# Patient Record
Sex: Female | Born: 1935 | Race: White | Hispanic: No | Marital: Married | Smoking: Never smoker
Health system: Southern US, Community
[De-identification: ages and names within clinical notes are randomized; demographics above are authoritative.]

## PROBLEM LIST (undated history)

## (undated) DIAGNOSIS — R112 Nausea with vomiting, unspecified: Secondary | ICD-10-CM

## (undated) DIAGNOSIS — C50919 Malignant neoplasm of unspecified site of unspecified female breast: Secondary | ICD-10-CM

## (undated) DIAGNOSIS — D510 Vitamin B12 deficiency anemia due to intrinsic factor deficiency: Secondary | ICD-10-CM

## (undated) DIAGNOSIS — E119 Type 2 diabetes mellitus without complications: Secondary | ICD-10-CM

## (undated) DIAGNOSIS — N183 Chronic kidney disease, stage 3 unspecified: Secondary | ICD-10-CM

## (undated) DIAGNOSIS — E7800 Pure hypercholesterolemia, unspecified: Secondary | ICD-10-CM

## (undated) DIAGNOSIS — G2581 Restless legs syndrome: Secondary | ICD-10-CM

## (undated) DIAGNOSIS — I6529 Occlusion and stenosis of unspecified carotid artery: Secondary | ICD-10-CM

## (undated) DIAGNOSIS — I10 Essential (primary) hypertension: Secondary | ICD-10-CM

## (undated) DIAGNOSIS — J45909 Unspecified asthma, uncomplicated: Secondary | ICD-10-CM

## (undated) DIAGNOSIS — G479 Sleep disorder, unspecified: Secondary | ICD-10-CM

## (undated) DIAGNOSIS — G629 Polyneuropathy, unspecified: Secondary | ICD-10-CM

## (undated) DIAGNOSIS — Z9889 Other specified postprocedural states: Secondary | ICD-10-CM

## (undated) DIAGNOSIS — M1990 Unspecified osteoarthritis, unspecified site: Secondary | ICD-10-CM

## (undated) DIAGNOSIS — I340 Nonrheumatic mitral (valve) insufficiency: Secondary | ICD-10-CM

## (undated) DIAGNOSIS — T8859XA Other complications of anesthesia, initial encounter: Secondary | ICD-10-CM

## (undated) DIAGNOSIS — T4145XA Adverse effect of unspecified anesthetic, initial encounter: Secondary | ICD-10-CM

## (undated) HISTORY — DX: Essential (primary) hypertension: I10

## (undated) HISTORY — DX: Unspecified osteoarthritis, unspecified site: M19.90

## (undated) HISTORY — PX: MASTECTOMY: SHX3

## (undated) HISTORY — DX: Chronic kidney disease, stage 3 unspecified: N18.30

## (undated) HISTORY — DX: Type 2 diabetes mellitus without complications: E11.9

## (undated) HISTORY — DX: Nonrheumatic mitral (valve) insufficiency: I34.0

## (undated) HISTORY — DX: Pure hypercholesterolemia, unspecified: E78.00

## (undated) HISTORY — PX: OTHER SURGICAL HISTORY: SHX169

## (undated) HISTORY — DX: Vitamin B12 deficiency anemia due to intrinsic factor deficiency: D51.0

## (undated) HISTORY — DX: Malignant neoplasm of unspecified site of unspecified female breast: C50.919

## (undated) HISTORY — DX: Restless legs syndrome: G25.81

## (undated) HISTORY — PX: TONSILLECTOMY: SUR1361

## (undated) HISTORY — DX: Occlusion and stenosis of unspecified carotid artery: I65.29

## (undated) HISTORY — DX: Chronic kidney disease, stage 3 (moderate): N18.3

---

## 1989-03-29 DIAGNOSIS — C50919 Malignant neoplasm of unspecified site of unspecified female breast: Secondary | ICD-10-CM

## 1989-03-29 HISTORY — DX: Malignant neoplasm of unspecified site of unspecified female breast: C50.919

## 1998-05-05 ENCOUNTER — Other Ambulatory Visit: Admission: RE | Admit: 1998-05-05 | Discharge: 1998-05-05 | Payer: Self-pay | Admitting: Family Medicine

## 1998-11-14 ENCOUNTER — Encounter: Admission: RE | Admit: 1998-11-14 | Discharge: 1999-02-12 | Payer: Self-pay | Admitting: Family Medicine

## 1999-08-18 ENCOUNTER — Encounter: Admission: RE | Admit: 1999-08-18 | Discharge: 1999-08-18 | Payer: Self-pay | Admitting: Family Medicine

## 1999-08-18 ENCOUNTER — Encounter: Payer: Self-pay | Admitting: Family Medicine

## 1999-10-08 ENCOUNTER — Encounter: Admission: RE | Admit: 1999-10-08 | Discharge: 1999-10-08 | Payer: Self-pay | Admitting: Family Medicine

## 1999-10-08 ENCOUNTER — Encounter: Payer: Self-pay | Admitting: Family Medicine

## 2000-07-13 ENCOUNTER — Encounter: Payer: Self-pay | Admitting: Family Medicine

## 2000-07-13 ENCOUNTER — Encounter: Admission: RE | Admit: 2000-07-13 | Discharge: 2000-07-13 | Payer: Self-pay | Admitting: Family Medicine

## 2000-10-18 ENCOUNTER — Encounter: Payer: Self-pay | Admitting: Family Medicine

## 2000-10-18 ENCOUNTER — Encounter: Admission: RE | Admit: 2000-10-18 | Discharge: 2000-10-18 | Payer: Self-pay | Admitting: Family Medicine

## 2001-04-07 ENCOUNTER — Encounter: Payer: Self-pay | Admitting: Family Medicine

## 2001-04-07 ENCOUNTER — Encounter: Admission: RE | Admit: 2001-04-07 | Discharge: 2001-04-07 | Payer: Self-pay | Admitting: Family Medicine

## 2001-10-30 ENCOUNTER — Encounter: Admission: RE | Admit: 2001-10-30 | Discharge: 2001-10-30 | Payer: Self-pay | Admitting: Family Medicine

## 2001-10-30 ENCOUNTER — Encounter: Payer: Self-pay | Admitting: Family Medicine

## 2001-11-29 ENCOUNTER — Other Ambulatory Visit: Admission: RE | Admit: 2001-11-29 | Discharge: 2001-11-29 | Payer: Self-pay | Admitting: Family Medicine

## 2002-07-31 ENCOUNTER — Encounter: Payer: Self-pay | Admitting: Orthopedic Surgery

## 2002-08-06 ENCOUNTER — Inpatient Hospital Stay (HOSPITAL_COMMUNITY): Admission: RE | Admit: 2002-08-06 | Discharge: 2002-08-10 | Payer: Self-pay | Admitting: Orthopedic Surgery

## 2003-02-11 ENCOUNTER — Ambulatory Visit (HOSPITAL_COMMUNITY): Admission: RE | Admit: 2003-02-11 | Discharge: 2003-02-11 | Payer: Self-pay | Admitting: Ophthalmology

## 2003-05-15 ENCOUNTER — Encounter: Admission: RE | Admit: 2003-05-15 | Discharge: 2003-05-15 | Payer: Self-pay | Admitting: Cardiology

## 2004-03-04 ENCOUNTER — Encounter: Admission: RE | Admit: 2004-03-04 | Discharge: 2004-03-04 | Payer: Self-pay | Admitting: Family Medicine

## 2004-07-17 ENCOUNTER — Encounter: Admission: RE | Admit: 2004-07-17 | Discharge: 2004-07-17 | Payer: Self-pay | Admitting: Family Medicine

## 2005-03-30 ENCOUNTER — Other Ambulatory Visit: Admission: RE | Admit: 2005-03-30 | Discharge: 2005-03-30 | Payer: Self-pay | Admitting: Family Medicine

## 2005-06-08 ENCOUNTER — Encounter: Admission: RE | Admit: 2005-06-08 | Discharge: 2005-06-08 | Payer: Self-pay | Admitting: Family Medicine

## 2005-06-23 ENCOUNTER — Ambulatory Visit: Payer: Self-pay | Admitting: Internal Medicine

## 2005-07-30 ENCOUNTER — Ambulatory Visit: Payer: Self-pay | Admitting: Internal Medicine

## 2006-05-26 ENCOUNTER — Encounter: Admission: RE | Admit: 2006-05-26 | Discharge: 2006-05-26 | Payer: Self-pay | Admitting: Family Medicine

## 2006-11-09 ENCOUNTER — Encounter: Admission: RE | Admit: 2006-11-09 | Discharge: 2006-11-09 | Payer: Self-pay | Admitting: Cardiology

## 2007-05-08 ENCOUNTER — Encounter: Admission: RE | Admit: 2007-05-08 | Discharge: 2007-05-08 | Payer: Self-pay | Admitting: Family Medicine

## 2007-05-10 ENCOUNTER — Encounter: Admission: RE | Admit: 2007-05-10 | Discharge: 2007-05-10 | Payer: Self-pay | Admitting: Cardiology

## 2007-08-14 ENCOUNTER — Other Ambulatory Visit: Admission: RE | Admit: 2007-08-14 | Discharge: 2007-08-14 | Payer: Self-pay | Admitting: Family Medicine

## 2007-08-14 ENCOUNTER — Encounter: Admission: RE | Admit: 2007-08-14 | Discharge: 2007-08-14 | Payer: Self-pay | Admitting: Family Medicine

## 2007-10-11 ENCOUNTER — Encounter: Admission: RE | Admit: 2007-10-11 | Discharge: 2007-10-11 | Payer: Self-pay | Admitting: Specialist

## 2008-12-08 ENCOUNTER — Encounter: Admission: RE | Admit: 2008-12-08 | Discharge: 2008-12-08 | Payer: Self-pay | Admitting: Specialist

## 2009-02-04 ENCOUNTER — Encounter (HOSPITAL_COMMUNITY): Admission: RE | Admit: 2009-02-04 | Discharge: 2009-03-28 | Payer: Self-pay | Admitting: Family Medicine

## 2009-02-27 ENCOUNTER — Ambulatory Visit: Payer: Self-pay | Admitting: Vascular Surgery

## 2009-05-15 ENCOUNTER — Ambulatory Visit (HOSPITAL_COMMUNITY): Admission: RE | Admit: 2009-05-15 | Discharge: 2009-05-15 | Payer: Self-pay | Admitting: Nephrology

## 2009-05-28 ENCOUNTER — Encounter: Admission: RE | Admit: 2009-05-28 | Discharge: 2009-05-28 | Payer: Self-pay | Admitting: Family Medicine

## 2009-09-02 ENCOUNTER — Ambulatory Visit: Payer: Self-pay | Admitting: Vascular Surgery

## 2010-04-20 ENCOUNTER — Encounter
Admission: RE | Admit: 2010-04-20 | Discharge: 2010-04-20 | Payer: Self-pay | Source: Home / Self Care | Attending: Family Medicine | Admitting: Family Medicine

## 2010-04-22 ENCOUNTER — Inpatient Hospital Stay (HOSPITAL_COMMUNITY)
Admission: EM | Admit: 2010-04-22 | Discharge: 2010-04-25 | Payer: Self-pay | Source: Home / Self Care | Attending: Internal Medicine | Admitting: Internal Medicine

## 2010-04-22 LAB — BASIC METABOLIC PANEL
CO2: 22 mEq/L (ref 19–32)
Calcium: 9.4 mg/dL (ref 8.4–10.5)
Creatinine, Ser: 1.31 mg/dL — ABNORMAL HIGH (ref 0.4–1.2)
GFR calc Af Amer: 47 mL/min — ABNORMAL LOW (ref >60)
GFR calc non Af Amer: 39 mL/min — ABNORMAL LOW (ref >60)
Sodium: 136 mEq/L (ref 135–145)

## 2010-04-22 LAB — HEPATIC FUNCTION PANEL
AST: 19 U/L (ref 0–37)
Albumin: 2.6 g/dL — ABNORMAL LOW (ref 3.5–5.2)
Bilirubin, Direct: 0.2 mg/dL (ref 0.0–0.3)
Total Bilirubin: 0.6 mg/dL (ref 0.3–1.2)

## 2010-04-22 LAB — CBC
Hemoglobin: 11.4 g/dL — ABNORMAL LOW (ref 12.0–15.0)
MCH: 27 pg (ref 26.0–34.0)
Platelets: 393 10*3/uL (ref 150–400)
RBC: 4.23 MIL/uL (ref 3.87–5.11)
WBC: 17.6 10*3/uL — ABNORMAL HIGH (ref 4.0–10.5)

## 2010-04-22 LAB — GLUCOSE, CAPILLARY
Glucose-Capillary: 328 mg/dL — ABNORMAL HIGH (ref 70–99)
Glucose-Capillary: 199 mg/dL — ABNORMAL HIGH (ref 70–99)

## 2010-04-22 LAB — DIFFERENTIAL
Basophils Absolute: 0 10*3/uL (ref 0.0–0.1)
Eosinophils Relative: 4 % (ref 0–5)
Lymphocytes Relative: 11 % — ABNORMAL LOW (ref 12–46)
Monocytes Relative: 7 % (ref 3–12)
Neutrophils Relative %: 78 % — ABNORMAL HIGH (ref 43–77)
Smear Review: ADEQUATE

## 2010-04-23 LAB — GLUCOSE, CAPILLARY: Glucose-Capillary: 154 mg/dL — ABNORMAL HIGH (ref 70–99)

## 2010-04-23 LAB — CBC
HCT: 29.5 % — ABNORMAL LOW (ref 36.0–46.0)
MCHC: 32.5 g/dL (ref 30.0–36.0)
MCV: 80.8 fL (ref 78.0–100.0)
Platelets: 401 10*3/uL — ABNORMAL HIGH (ref 150–400)
RDW: 14 % (ref 11.5–15.5)

## 2010-04-23 LAB — HEMOGLOBIN A1C
Hgb A1c MFr Bld: 7.2 % — ABNORMAL HIGH (ref <5.7)
Mean Plasma Glucose: 160 mg/dL — ABNORMAL HIGH (ref <117)

## 2010-04-23 LAB — BASIC METABOLIC PANEL
BUN: 17 mg/dL (ref 6–23)
Calcium: 8.9 mg/dL (ref 8.4–10.5)
Creatinine, Ser: 1.24 mg/dL — ABNORMAL HIGH (ref 0.4–1.2)
GFR calc non Af Amer: 42 mL/min — ABNORMAL LOW (ref >60)
Glucose, Bld: 153 mg/dL — ABNORMAL HIGH (ref 70–99)

## 2010-04-23 LAB — MAGNESIUM: Magnesium: 1.9 mg/dL (ref 1.5–2.5)

## 2010-04-24 LAB — BASIC METABOLIC PANEL
Calcium: 8.6 mg/dL (ref 8.4–10.5)
GFR calc Af Amer: 51 mL/min — ABNORMAL LOW (ref >60)
GFR calc non Af Amer: 42 mL/min — ABNORMAL LOW (ref >60)
Potassium: 4.5 mEq/L (ref 3.5–5.1)
Sodium: 139 mEq/L (ref 135–145)

## 2010-04-24 LAB — GLUCOSE, CAPILLARY
Glucose-Capillary: 141 mg/dL — ABNORMAL HIGH (ref 70–99)
Glucose-Capillary: 140 mg/dL — ABNORMAL HIGH (ref 70–99)
Glucose-Capillary: 281 mg/dL — ABNORMAL HIGH (ref 70–99)

## 2010-04-24 LAB — IRON AND TIBC
Iron: 21 ug/dL — ABNORMAL LOW (ref 42–135)
Saturation Ratios: 11 % — ABNORMAL LOW (ref 20–55)
TIBC: 186 ug/dL — ABNORMAL LOW (ref 250–470)

## 2010-04-24 LAB — CBC
Hemoglobin: 9.6 g/dL — ABNORMAL LOW (ref 12.0–15.0)
RBC: 3.61 MIL/uL — ABNORMAL LOW (ref 3.87–5.11)

## 2010-04-24 NOTE — H&P (Addendum)
[name], [name] NO.:  [id number]  MEDICAL RECORD NO.:  [id number]          PATIENT TYPE:  EMS  LOCATION:  MAJO                         FACILITY:  [facility]  PHYSICIAN:  Vania Rea, M.D. DATE OF BIRTH:  1926/09/25  DATE OF ADMISSION:  04/22/2010 DATE OF DISCHARGE:                             HISTORY & PHYSICAL   PRIMARY CARE PHYSICIAN:  Dr. Lupita Raider.  CHIEF COMPLAINT:  Persistent cough.  HISTORY OF PRESENT ILLNESS:  This is an 77 year old Caucasian lady with history of diabetes and hypertension, who has been having cough and shortness of breath for the past 4 to 5 days.  She visited her primary care physician 3 days ago, was started on Zithromax, but has been getting progressively worse and presented to the Emergency Room today for reevaluation.  The patient had a chest x-ray done 2 days ago, which showed patchy air space disease bilaterally is suggestive of pneumonia. Today, the patient had a repeat chest x-ray, which shows worsening of pneumonia like consolidation.  The patient's shortness of breath and cough is accompanied by a left lower chest pleuritic pain and she has difficulty expectorating anything.  She has been also getting progressively weak, notes that her blood sugars have been going out of control.  There is no current family illness or history of sickness.  A few years ago, she did have an episode of pneumonia, which took a long time to clear.  PAST MEDICAL HISTORY:  Diabetes, hypertension, diabetic neuropathy, diabetic nephropathy, hyperlipidemia, subcritical carotid artery stenosis.  MEDICATIONS: 1. Simvastatin 20 mg daily. 2. Lopressor 25 mg twice daily. 3. Diovan 160 mg twice daily. 4. Lotrel 10/40 one tablet twice daily. 5. Clonidine 0.3 mg 1/2 tablet twice daily. 6. Lasix 80 mg each morning. 7. Potassium 20 mEq daily. 8. Metformin 1000 mg twice daily. 9. Novolin N 30 units each morning. 10.Aspirin 81 mg  daily. 11.Vitamin D 2000 units daily. 12.Citracal maximum daily. 13.Hydroxyzine 25 mg at bedtime as necessary. 14.Neurontin 100 mg 3 times daily. 15.Vitamin B12 injections 1000 mcg monthly.  ALLERGIES:  SHELLFISH.  SOCIAL HISTORY:  No history of tobacco, alcohol, or illicit drug use. 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  FAMILY HISTORY:  Significant for mother who had diabetes.  Her father who had lymphoma.  Brother who had coronary artery disease, but now deceased from trauma.  A sister who had a brain tumor and cardiac arrhythmia with pacemaker.  Another sister with Parkinson disease.  REVIEW OF SYSTEMS:  Other than noted above significant only for chronic essential tremor involving her face and sometimes her hands, which gets worse when she is sick.  Other than this a complete review of systems is unremarkable.  PHYSICAL EXAMINATION:  GENERAL:  A very pleasant elderly Caucasian lady, sitting up in the stretcher, looking somewhat younger than her stated age, struggling to breathe somewhat. VITAL SIGNS:  Her temperature is 98.3, pulse 95, respiration 20, blood pressure 123/81.  She is saturating at 97% on room air. HEENT:  Her pupils are round  and equal.  Mucous membranes pink. Anicteric. NECK:  No cervical lymphadenopathy or thyromegaly.  No carotid bruit. No jugular venous distention. CHEST:  She has coarse crackles at both bases bilaterally. CARDIOVASCULAR:  Regular rhythm.  No murmur. ABDOMEN:  Obese, soft and nontender.  No masses. EXTREMITIES: She has arthritic deformities of the hands and knees. Dorsalis pedis pulses are 2+ bilaterally. SKIN:  Warm and dry. CENTRAL NERVOUS SYSTEM: Cranial nerves II through XII are grossly intact.  She has no focal lateralizing signs.  LABORATORY DATA:  Her white count is elevated 17.6, hemoglobin is low at 11.4, platelets 393.  Absolute  neutrophil count is 13.8.  Toxic granulations are noted on the smear.  Lactic acid is normal at 1.6.  The sodium is 136, potassium 4.6, chloride 102, CO2 of 22.  Her glucose is elevated 344, BUN 18, creatinine is 1.3.  Her calcium is 9.4.  Liver functions show an albumin of 2.6, but is otherwise unremarkable.  Her total protein is 8.1, which is somewhat elevated considering her albumin.  Chest x-ray done today compared to the chest x-ray done 2 days ago shows a persistent right upper and left lobe airspace disease, worrisome for pneumonia.  No interval change.  ASSESSMENT: 1. Community-acquired bilobar pneumonia, failed outpatient     therapy. 2. Pleurisy. 3. Hypertension. 4. Diabetes type 2, uncontrolled. 5. Diabetic nephropathy. 6. Diabetic neuropathy. 7. History of carotid artery stenosis. 8. Hyperlipidemia. 9. Anemia with elevated globulin-albumin ratio.  PLAN:  We will admit this lady for inpatient management of her pneumonia.  She is already received vancomycin and Zosyn, but we will continue treatment with Avelox and the mucolytics.  We will attempt to get a sputum culture. We will continue her outpatient antidiabetic treatment and we will add a sensitive sliding scale.  Because of her anemia and elevated BUN creatinine ratio, we will go ahead and get a serum protein electrophoresis since the dehydration with a ratio should have been maintained.  Other plans as per orders.     Vania Rea, M.D.     LC/MEDQ  D:  04/22/2010  T:  04/22/2010  Job:  [id number]  cc:   Lupita Raider, M.D.  Electronically Signed by Vania Rea M.D. on 04/24/2010 03:18:32 AM

## 2010-04-25 LAB — BASIC METABOLIC PANEL
Chloride: 107 mEq/L (ref 96–112)
Creatinine, Ser: 1.27 mg/dL — ABNORMAL HIGH (ref 0.4–1.2)
GFR calc Af Amer: 49 mL/min — ABNORMAL LOW (ref >60)
Potassium: 5.2 mEq/L — ABNORMAL HIGH (ref 3.5–5.1)

## 2010-04-25 LAB — GLUCOSE, CAPILLARY: Glucose-Capillary: 133 mg/dL — ABNORMAL HIGH (ref 70–99)

## 2010-04-25 LAB — CBC
MCH: 26.3 pg (ref 26.0–34.0)
Platelets: 419 10*3/uL — ABNORMAL HIGH (ref 150–400)
RBC: 3.5 MIL/uL — ABNORMAL LOW (ref 3.87–5.11)
WBC: 12.2 10*3/uL — ABNORMAL HIGH (ref 4.0–10.5)

## 2010-04-27 LAB — PROTEIN ELECTROPH W RFLX QUANT IMMUNOGLOBULINS
Alpha-2-Globulin: 22.2 % — ABNORMAL HIGH (ref 7.1–11.8)
Beta Globulin: 6.9 % (ref 4.7–7.2)
Gamma Globulin: 16.4 % (ref 11.1–18.8)
M-Spike, %: NOT DETECTED g/dL

## 2010-04-27 LAB — IGG, IGA, IGM: IgG (Immunoglobin G), Serum: 1170 mg/dL (ref 694–1618)

## 2010-04-28 LAB — CULTURE, BLOOD (ROUTINE X 2)
Culture  Setup Time: 201201252009
Culture  Setup Time: 201201252009
Culture: NO GROWTH

## 2010-05-10 NOTE — Discharge Summary (Signed)
[name], [name] NO.:  [id number]  MEDICAL RECORD NO.:  [id number]          PATIENT TYPE:  INP  LOCATION:  5126                         FACILITY:  [facility]  PHYSICIAN:  Brendia Sacks, MD    DATE OF BIRTH:  26-Oct-1926  DATE OF ADMISSION:  04/22/2010 DATE OF DISCHARGE:  04/25/2010                              DISCHARGE SUMMARY   PRIMARY CARE PHYSICIAN:  Lupita Raider, M.D.  CONDITION ON DISCHARGE:  Improved.  DISCHARGE DIAGNOSES: 1. Community-acquired pneumonia. 2. Chronic kidney disease, stable. 3. Diabetes mellitus type 2, uncontrolled by hemoglobin A1c; stable as     an inpatient. 4. Anemia suggestive of chronic disease with possibility of     concomitant iron deficiency, stable. 5. Hypertension, stable. 6. Mild hyperkalemia.  HISTORY OF PRESENT ILLNESS:  This is an 77 year old woman who presented to the emergency room for reevaluation of shortness of breath.  She had been recently seen by her primary care physician and started on antibiotics, but had progressively worsened.  HOSPITAL COURSE: 1. Community-acquired pneumonia.  The patient was admitted to medical     floor, started on IV Avelox.  She has had rapid improvement in her     symptoms.  Has been ambulating without difficulty and has no     hypoxia.  She is now stable for discharge. 2. Chronic kidney disease.  Last comparison laboratory studies are     from 2004.  She is followed by Dr. Darrick Penna and can continue to     follow up with him in the outpatient setting.  She had been on both     an ACE inhibitor and an ARB as well as Lasix for some time. 3. Mild hyperkalemia.  I have asked her to hold her potassium and ARB.     She can resume her ARB in the next few days, and it was suggested     to repeat basic metabolic panel prior to restarting her potassium. 4. Diabetes mellitus type 2, uncontrolled by hemoglobin A1c.  This has     remained stable during this hospitalization from a  capillary blood     sugar standpoint.  She will continue on NPH.  I have discontinued     her metformin as in patients over 80 have increased risk of lactic     acidosis. 5. Anemia.  Laboratory studies mixed, suggesting chronic disease and     potentially iron deficiency.  Place her on empiric iron and have     her follow up in the outpatient setting. 6. Hypertension has remained stable.  PROCEDURES:  None.  IMAGING:  Chest x-ray, January 25:  Persistent right upper and right lower lobe airspace disease, worrisome for pneumonia.  No interval change.  MICROBIOLOGY:  Blood cultures x2, January 25:  No growth to date.  PERTINENT LABORATORY STUDIES: 1. Hemoglobin A1c 7.2. 2. CBC notable for white blood cell count 17.6 on admission, on     discharge 12.2 and trending down.  Hemoglobin stable at 9.2.     Review of her record does demonstrate a history of anemia dating     back to  at least 2004. 3. Capillary blood sugars under fair control. 4. Basic metabolic panel notable for potassium of 5.2 on discharge,     creatinine is stable at 1.27, and BUN is within normal limits.  OBJECTIVE:  GENERAL:  On exam at discharge, patient is feeling well.  No complaints.  She would like to go home. VITAL SIGNS:  Afebrile, temperature is 97.9, pulse 64, respirations 24, blood pressure 144/53, and satting 91% on room air.  CARDIOVASCULAR: Regular rate and rhythm.  No murmur, rub, or gallop. RESPIRATORY:  Clear to auscultation bilaterally.  No wheezes, rales, or rhonchi.  Normal respiratory effort.  No lower extremity edema.  DISCHARGE INSTRUCTIONS:  The patient will be discharged home today.  She has been evaluated by Physical Therapy, and it has been recommended she have outpatient physical therapy and a rolling walker for discharge.  DIET:  Heart-healthy diabetic diet.  FOLLOWUP:  With primary care physician, Dr. Cam Hai in about 7 or 10 days.  DISCHARGE MEDICATIONS: 1. Avelox 400 mg,  to complete a 10-day course. 2. Iron 325 mg p.o. daily. 3. Aspirin 81 mg p.o. daily. 4. Clonidine 0.3 mg one-half tablet p.o. daily. 5. Hydrochlorothiazide 25 mg p.o. daily. 6. Lasix 80 mg p.o. daily. 7. Lopressor 25 mg p.o. b.i.d. 8. Lotrel 10/40 p.o. b.i.d. 9. Neurontin 100 mg p.o. t.i.d. 10.Novolin N 30 units subcu daily. 11.Potassium chloride, on hold until followup with primary care     physician. 12.Simvastatin 20 mg p.o. daily. 13.Vitamin B12 injections monthly. 14.Vitamin D 2000 units daily.  DISCONTINUED MEDICATIONS: 1. Metformin, see rationale above. 2. Potassium chloride, see rationale above. 3. Valsartan, until followup with primary care physician.  THINGS TO FOLLOW UP IN THE OUTPATIENT SETTING: 1. Suggest repeat basic metabolic panel and to continue following her     potassium and kidney functions as     indicated. 2. Continue diabetic care.  Time coordinating discharge is 30 minutes.     Brendia Sacks, MD     DG/MEDQ  D:  04/25/2010  T:  04/26/2010  Job:  [id number]  cc:   Lupita Raider, M.D.  Electronically Signed by Brendia Sacks  on 05/10/2010 03:57:39 PM

## 2010-05-19 ENCOUNTER — Other Ambulatory Visit: Payer: Self-pay | Admitting: Family Medicine

## 2010-05-19 ENCOUNTER — Ambulatory Visit
Admission: RE | Admit: 2010-05-19 | Discharge: 2010-05-19 | Disposition: A | Discharge status: Home / Self Care | Payer: Medicare Other | Source: Ambulatory Visit | Attending: Family Medicine | Admitting: Family Medicine

## 2010-05-19 DIAGNOSIS — R52 Pain, unspecified: Secondary | ICD-10-CM

## 2010-06-05 ENCOUNTER — Other Ambulatory Visit: Payer: Self-pay | Admitting: Family Medicine

## 2010-06-05 ENCOUNTER — Ambulatory Visit
Admission: RE | Admit: 2010-06-05 | Discharge: 2010-06-05 | Disposition: A | Discharge status: Home / Self Care | Payer: Medicare Other | Source: Ambulatory Visit | Attending: Family Medicine | Admitting: Family Medicine

## 2010-06-05 DIAGNOSIS — J189 Pneumonia, unspecified organism: Secondary | ICD-10-CM

## 2010-08-11 NOTE — Assessment & Plan Note (Signed)
OFFICE VISIT   [name], [name]  DOB:  1926-05-07                                       09/02/2009  JXBJY#:[id number]   CHIEF COMPLAINT:  Follow-up mild to moderate carotid stenosis.   Patient is an 77 year old female with known moderate bilateral carotid  stenosis which has been asymptomatic.  She has not had any surgical  procedures of her carotids.  She has a history of diabetes and high  blood pressure.   All medications were reviewed from the list that she brought with her  today from a visit with her primary care physician 1 month ago.   PHYSICAL EXAMINATION:  A well-developed, well-nourished woman in no  acute distress.  Her heart rate was 65.  Her sats were 97%, and her  respiratory rate was 10.  She was alert and oriented x3.  She had good  equal strength in bilateral upper and lower extremities.  She had no  tongue deviation.  No facial droop.  Her speech was normal.   Six-month carotid studies showed end-diastolic volume of 33 on the right  and 60 on the left with a 40% to 59% stenosis on the right and a 60% and  79% stenosis on the left.  Left is slightly higher from a previous  study.   ASSESSMENT:  Bilateral moderate carotid stenosis, left end-diastolic  volume slightly higher, with no symptoms of stroke.   PLAN:  Follow up in 6 months with repeat vascular lab studies.   Della Goo, PA-C   Quita Skye. Hart [place], M.D.  Electronically Signed   RR/MEDQ  D:  09/02/2009  T:  09/02/2009  Job:  [id number]

## 2010-08-11 NOTE — Consult Note (Signed)
NEW PATIENT CONSULTATION   [name], [name]  DOB:  [birth year]                                       02/27/2009  IRJJO#:[id number]   I saw the patient in the office today in consultation concerning  bilateral carotid disease.  She was referred by Dr. Mayford Knife.  This is a  pleasant 77 year old woman who was found to have carotid bruits and this  prompted a carotid duplex scan which showed moderate bilateral carotid  stenoses.  Of note, she is right-handed.  She denies any history of  stroke, TIAs, expressive or receptive aphasia or amaurosis fugax.   PAST MEDICAL HISTORY:  Significant for diabetes and hypertension, both  of which are stable on her current medications.  In addition, she has  hypercholesterolemia which is well-controlled on her Zocor.  She denies  any history of previous myocardial infarction, history of congestive  heart failure or history of COPD.   PAST SURGICAL HISTORY:  Significant for recent right rotator cuff  surgery by Dr. Hayden Rasmussen.  She has also had previous right knee  surgery in approximately 2005.   FAMILY HISTORY:  There is no history of premature cardiovascular  disease.   SOCIAL HISTORY:  She is married.  She has 4 children.  She does not use  tobacco.  She does not use alcohol on a regular basis.   MEDICATIONS:  Are documented on the medical history form in her chart.   ALLERGIES:  She has no known drug allergies.   REVIEW OF SYSTEMS:  CARDIAC:  She has had no chest pain, chest pressure,  palpitations or arrhythmias.  She does admit to some mild dyspnea on  exertion.  PULMONARY:  She has had no productive cough, bronchitis, asthma or  wheezing.  NEUROLOGIC:  She has had no dizziness, blackouts, headaches or seizures.  She does state that she has a neuropathy in her right foot.  MUSCULOSKELETAL:  She does have some arthritis.  General, GI, GU, vascular, psychiatric, ENT, hematologic review of  systems is unremarkable and  is documented on the medical history form in  her chart.   PHYSICAL EXAMINATION:  General:  This is a pleasant 77 year old woman  who appears her stated age.  Vital signs:  Blood pressure 146/56, heart  rate is 65 and temperature is 98.  HEENT:  Pupils are equal, round,  reactive to light and accommodation.  Extraocular motions are intact.  Conjunctivae are normal.  Lungs:  Are clear bilaterally to auscultation  without rales, rhonchi or wheezing.  Cardiovascular:  She has bilateral  carotid bruits.  She has a regular rate and rhythm.  She has a systolic  murmur.  She has mild peripheral edema.  She has palpable radial and  femoral pulses.  She has palpable posterior tibial pulses bilaterally.  Abdomen:  Soft, nontender.  I do not appreciate an aneurysm or any  masses.  She has normal pitched bowel sounds.  Musculoskeletal:  There  are no major deformities or cyanosis.  Neurological:  There is no focal  weakness or paresthesias.  Lymphatics:  There is no significant  cervical, axillary or inguinal lymphadenopathy.   I have reviewed her carotid duplex scan which by velocity criteria show  a 40%-59% carotid stenosis on the right in the lower end of the range,  on the left side there is a  40%-59% stenosis in the higher end of that  range.  She has antegrade flow in the right vertebral artery with  retrograde flow in the left vertebral artery.   With respect to her carotid disease I have explained that for the  asymptomatic patient we would normally not recommend carotid  endarterectomy unless the stenosis progressed to greater than 80% or she  developed new neurologic symptoms.  I have recommended a followup duplex  scan in 6 months and I will see her back at that time.  She does know to  continue taking her aspirin.  She does have evidence of retrograde flow  in the left vertebral artery.  We cannot compare arm pressures as she  did not want to have her pressure taken in the right arm  because of  recent shoulder surgery.  However, this appears to be asymptomatic and I  would not recommend further workup for this unless she developed  problems with dizziness or left arm symptoms.   I will see her back in 6 months.  She knows to call sooner if she has  problems.  In the meantime she knows to continue taking her aspirin.   Di Kindle. Edilia Bo, M.D.  Electronically Signed   CSD/MEDQ  D:  02/27/2009  T:  02/28/2009  Job:  [id number]   cc:   Armanda Magic, M.D.

## 2010-08-11 NOTE — Procedures (Signed)
CAROTID DUPLEX EXAM   INDICATION:  Known carotid disease.   HISTORY:  Diabetes:  Yes.  Cardiac:  No.  Hypertension:  Yes.  Smoking:  No.  Previous Surgery:  No.  CV History:  Asymptomatic.  Amaurosis Fugax No, Paresthesias No, Hemiparesis No.                                       RIGHT             LEFT  Brachial systolic pressure:         170               170  Brachial Doppler waveforms:         Normal            Normal  Vertebral direction of flow:        Antegrade         Antegrade  DUPLEX VELOCITIES (cm/sec)  CCA peak systolic                   118               56  ECA peak systolic                   122               43  ICA peak systolic                   156               297  ICA end diastolic                   33                60  PLAQUE MORPHOLOGY:                  Calcific          Calcific  PLAQUE AMOUNT:                      Moderate          Moderate-to-severe  PLAQUE LOCATION:                    ICA, ECA          ICA, ECA   IMPRESSION:  1. The right Doppler velocities suggest 40% to 59% stenosis in the      right internal carotid artery.  2. Left Doppler velocities suggest 60% to 79% stenosis in the left      internal carotid artery.  3. Antegrade flow in bilateral vertebral arteries.   ___________________________________________  Quita Skye Hart [place], M.D.   NT/MEDQ  D:  09/02/2009  T:  09/02/2009  Job:  [id number]

## 2010-08-14 NOTE — Discharge Summary (Signed)
[name], [name]                         ACCOUNT NO.:  [id number]   MEDICAL RECORD NO.:  [id number]                   PATIENT TYPE:  INP   LOCATION:  0474                                 FACILITY:  [facility]   PHYSICIAN:  Ollen Gross, M.D.                 DATE OF BIRTH:  06/08/1926   DATE OF ADMISSION:  08/06/2002  DATE OF DISCHARGE:  08/10/2002                                 DISCHARGE SUMMARY   ADMITTING DIAGNOSES:  1. Osteoarthritis right knee.  2. Hypertension.  3. Hypercholesterolemia.  4. Osteoarthritis.  5. History of breast cancer.  6. Non-insulin-dependent diabetes mellitus.   DISCHARGE DIAGNOSES:  1. Osteoarthritis right knee status post right total knee replacement     arthroplasty.  2. Postoperative blood loss hemorrhagic anemia.  3. Status post transfusion without sequelae.  4. Postoperative hyponatremia, improved.  5. Hypertension.  6. Hypercholesterolemia.  7. Osteoarthritis.  8. History of breast cancer.  9. Non-insulin-dependent diabetes mellitus.  10.      Urinary tract infection.   PROCEDURE:  The patient was taken to the OR on Aug 06, 2002 and underwent a  right total knee arthroplasty.  Surgeon Ollen Gross, M.D.  Assistant  Alexzandrew L. Julien Girt, P.A.  Surgery under general anesthesia.  Minimal  blood loss.  Hemovac drain x1.  Tourniquet time 40 minutes at 300 mmHg.   CONSULTS:  Medicine consult, Donia Guiles, M.D.   BRIEF HISTORY:  The patient is a 77 year old female with a known history of  arthritis that has been seen by Ollen Gross, M.D. for ongoing knee pain.  She is found in the office to have end-stage osteoarthritis which has been  refractory to non-operative management including injections.  She presents  now for total knee replacement arthroplasty.   LABORATORY DATA:  CBC on admission hemoglobin 11.6, hematocrit 35.0, white  cell count 7.9, red cell count 3.17.  Differential all within normal limits.  Postoperative H&H 10.0  and 29.9.  Hemoglobin continued to drop down to 7.7  and 23.1.  Given blood.  Post transfusion hemoglobin 11.4 and 33.4.  PT/PTT  on admission 14.5 and 37, respectively.  INR 1.1.  Serial pro times  followed.  Last noted PT/INR 19.7 and 1.8.  Chemistry panel on admission:  Elevated glucose 142.  Remaining chemistry panel all within normal limits.  Serial BMETs were followed.  Sodium did drop down from 143 and 132 back up  to 136.  Glucose went up from 142 to 229, back down to 136.  BUN went from  22 to 27 back down to 9.  Urinalysis on admission was negative.  Follow-up  UA showed only small leukocyte esterase with only small hemoglobin.  Urine  culture taken on Aug 09, 2002 showed serratia marcescens.  Sensitivities in  chart.   EKG dated Aug 06, 2002:  Sinus rhythm with first degree AV block, prolonged  QT.  No old  tracings to compare.  Confirmed by Lyn Records, M.D.  Chest x-  ray dated Jul 31, 2002:  Mild hyperinflation and chronic appearing changes as  detailed.  Postoperative changes and borderline cardiomegaly.   HOSPITAL COURSE:  The patient was admitted to [facility], taken to  the OR, underwent above stated procedure without complications.  The patient  tolerated procedure well.  Was later returned to recovery room, then to  orthopedic floor for continued postoperative care.  The patient's medical  doctor is Donia Guiles, M.D.  Medical consult called Donia Guiles, M.D.  in his office.  The patient was followed by one of Donia Guiles, M.D.  partners during the hospital course.  The patient was given 24 hours of  postoperative IV antibiotics, placed on Coumadin for DVT prophylaxis.  The  patient was followed by the [facility] hospitalists, Deirdre Peer. Polite, M.D.  Hemovac drain placed at time of surgery was pulled on postoperative day one.  She did have some postoperative nausea.  Was given antiemetics.  By day two  the nausea had nearly but all resolved.  She was  given PC analgesics  initially following her procedure.  She was weaned over to p.o. medications  by postoperative day two.  She had a Marcaine in-flow drain placed at time  of surgery.  This was pulled on postoperative day two.  Laboratories were  followed during the hospital course as above.  Unfortunately, hemoglobin  dropped down to 7.7 noted on postoperative day three.  Due to its low level  she was given 2 units of blood.  Hemoglobin came back up, responded well.  The patient was initially slow to progress with physical therapy up  ambulating only 5 feet by postoperative day one and 8 feet by postoperative  day two.  By postoperative day three, however, she was up ambulating much  better and had ambulated approximately 120 feet.  She was doing well, so  well the following day, postoperative day four, she was doing much better.  Improved with her therapy and it was decided the patient would be discharged  home at that time.   DISCHARGE PLAN:  The patient discharged home on Aug 10, 2002.   DISCHARGE DIAGNOSES:  Please see above.   DISCHARGE MEDICATIONS:  1. Mepergan Fortis.  2. Coumadin.  3. Robaxin.  4. Cipro for three days.   DIET:  Diabetic diet.   FOLLOW UP:  Follow up two to two and a half weeks from surgery.  Call the  office for an appointment.   DISPOSITION:  Home.   ACTIVITY:  Full weightbearing.  Home health PT, home health nursing through  [place].  Total knee protocol.   CONDITION ON DISCHARGE:  Improved.     Alexzandrew L. Julien Girt, P.A.              Ollen Gross, M.D.    ALP/MEDQ  D:  09/20/2002  T:  09/21/2002  Job:  [id number]

## 2010-08-14 NOTE — Op Note (Signed)
NAMEMARSHAYLA, [name]                         ACCOUNT NO.:  [id number]   MEDICAL RECORD NO.:  [id number]                   PATIENT TYPE:  OIB   LOCATION:  2550                                 FACILITY:  [facility]   PHYSICIAN:  Alford Highland. Rankin, M.D.                DATE OF BIRTH:  [birth year]   DATE OF PROCEDURE:  02/11/2003  DATE OF DISCHARGE:                                 OPERATIVE REPORT   PREOPERATIVE DIAGNOSES:  1. Dense vitreous hemorrhage, right eye.  2. Progressive diabetic retinopathy, right eye.   POSTOPERATIVE DIAGNOSES:  1. Dense vitreous hemorrhage, right eye.  2. Progressive diabetic retinopathy, right eye.  3. Posterior capsular opacity, right eye.   PROCEDURES:  1. Posterior vitrectomy, Endolaser pan photocoagulation, O.D.  2. Surgical posterior capsulotomy, O.D.   SURGEON:  Alford Highland. Rankin, M.D.   ANESTHESIA:  Local retrobulbar by ________.   INDICATIONS FOR PROCEDURE:  The patient is a 77 year old woman with profound  visual loss on the basis of dense vitreous hemorrhage.  This is an attempt  to remove the vitreous opacification and induce clysis of diabetic  retinopathy with laser photocoagulation.  The patient understands the risks  of anesthesia including rare occurrence of death, plus the eye including,  but not limited to hemorrhage, infection, scarring, need for further  surgery, change in vision, loss of vision, progressive disease, were  mentioned.  Appropriate signed consent was obtained.   DESCRIPTION OF PROCEDURE:  The patient was taken to the operating room.  In  the operating room, appropriate monitors followed by mild sedation was  performed via 1% Marcaine delivered 5 cc retrobulbar and additional 5 cc  laterally via modified Darel Hong.  The periocular region was thoroughly  prepped and draped in the usual ophthalmic fashion.  A lid speculum applied.  The conjunctival peritomy was the fashioned temporally and superonasally.  Appropriate infusion  cannula was secured 3.5 mm posterior to the limbus in  the inferotemporal quadrant.  Placement in vitreous cavity verified  visually.  A superior sclerotomy was then fashioned.  A Wilds microscope was  placed into position with BIOM attached. A core vitrectomy was then begun.  Vitreous scars were trimmed 360 degrees.  All preretinal vitreous hemorrhage  removed.  Endolaser photocoagulation placed peripherally in areas previously  not needing treatment.  It was necessary to create a surgical capsulotomy so  to allow enhanced viewing.  At this time, the instrument was removed from  the eye.  The superior sclerotomy was closed with 7-0 Vicryl suture.  The  conjunctiva was closed with 7-0 Vicryl.  Subconjunctival injections of  antibiotic and steroid were applied.  The patient tolerated the procedure  well without complications.  She was taken to the outpatient area and will  be discharged home as an outpatient.  Alford Highland Rankin, M.D.   GAR/MEDQ  D:  02/11/2003  T:  02/11/2003  Job:  [id number]

## 2010-08-14 NOTE — Op Note (Signed)
NAMEKYLI, SORTER                         ACCOUNT NO.:  [id number]   MEDICAL RECORD NO.:  [id number]                   PATIENT TYPE:  INP   LOCATION:  E454                                 FACILITY:  [facility]   PHYSICIAN:  Ollen Gross, M.D.                 DATE OF BIRTH:  [birth year]   DATE OF PROCEDURE:  08/06/2002  DATE OF DISCHARGE:                                 OPERATIVE REPORT   PREOPERATIVE DIAGNOSIS:  Osteoarthritis, right knee.   POSTOPERATIVE DIAGNOSIS:  Osteoarthritis, right knee.   PROCEDURE:  Right total knee arthroplasty.   SURGEON:  Ollen Gross, M.D.   ASSISTANT:  Alexzandrew L. Julien Girt, P.A.   ANESTHESIA:  General.   ESTIMATED BLOOD LOSS:  Minimal.   DRAINS:  Hemovac x1.   COMPLICATIONS:  None.   TOURNIQUET TIME:  40 minutes at 300 mmHg.   CONDITION:  Stable to recovery.   BRIEF CLINICAL NOTE:  [name] is a 77 year old female with end-stage  arthritis of the right knee with pain refractory to nonoperative management  including injections. She has had intractable pain and presents now for a  total knee arthroplasty.   DESCRIPTION OF PROCEDURE:  After successful administration of general  anesthetic, a tourniquet was placed high on the right thigh, right lower  extremity prepped and draped in the usual sterile fashion. Extremities  wrapped in Esmarch, knee flexed, tourniquet inflated to 300 mmHg. A standard  midline incision was made with a 10 blade through the subcutaneous tissue to  the level of the fascia lata which extended well over the extensor  mechanism. A medial parapatellar arthrotomy is made and the soft tissue over  the proximal medial tibia subperiosteally elevated to the joint line with a  knife into the semimembranosus bursa with a curved osteotome. The soft  tissue over the proximal lateral tibia is elevated with attention being paid  to avoiding the patellar tendon on the tibial tubercle. The patella was  everted, knee flexed 90  degrees and ACL and PCL removed.   A drill was used to create a starting hole in the distal femur and the canal  was irrigated. A five degree right valgus alignment guide is placed and  referencing off the posterior condyles rotations marked and a block pinned  to remove 10 mm off the distal femur. Distal femoral resection is made with  an oscillating saw. A sizing block is placed and a size 3 is the most  appropriate. Rotation corresponds with the epicondylar axis and then the  size 3 cutting block is placed and the anterior and posterior cuts made. The  tibia was then subluxed forward and the menisci removed. The extramedullary  tibial alignment guide is placed referencing proximally at the medial aspect  of the tibial tubercle and distally along the second metatarsal axis of the  tibial crest. The block is pinned to remove 10 mm off the  nondeficient  lateral side. Tibial resection is made with an oscillating saw. The proximal  tibia is then repaired with the modular drill and then keel punch with a  size 3.   Femoral preparation is completed with the intercondylar and chamfer cuts.  The size 3 posterior stabilized femoral trial, size 3 mobile bearing tibial  trial and a 10 mm posterior stabilized rotating platform insert trial are  placed. Full extension is achieved with excellent varus and valgus balance  throughout full range of motion. The patella is everted, thickness is  measured to be 24 mm, free hand resection taken to 14 mm, 38 template  placed, lug holes drilled, trial patella placed and it tracks normally.  Osteophytes are then removed off the posterior femur with the femoral trial  in place. All trials are then removed and the cut bone surfaces prepared  with pulsatile lavage. The cement is mixed and once ready for implantation,  the size 3 mobile bearing tibial tray, size 3 posterior stabilized femoral  component and 38 patella are cemented into place and the patella is  held  with a clamp. A trial 10 mm posterior stabilized rotating platform insert is  placed, knee held in full extension and all extruded cement removed.  Synovectomy is also performed. Once the cement is fully hardened then the  permanent 10 mm posterior stabilized rotating platform insert is placed in  the size 3 tibial tray. The wound is then copiously irrigated with  antibiotic solution and the extensor mechanism closed over a Hemovac drain  with interrupted #1 PDS. Flexion against gravity was at 135 degrees.  Tourniquet was released with a total time of 40 minutes. The subcu was  closed with interrupted 2-0 Vicryl and subcuticular running 4-0 Monocryl.  The catheter for the Marcaine pain pump is then placed and the pump  initiated. A bulky sterile dressing was applied. Drains hooked to suction,  she was awakened and transported to recovery in stable condition.                                               Ollen Gross, M.D.    FA/MEDQ  D:  08/06/2002  T:  08/07/2002  Job:  [id number]

## 2010-08-14 NOTE — H&P (Signed)
[name], [name]                         ACCOUNT NO.:  [id number]   MEDICAL RECORD NO.:  [id number]                   PATIENT TYPE:  INP   LOCATION:  0474                                 FACILITY:  [facility]   PHYSICIAN:  Ollen Gross, M.D.                 DATE OF BIRTH:  July 24, 1926   DATE OF ADMISSION:  08/06/2002  DATE OF DISCHARGE:  08/10/2002                                HISTORY & PHYSICAL   CHIEF COMPLAINT:  Bilateral knee pain right greater than left.   HISTORY OF PRESENT ILLNESS:  The patient is a 77 year old female well known  to Ollen Gross, M.D. having a known history of bilateral knee  osteoarthritis.  She has a known history.  She comes in, seen in the office  and states that her knee pain has been getting progressively worse to the  point now where she is having difficulty getting around and also having pain  at night.  She has undergone previous injections which only provided her  temporary relief.  She is at the point now where she is having significant  edema and having problems with her mobility.  Risks and benefits of a total  knee procedure have been discussed with the patient at length.  Due to her  pain and lack of mobility she would like to proceed with surgery.   ALLERGIES:  SHELLFISH.   CURRENT MEDICATIONS:  1. Lotrel 5/20 mg twice a day.  2. Metoprolol 50 mg daily.  3. Diovan 320 mg daily.  4. Hydrochlorothiazide 25 mg twice a day.  5. She also takes Metformin 1000 twice a day.  6. Actos 45 mg.  7. She also takes hydroxizine/hydrochlorothiazide 25 mg as needed.   PAST MEDICAL HISTORY:  1. Hypertension.  2. Non-insulin-dependent diabetes mellitus.  3. Elevated cholesterol.  4. Arthritis.  5. History of breast cancer.   PAST SURGICAL HISTORY:  Right mastectomy with lymph node resection secondary  to cancer 1991.   SOCIAL HISTORY:  Married.  Retired [employer] operational officer.  Nonsmoker.  No alcohol.  Has  four children.   FAMILY HISTORY:  Diabetes with  mother and also family history of lymphoma.   REVIEW OF SYSTEMS:  GENERAL:  No fevers, chills, night sweats.  NEUROLOGIC:  No seizures, syncope, paralysis.  RESPIRATORY:  No shortness of breath,  productive cough, or hemoptysis.  CARDIOVASCULAR:  No chest pain, angina,  orthopnea.  GASTROINTESTINAL:  The patient has had some diarrhea.  No  constipation.  No blood or mucus in the stool.  No nausea or vomiting.  GENITOURINARY:  No dysuria, hematuria, discharge.  MUSCULOSKELETAL:  Pertinent to that in the knee found in history of present illness.   PHYSICAL EXAMINATION:  VITAL SIGNS:  Pulse 60, respirations 16, blood  pressure 162/62.  GENERAL:  A 77 year old white female well-nourished, well-developed, no  acute distress, alert, oriented, cooperative, pleasant at time of  examination.  HEENT:  Normocephalic, atraumatic.  Pupils round, reactive.  EOMs are  intact.  Noted to wear glasses.  NECK:  Supple.  No carotid bruits.  CHEST:  Clear to auscultation anterior/posterior chest walls.  HEART:  Regular rate and rhythm.  No murmur.  ABDOMEN:  Soft, nontender.  Bowel sounds present.  RECTAL:  Not done.  Not pertinent to present illness.  BREASTS:  Not done.  Not pertinent to present illness.  GENITALIA:  Not done.  Not pertinent to present illness.  EXTREMITIES:  Significant to the right knee.  Right knee shows 0-120 degrees  range of motion, moderate crepitus, no instability, diffuse tenderness.   IMPRESSION:  1. Osteoarthritis right knee.  2. Hypertension.  3. Non-insulin-dependent diabetes mellitus.  4. Arthritis.  5. History of breast cancer.  6. Hypercholesterolemia.   PLAN:  The patient will be admitted to [facility] to undergo a  right total knee replacement arthroplasty.  Surgery will be performed by  Ollen Gross, M.D.     Alexzandrew L. Julien Girt, P.A.              Ollen Gross, M.D.    ALP/MEDQ  D:  09/20/2002  T:  09/20/2002  Job:  [id number]

## 2010-10-13 ENCOUNTER — Encounter (INDEPENDENT_AMBULATORY_CARE_PROVIDER_SITE_OTHER): Payer: Medicare Other | Admitting: Vascular Surgery

## 2010-10-13 DIAGNOSIS — I6529 Occlusion and stenosis of unspecified carotid artery: Secondary | ICD-10-CM

## 2010-10-14 NOTE — Assessment & Plan Note (Signed)
OFFICE VISIT  NASTEHO, GLANTZ DOB:  [birth year]                                       10/13/2010 ZOXWR#:[id number]  Patient presents today for continued followup of her asymptomatic carotid stenosis.  This is my first visit with her.  She has been seen in the past, dating back to 2007, by Dr. Cari Caraway.  She had a recent carotid duplex in [facility] on 09/02/10, and I have this for review.  Patient reports no change from a neurologic standpoint, specifically denying amaurosis fugax, transient ischemic attack, or stroke.  She reports that she has been stable from a cardiac standpoint.  On physical exam today, a well-developed and well-nourished white female appearing her age in no acute distress.  Blood pressure on the right is 172/67 and on the left 124/73.  Heart rate is 54, respirations 20. HEENT is normal. Carotid arteries are without bruits bilaterally. Radial pulses are 2+ bilaterally.  Musculoskeletal shows no major deformity or cyanosis.  Neurologic:  No focal weakness or paresthesias. Skin without ulcers or rashes.  I reviewed her duplex with her.  This shows no change over time in the 50% to 69% stenosis prevention.  I discussed this at length with patient.  She does see Armanda Magic at 6- month interval from a cardiac standpoint and is having her 53-month interval duplex done at [facility].  I feel it is appropriate for the followup to continue there.  I explained to patient, and she will notify us should she develop any neurologic deficits.  Otherwise we will see her should she develop deficits or have progression of her asymptomatic disease.  Otherwise we will have continue her duplexes at [facility], as is currently being done and see [place] on an as-needed basis.    Larina Earthly, M.D. Electronically Signed  TFE/MEDQ  D:  10/13/2010  T:  10/14/2010  Job:  [id number]  cc:   Armanda Magic, M.D.

## 2010-11-20 ENCOUNTER — Other Ambulatory Visit: Payer: Self-pay | Admitting: Family Medicine

## 2010-11-20 DIAGNOSIS — Z1231 Encounter for screening mammogram for malignant neoplasm of breast: Secondary | ICD-10-CM

## 2010-11-20 DIAGNOSIS — Z9011 Acquired absence of right breast and nipple: Secondary | ICD-10-CM

## 2010-12-03 ENCOUNTER — Ambulatory Visit
Admission: RE | Admit: 2010-12-03 | Discharge: 2010-12-03 | Disposition: A | Discharge status: Home / Self Care | Payer: Medicare Other | Source: Ambulatory Visit | Attending: Family Medicine | Admitting: Family Medicine

## 2010-12-03 DIAGNOSIS — Z1231 Encounter for screening mammogram for malignant neoplasm of breast: Secondary | ICD-10-CM

## 2010-12-03 DIAGNOSIS — Z9011 Acquired absence of right breast and nipple: Secondary | ICD-10-CM

## 2011-04-07 DIAGNOSIS — I6529 Occlusion and stenosis of unspecified carotid artery: Secondary | ICD-10-CM | POA: Diagnosis not present

## 2011-04-07 DIAGNOSIS — E782 Mixed hyperlipidemia: Secondary | ICD-10-CM | POA: Diagnosis not present

## 2011-04-07 DIAGNOSIS — Z1331 Encounter for screening for depression: Secondary | ICD-10-CM | POA: Diagnosis not present

## 2011-04-07 DIAGNOSIS — E1139 Type 2 diabetes mellitus with other diabetic ophthalmic complication: Secondary | ICD-10-CM | POA: Diagnosis not present

## 2011-04-07 DIAGNOSIS — J449 Chronic obstructive pulmonary disease, unspecified: Secondary | ICD-10-CM | POA: Diagnosis not present

## 2011-04-07 DIAGNOSIS — E1129 Type 2 diabetes mellitus with other diabetic kidney complication: Secondary | ICD-10-CM | POA: Diagnosis not present

## 2011-04-07 DIAGNOSIS — D510 Vitamin B12 deficiency anemia due to intrinsic factor deficiency: Secondary | ICD-10-CM | POA: Diagnosis not present

## 2011-04-20 DIAGNOSIS — N189 Chronic kidney disease, unspecified: Secondary | ICD-10-CM | POA: Diagnosis not present

## 2011-05-07 DIAGNOSIS — D510 Vitamin B12 deficiency anemia due to intrinsic factor deficiency: Secondary | ICD-10-CM | POA: Diagnosis not present

## 2011-05-20 DIAGNOSIS — E1139 Type 2 diabetes mellitus with other diabetic ophthalmic complication: Secondary | ICD-10-CM | POA: Diagnosis not present

## 2011-05-20 DIAGNOSIS — H31009 Unspecified chorioretinal scars, unspecified eye: Secondary | ICD-10-CM | POA: Diagnosis not present

## 2011-05-20 DIAGNOSIS — H43399 Other vitreous opacities, unspecified eye: Secondary | ICD-10-CM | POA: Diagnosis not present

## 2011-05-20 DIAGNOSIS — H43819 Vitreous degeneration, unspecified eye: Secondary | ICD-10-CM | POA: Diagnosis not present

## 2011-05-20 DIAGNOSIS — E11359 Type 2 diabetes mellitus with proliferative diabetic retinopathy without macular edema: Secondary | ICD-10-CM | POA: Diagnosis not present

## 2011-06-04 DIAGNOSIS — N189 Chronic kidney disease, unspecified: Secondary | ICD-10-CM | POA: Diagnosis not present

## 2011-06-04 DIAGNOSIS — D510 Vitamin B12 deficiency anemia due to intrinsic factor deficiency: Secondary | ICD-10-CM | POA: Diagnosis not present

## 2011-06-23 DIAGNOSIS — H0230 Blepharochalasis unspecified eye, unspecified eyelid: Secondary | ICD-10-CM | POA: Diagnosis not present

## 2011-06-23 DIAGNOSIS — E11319 Type 2 diabetes mellitus with unspecified diabetic retinopathy without macular edema: Secondary | ICD-10-CM | POA: Diagnosis not present

## 2011-06-23 DIAGNOSIS — E119 Type 2 diabetes mellitus without complications: Secondary | ICD-10-CM | POA: Diagnosis not present

## 2011-06-23 DIAGNOSIS — Z961 Presence of intraocular lens: Secondary | ICD-10-CM | POA: Diagnosis not present

## 2011-07-08 DIAGNOSIS — E785 Hyperlipidemia, unspecified: Secondary | ICD-10-CM | POA: Diagnosis not present

## 2011-07-08 DIAGNOSIS — D510 Vitamin B12 deficiency anemia due to intrinsic factor deficiency: Secondary | ICD-10-CM | POA: Diagnosis not present

## 2011-07-08 DIAGNOSIS — D649 Anemia, unspecified: Secondary | ICD-10-CM | POA: Diagnosis not present

## 2011-07-08 DIAGNOSIS — E119 Type 2 diabetes mellitus without complications: Secondary | ICD-10-CM | POA: Diagnosis not present

## 2011-07-08 DIAGNOSIS — I10 Essential (primary) hypertension: Secondary | ICD-10-CM | POA: Diagnosis not present

## 2011-07-08 DIAGNOSIS — E213 Hyperparathyroidism, unspecified: Secondary | ICD-10-CM | POA: Diagnosis not present

## 2011-07-16 DIAGNOSIS — M1710 Unilateral primary osteoarthritis, unspecified knee: Secondary | ICD-10-CM | POA: Diagnosis not present

## 2011-07-22 DIAGNOSIS — I129 Hypertensive chronic kidney disease with stage 1 through stage 4 chronic kidney disease, or unspecified chronic kidney disease: Secondary | ICD-10-CM | POA: Diagnosis not present

## 2011-07-26 DIAGNOSIS — J441 Chronic obstructive pulmonary disease with (acute) exacerbation: Secondary | ICD-10-CM | POA: Diagnosis not present

## 2011-07-30 ENCOUNTER — Other Ambulatory Visit: Payer: Self-pay | Admitting: Family Medicine

## 2011-07-30 ENCOUNTER — Ambulatory Visit
Admission: RE | Admit: 2011-07-30 | Discharge: 2011-07-30 | Disposition: A | Discharge status: Home / Self Care | Payer: Medicare Other | Source: Ambulatory Visit | Attending: Family Medicine | Admitting: Family Medicine

## 2011-07-30 DIAGNOSIS — J449 Chronic obstructive pulmonary disease, unspecified: Secondary | ICD-10-CM

## 2011-07-30 DIAGNOSIS — R079 Chest pain, unspecified: Secondary | ICD-10-CM | POA: Diagnosis not present

## 2011-07-30 DIAGNOSIS — R05 Cough: Secondary | ICD-10-CM | POA: Diagnosis not present

## 2011-08-05 DIAGNOSIS — D510 Vitamin B12 deficiency anemia due to intrinsic factor deficiency: Secondary | ICD-10-CM | POA: Diagnosis not present

## 2011-08-28 DIAGNOSIS — I6529 Occlusion and stenosis of unspecified carotid artery: Secondary | ICD-10-CM

## 2011-08-28 HISTORY — DX: Occlusion and stenosis of unspecified carotid artery: I65.29

## 2011-08-31 DIAGNOSIS — I10 Essential (primary) hypertension: Secondary | ICD-10-CM | POA: Diagnosis not present

## 2011-09-06 DIAGNOSIS — I359 Nonrheumatic aortic valve disorder, unspecified: Secondary | ICD-10-CM | POA: Diagnosis not present

## 2011-09-06 DIAGNOSIS — R0602 Shortness of breath: Secondary | ICD-10-CM | POA: Diagnosis not present

## 2011-09-06 DIAGNOSIS — D509 Iron deficiency anemia, unspecified: Secondary | ICD-10-CM | POA: Diagnosis not present

## 2011-09-06 DIAGNOSIS — I119 Hypertensive heart disease without heart failure: Secondary | ICD-10-CM | POA: Diagnosis not present

## 2011-09-06 DIAGNOSIS — I059 Rheumatic mitral valve disease, unspecified: Secondary | ICD-10-CM | POA: Diagnosis not present

## 2011-09-10 ENCOUNTER — Other Ambulatory Visit: Payer: Self-pay | Admitting: Family Medicine

## 2011-09-10 ENCOUNTER — Ambulatory Visit
Admission: RE | Admit: 2011-09-10 | Discharge: 2011-09-10 | Disposition: A | Discharge status: Home / Self Care | Payer: Medicare Other | Source: Ambulatory Visit | Attending: Family Medicine | Admitting: Family Medicine

## 2011-09-10 DIAGNOSIS — R05 Cough: Secondary | ICD-10-CM

## 2011-09-10 DIAGNOSIS — R0600 Dyspnea, unspecified: Secondary | ICD-10-CM

## 2011-09-10 DIAGNOSIS — J90 Pleural effusion, not elsewhere classified: Secondary | ICD-10-CM | POA: Diagnosis not present

## 2011-09-10 DIAGNOSIS — R0602 Shortness of breath: Secondary | ICD-10-CM | POA: Diagnosis not present

## 2011-09-10 DIAGNOSIS — R0609 Other forms of dyspnea: Secondary | ICD-10-CM | POA: Diagnosis not present

## 2011-09-10 DIAGNOSIS — R0989 Other specified symptoms and signs involving the circulatory and respiratory systems: Secondary | ICD-10-CM | POA: Diagnosis not present

## 2011-09-10 DIAGNOSIS — R5381 Other malaise: Secondary | ICD-10-CM | POA: Diagnosis not present

## 2011-09-14 ENCOUNTER — Other Ambulatory Visit: Payer: Self-pay | Admitting: Family Medicine

## 2011-09-14 DIAGNOSIS — J90 Pleural effusion, not elsewhere classified: Secondary | ICD-10-CM

## 2011-09-15 ENCOUNTER — Ambulatory Visit
Admission: RE | Admit: 2011-09-15 | Discharge: 2011-09-15 | Disposition: A | Discharge status: Home / Self Care | Payer: Medicare Other | Source: Ambulatory Visit | Attending: Family Medicine | Admitting: Family Medicine

## 2011-09-15 ENCOUNTER — Other Ambulatory Visit (HOSPITAL_COMMUNITY): Payer: Self-pay | Admitting: Family Medicine

## 2011-09-15 DIAGNOSIS — J90 Pleural effusion, not elsewhere classified: Secondary | ICD-10-CM

## 2011-09-15 DIAGNOSIS — R0989 Other specified symptoms and signs involving the circulatory and respiratory systems: Secondary | ICD-10-CM | POA: Diagnosis not present

## 2011-09-15 DIAGNOSIS — R0609 Other forms of dyspnea: Secondary | ICD-10-CM | POA: Diagnosis not present

## 2011-09-15 DIAGNOSIS — R911 Solitary pulmonary nodule: Secondary | ICD-10-CM | POA: Diagnosis not present

## 2011-09-15 DIAGNOSIS — I6529 Occlusion and stenosis of unspecified carotid artery: Secondary | ICD-10-CM | POA: Diagnosis not present

## 2011-09-15 DIAGNOSIS — Z853 Personal history of malignant neoplasm of breast: Secondary | ICD-10-CM | POA: Diagnosis not present

## 2011-09-15 DIAGNOSIS — R05 Cough: Secondary | ICD-10-CM | POA: Diagnosis not present

## 2011-09-15 DIAGNOSIS — R5381 Other malaise: Secondary | ICD-10-CM | POA: Diagnosis not present

## 2011-09-17 ENCOUNTER — Ambulatory Visit (HOSPITAL_COMMUNITY)
Admission: RE | Admit: 2011-09-17 | Discharge: 2011-09-17 | Disposition: A | Discharge status: Home / Self Care | Payer: Medicare Other | Source: Ambulatory Visit | Attending: Family Medicine | Admitting: Family Medicine

## 2011-09-17 ENCOUNTER — Other Ambulatory Visit (HOSPITAL_COMMUNITY): Payer: Self-pay | Admitting: Family Medicine

## 2011-09-17 DIAGNOSIS — Z853 Personal history of malignant neoplasm of breast: Secondary | ICD-10-CM | POA: Diagnosis not present

## 2011-09-17 DIAGNOSIS — J90 Pleural effusion, not elsewhere classified: Secondary | ICD-10-CM | POA: Diagnosis not present

## 2011-09-17 DIAGNOSIS — J069 Acute upper respiratory infection, unspecified: Secondary | ICD-10-CM | POA: Diagnosis not present

## 2011-10-05 DIAGNOSIS — E1129 Type 2 diabetes mellitus with other diabetic kidney complication: Secondary | ICD-10-CM | POA: Diagnosis not present

## 2011-10-05 DIAGNOSIS — D510 Vitamin B12 deficiency anemia due to intrinsic factor deficiency: Secondary | ICD-10-CM | POA: Diagnosis not present

## 2011-10-05 DIAGNOSIS — E782 Mixed hyperlipidemia: Secondary | ICD-10-CM | POA: Diagnosis not present

## 2011-10-05 DIAGNOSIS — J90 Pleural effusion, not elsewhere classified: Secondary | ICD-10-CM | POA: Diagnosis not present

## 2011-10-05 DIAGNOSIS — J449 Chronic obstructive pulmonary disease, unspecified: Secondary | ICD-10-CM | POA: Diagnosis not present

## 2011-10-05 DIAGNOSIS — E11319 Type 2 diabetes mellitus with unspecified diabetic retinopathy without macular edema: Secondary | ICD-10-CM | POA: Diagnosis not present

## 2011-10-05 DIAGNOSIS — I10 Essential (primary) hypertension: Secondary | ICD-10-CM | POA: Diagnosis not present

## 2011-10-05 DIAGNOSIS — E1165 Type 2 diabetes mellitus with hyperglycemia: Secondary | ICD-10-CM | POA: Diagnosis not present

## 2011-10-18 ENCOUNTER — Ambulatory Visit (INDEPENDENT_AMBULATORY_CARE_PROVIDER_SITE_OTHER): Payer: Medicare Other | Admitting: Pulmonary Disease

## 2011-10-18 ENCOUNTER — Ambulatory Visit (INDEPENDENT_AMBULATORY_CARE_PROVIDER_SITE_OTHER)
Admission: RE | Admit: 2011-10-18 | Discharge: 2011-10-18 | Disposition: A | Discharge status: Home / Self Care | Payer: Medicare Other | Source: Ambulatory Visit | Attending: Pulmonary Disease | Admitting: Pulmonary Disease

## 2011-10-18 ENCOUNTER — Encounter: Payer: Self-pay | Admitting: Pulmonary Disease

## 2011-10-18 ENCOUNTER — Other Ambulatory Visit (INDEPENDENT_AMBULATORY_CARE_PROVIDER_SITE_OTHER): Payer: Medicare Other

## 2011-10-18 VITALS — BP 128/78 | HR 58 | Temp 98.0°F | Ht 65.0 in | Wt 151.0 lb

## 2011-10-18 DIAGNOSIS — R0781 Pleurodynia: Secondary | ICD-10-CM

## 2011-10-18 DIAGNOSIS — J90 Pleural effusion, not elsewhere classified: Secondary | ICD-10-CM | POA: Diagnosis not present

## 2011-10-18 DIAGNOSIS — R071 Chest pain on breathing: Secondary | ICD-10-CM

## 2011-10-18 DIAGNOSIS — R079 Chest pain, unspecified: Secondary | ICD-10-CM | POA: Diagnosis not present

## 2011-10-18 LAB — CBC WITH DIFFERENTIAL/PLATELET
Basophils Absolute: 0 10*3/uL (ref 0.0–0.1)
Eosinophils Relative: 5 % (ref 0.0–5.0)
Monocytes Relative: 8.3 % (ref 3.0–12.0)
Neutrophils Relative %: 56.8 % (ref 43.0–77.0)
Platelets: 306 10*3/uL (ref 150.0–400.0)
RDW: 15.3 % — ABNORMAL HIGH (ref 11.5–14.6)
WBC: 10.6 10*3/uL — ABNORMAL HIGH (ref 4.5–10.5)

## 2011-10-18 LAB — COMPREHENSIVE METABOLIC PANEL
Albumin: 4.2 g/dL (ref 3.5–5.2)
BUN: 26 mg/dL — ABNORMAL HIGH (ref 6–23)
CO2: 27 mEq/L (ref 19–32)
Calcium: 10 mg/dL (ref 8.4–10.5)
Chloride: 103 mEq/L (ref 96–112)
GFR: 42.75 mL/min — ABNORMAL LOW (ref >60.00)
Glucose, Bld: 74 mg/dL (ref 70–99)
Potassium: 4.1 mEq/L (ref 3.5–5.1)

## 2011-10-18 LAB — SEDIMENTATION RATE: Sed Rate: 80 mm/hr — ABNORMAL HIGH (ref 0–22)

## 2011-10-18 MED ORDER — PREDNISONE 5 MG PO TABS: ORAL_TABLET | ORAL | Status: DC

## 2011-10-18 NOTE — Progress Notes (Deleted)
  Subjective:    Patient ID: [name], female    DOB: 06/21/1926, 78 y.o.   MRN: [id number]  HPI    Review of Systems  Constitutional: Negative for fever and appetite change.  HENT: Negative for ear pain, congestion, sore throat, trouble swallowing and dental problem.   Respiratory: Positive for cough and shortness of breath.   Cardiovascular: Negative for chest pain, palpitations and leg swelling.  Gastrointestinal: Negative for abdominal pain.  Musculoskeletal: Negative for joint swelling.  Skin: Negative for rash.  Neurological: Negative for headaches.  Psychiatric/Behavioral: Negative for dysphoric mood. The patient is not nervous/anxious.        Objective:   Physical Exam        Assessment & Plan:

## 2011-10-18 NOTE — Patient Instructions (Addendum)
Prednisone 5 mg pill>>4 pills for 2 days, 3 pills for 2 days, 2 pills for 2 days, then 1 pill daily Lab tests today Follow up in one week with chest xray

## 2011-10-18 NOTE — Progress Notes (Signed)
Chief Complaint  Patient presents with  . pulmonary consult    refer Dr. Clelia Croft for pleural effusion dx in april. Pt c/o pain on left side and it hurts when she coughs. her breathing is better    History of Present Illness: [name] is a 78 y.o. female for evaluation of chest pain and pleural effusion.  She was doing well with her breathing until March 2013.  She then developed shortness of breath.  This would occur at rest and with exertion.  She has also noticed frequent cough and wheeze with cough, but these are improved compared to March.  She has occasional clear sputum, and denies hemoptysis.  She had chest xray which showed small, left pleural effusion.  This was confirmed on CT chest.  She had evaluation by IR for thoracentesis, but there was not enough fluid to warrant procedure.  She has noticed chest discomfort on her left side when she takes a breath or moves.  She denies any trauma to her side, or bruising.  Coughing also makes the pain worse on her left side, and this can radiate around to her back.  She was treated for pneumonia in January 2012.  There is no history of exposure to tuberculosis.  She has not noticed fever during her current illness.  She denies skin rash, or joint swelling.  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  She never smoked, but has second hand exposure from her Father when she was growing up.  She has a Development worker, international aid.  She is from [place], and denies any recent travel.  She has not had any recent sick exposures.  She did have breast cancer, but this was in the early 1990's.  She was given a steroid shot by her PCP.  This helped her breathing and chest discomfort for some time after she received the injection.  She has a history of chronic kidney disease, and was advised by her kidney doctor that she should not take NSAID medications.   Past Medical History  Diagnosis Date  . Diabetes mellitus     type2  . Hypertension   .  Hypercholesterolemia   . Osteoarthritis   . Breast cancer   . CKD (chronic kidney disease), stage III   . Carotid artery stenosis   . Mild mitral regurgitation   . Pernicious anemia   . Pleural effusion     Past Surgical History  Procedure Date  . Mastectomy     right  . Right knee replacement   . Right shoulder surgery   . Tonsillectomy     Current Outpatient Prescriptions on File Prior to Visit  Medication Sig Dispense Refill  . furosemide (LASIX) 40 MG tablet Take 80 mg by mouth daily.      Marland Kitchen gabapentin (NEURONTIN) 100 MG capsule Take 100 mg by mouth 3 (three) times daily.      . insulin NPH (NOVOLIN N) 100 UNIT/ML injection Inject 30 Units into the skin daily.      . metoprolol (LOPRESSOR) 50 MG tablet Take 25 mg by mouth 2 (two) times daily.      . simvastatin (ZOCOR) 20 MG tablet Take 20 mg by mouth every evening.      . valsartan (DIOVAN) 160 MG tablet Take 160 mg by mouth 2 (two) times daily.        Allergies  Allergen Reactions  . Bisphosphonates     Contraindicated due to renal disease  . Ceftin (Cefuroxime)  RASH  . Penicillins     rash  . Shellfish Allergy     hives  . Zetia (Ezetimibe)     hives    Family History  Problem Relation Age of Onset  . Emphysema Father   . Lymphoma Father     History  Substance Use Topics  . Smoking status: Never Smoker   . Smokeless tobacco: Not on file  . Alcohol Use: No    Review of Systems  Constitutional: Negative for fever and appetite change.  HENT: Negative for ear pain, congestion, sore throat, trouble swallowing and dental problem.   Respiratory: Positive for cough and shortness of breath.   Cardiovascular: Negative for chest pain, palpitations and leg swelling.  Gastrointestinal: Negative for abdominal pain.  Musculoskeletal: Negative for joint swelling.  Skin: Negative for rash.  Neurological: Negative for headaches.  Psychiatric/Behavioral: Negative for dysphoric mood. The patient is not  nervous/anxious.     Physical Exam: Filed Vitals:   10/18/11 1534  BP: 128/78  Pulse: 58  Temp: 98 F (36.7 C)  TempSrc: Oral  Height: 5\' 5"  (1.651 m)  Weight: 151 lb (68.493 kg)  SpO2: 96%  ,  Current Encounter SPO2  10/18/11 1534 96%    Wt Readings from Last 3 Encounters:  10/18/11 151 lb (68.493 kg)    Body mass index is 25.13 kg/(m^2).   General - Splinting left side with deep inspiration ENT - TM clear, no sinus tenderness, no oral exudate, no LAN, no thyromegaly Cardiac - s1s2 regular, no murmur, pulses symmetric, no edema Chest - good air entry, decreased breath sounds left base, no wheeze/rales/rub Back - tender to palpation over left side Abd - soft, non-tender, no organomegaly, + bowel sounds Ext - normal motor strength Neuro - Cranial nerves are normal. PERLA. EOM's intact. Skin - no discernible active dermatitis, erythema, urticaria or inflammatory process. Psych - normal mood, and behavior.   CHEST - 2 VIEW 07/30/11 Comparison: Chest x-ray 03/01/2011.  Findings: Postoperative changes of right-sided mastectomy and right axillary nodal dissection are again noted. There is minimal blunting of the left costophrenic sulcus, which could suggest chronic pleural scarring or the presence of trace left-sided pleural effusion. No definite consolidative airspace disease. No right-sided pleural effusion. No definite suspicious appearing pulmonary nodule or mass. Pulmonary vasculature and the cardiomediastinal silhouette is within normal limits. Multiple prominent osteophytes are noted in the lower thoracic spine, which appears somewhat nodular on the lateral projection.  Atherosclerotic calcifications are noted within the arch of the aorta.  IMPRESSION:  1. Minimal blunting of the left costophrenic sulcus which could suggest chronic pleural scarring or the presence of trace left- sided pleural effusion. There is otherwise no radiographic evidence of acute cardiopulmonary  disease.  2. Atherosclerosis.  3. Status post right mastectomy and axillary nodal dissection.  Original Report Authenticated By: Florencia Reasons, M.D.  CHEST - 2 VIEW 09/10/11 Comparison: 07/30/2011  Findings: Evidence of right mastectomy and right axillary clips noted. Heart size at upper limits of normal. Increased now small left pleural effusion noted, obscuring the left lung base. A vessel seen on end projects over the left mid hemithorax. No new pulmonary opacity. No acute osseous finding.  IMPRESSION:  Increase in now small left pleural effusion, previously trace fluid.  Original Report Authenticated By: Harrel Lemon, M.D.  CT CHEST WITHOUT CONTRAST 09/14/11 Technique: Multidetector CT imaging of the chest was performed following the standard protocol without IV contrast.  Comparison: Chest x-ray of 09/10/2011 and  CT chest of 05/10/2007  Findings: There is a small left pleural effusion present which appears to be free flowing. Mild volume loss at the left lung base is noted as a result. No lung nodules are seen. Mild peribronchovascular nodularity is present within the right upper lobe laterally which appears stable and may be related to prior pneumonia, possibly atypical. The central airway is patent.  On soft tissue window images, the thyroid gland is unremarkable.  Surgical clips overlie the right axilla and changes of right mastectomy are noted. No mediastinal or hilar adenopathy is seen on this unenhanced study. Considerable coronary artery calcifications are present. No abnormality is noted within the upper abdomen.  IMPRESSION:  1. Small left pleural effusion. Mild volume loss at the left lung base.  2. No adenopathy or lung nodules are seen.  3. Probable chronic change in the right upper lobe may indicate prior infection, possibly atypical pneumonia.  4. Changes of right mastectomy.  5. Coronary artery calcifications.  Original Report Authenticated By: Juline Patch,  M.D.  [place] Chest 09/17/2011  *RADIOLOGY REPORT*  Clinical Data:  The patient has a prior history of breast cancer, recent upper respiratory infection, and small left pleural effusion.  LIMITED US OF (L)PLEURAL CAVITY  Comparison:  Chest CT  An ultrasound guided thoracentesis was thoroughly discussed with the patient and questions answered.  The benefits, risks, alternatives and complications were also discussed.  The patient understands and wishes to proceed with the procedure only if enough fluid was present to do safely. She reports she has been feeling better each day and did not want to proceed if unsafe.  Ultrasound was performed and only a small amount of fluid was found in the left inferior region, deemed unsafe for thoracentesis.  This was discussed with the patient who agreed.  IMPRESSION: Small amount of left pleural fluid which is not amendable for thoracentesis.  Thoracentesis not performed.   Read by Brayton El PA-C  Original Report Authenticated By: Richarda Overlie, M.D.   CHEST - 2 VIEW 10/18/11 Comparison: 09/10/2011  Findings: Cardiomediastinal silhouette is stable. Surgical clips in right axilla again noted. Status post right mastectomy. There is blunting of the left costophrenic angle probable due to trace left pleural effusion and chronic scarring. No focal infiltrate or pulmonary edema. Stable mild degenerative changes thoracic spine.  IMPRESSION:  Status post right mastectomy and right axillary lymph node dissection. Blunting of the left costophrenic angle probable due to trace left pleural effusion and chronic scarring. No pulmonary edema.  Original Report Authenticated By: Natasha Mead, M.D.  Lab Results  Component Value Date   WBC 12.2* 04/25/2010   HGB 9.2* 04/25/2010   HCT 28.2* 04/25/2010   MCV 80.6 04/25/2010   PLT 419* 04/25/2010    Lab Results  Component Value Date   CREATININE 1.27* 04/25/2010   BUN 17 04/25/2010   NA 139 04/25/2010   K 5.2* 04/25/2010   CL 107 04/25/2010    CO2 23 04/25/2010    Lab Results  Component Value Date   ALT 14 04/22/2010   AST 19 04/22/2010   ALKPHOS 99 04/22/2010   BILITOT 0.6 04/22/2010    Assessment/Plan:  Coralyn Helling, MD [facility]/Sleep Pager:  [phone] 10/18/2011, 3:44 PM

## 2011-10-19 LAB — ANA: Anti Nuclear Antibody(ANA): POSITIVE — AB

## 2011-10-19 LAB — ANTI-NUCLEAR AB-TITER (ANA TITER): ANA Titer 1: 1:640 {titer} — ABNORMAL HIGH

## 2011-10-19 LAB — RHEUMATOID FACTOR: Rhuematoid fact SerPl-aCnc: <10 IU/mL (ref <=14)

## 2011-10-20 DIAGNOSIS — R0781 Pleurodynia: Secondary | ICD-10-CM | POA: Insufficient documentation

## 2011-10-20 DIAGNOSIS — J90 Pleural effusion, not elsewhere classified: Secondary | ICD-10-CM | POA: Insufficient documentation

## 2011-10-20 NOTE — Assessment & Plan Note (Signed)
Likely related to pleural irritation.  Will re-assess after trial of prednisone therapy.

## 2011-10-20 NOTE — Assessment & Plan Note (Signed)
She has persistent left pleural effusion since at least May 2013.  This is associated with pleuritic pain.  Fluid amount, however, is small based on serial CXR, CT chest, and ultrasound.  I don't think there is enough fluid at present for thoracentesis.  She does have history of breast cancer, but it is less likely that the effusion is related to malignancy (either primary or recurrence).  One would have expected progressive increase in effusion if related to malignancy.  Infectious cause could also cause her effusion.  Again, this is less likely given lack of progression of effusion and lack of symptoms to suggest an infectious process.  I am concerned she has an inflammatory process which has triggered development of pleural effusion.  To further assess with check CBC, CMET, ANA, RF, ESR, and quantiferon gold assay.  Will also give her empiric course of prednisone.  Advised that NSAID therapy could be an option, but she is reluctant to use NSAID with history of renal dysfunction.

## 2011-10-26 ENCOUNTER — Institutional Professional Consult (permissible substitution): Payer: Medicare Other | Admitting: Pulmonary Disease

## 2011-10-26 ENCOUNTER — Ambulatory Visit (INDEPENDENT_AMBULATORY_CARE_PROVIDER_SITE_OTHER): Payer: Medicare Other | Admitting: Pulmonary Disease

## 2011-10-26 ENCOUNTER — Encounter: Payer: Self-pay | Admitting: Pulmonary Disease

## 2011-10-26 ENCOUNTER — Ambulatory Visit (INDEPENDENT_AMBULATORY_CARE_PROVIDER_SITE_OTHER)
Admission: RE | Admit: 2011-10-26 | Discharge: 2011-10-26 | Disposition: A | Discharge status: Home / Self Care | Payer: Medicare Other | Source: Ambulatory Visit | Attending: Pulmonary Disease | Admitting: Pulmonary Disease

## 2011-10-26 VITALS — BP 146/68 | HR 57 | Temp 97.6°F | Ht 66.0 in | Wt 150.6 lb

## 2011-10-26 DIAGNOSIS — J90 Pleural effusion, not elsewhere classified: Secondary | ICD-10-CM | POA: Diagnosis not present

## 2011-10-26 DIAGNOSIS — R071 Chest pain on breathing: Secondary | ICD-10-CM

## 2011-10-26 DIAGNOSIS — Z79899 Other long term (current) drug therapy: Secondary | ICD-10-CM | POA: Diagnosis not present

## 2011-10-26 DIAGNOSIS — R0781 Pleurodynia: Secondary | ICD-10-CM

## 2011-10-26 MED ORDER — PREDNISONE 5 MG PO TABS: 5.0000 mg | ORAL_TABLET | Freq: Every day | ORAL | Status: AC

## 2011-10-26 NOTE — Assessment & Plan Note (Signed)
She has persistent symptoms of left sided pain.  However, on exam she is able to take deep inspirations more freely w/o splinting.  She also has radiographic improvement in her pleural effusion.  Explained that she will need to continue prednisone therapy longer to determine full benefit.  Will arrange for V/Q scan to exclude possibility of thrombo-embolic disease and further assess for obstructive lung disease as contributors to her symptoms.

## 2011-10-26 NOTE — Progress Notes (Signed)
Chief Complaint  Patient presents with  . Follow-up    w/ cxr. Pt c/o left side pain and is worse this week. Rates 7/10. breathing has unchanged. denies any wheezing, chest tx   CC: [name]  History of Present Illness: [name] is a 78 y.o. female with pleuritic left chest pain and effusion.  She continues to feel pain in her left side.  She denies fever, sputum, skin rash, or joint swelling.   Past Medical History  Diagnosis Date  . Diabetes mellitus     type2  . Hypertension   . Hypercholesterolemia   . Osteoarthritis   . Breast cancer   . CKD (chronic kidney disease), stage III   . Carotid artery stenosis   . Mild mitral regurgitation   . Pernicious anemia   . Pleural effusion     Past Surgical History  Procedure Date  . Mastectomy     right  . Right knee replacement   . Right shoulder surgery   . Tonsillectomy     Outpatient Encounter Prescriptions as of 10/26/2011  Medication Sig Dispense Refill  . aspirin 81 MG tablet Take 81 mg by mouth daily.      . cyanocobalamin (,VITAMIN B-12,) 1000 MCG/ML injection Inject 1,000 mcg into the muscle every 30 (thirty) days.      . furosemide (LASIX) 40 MG tablet Take 80 mg by mouth daily.      Marland Kitchen gabapentin (NEURONTIN) 100 MG capsule Take 100 mg by mouth 3 (three) times daily.      . hydrOXYzine (ATARAX/VISTARIL) 25 MG tablet Take 25 mg by mouth at bedtime as needed.      . insulin NPH (NOVOLIN N) 100 UNIT/ML injection Inject 30 Units into the skin daily.      . metoprolol (LOPRESSOR) 50 MG tablet Take 25 mg by mouth 2 (two) times daily.      . simvastatin (ZOCOR) 20 MG tablet Take 20 mg by mouth every evening.      . valsartan (DIOVAN) 160 MG tablet Take 160 mg by mouth 2 (two) times daily.      Marland Kitchen DISCONTD: predniSONE (DELTASONE) 5 MG tablet 4 pills for 2 days, 3 pills for 2 days, 2 pills for 2 days, then 1 pill daily  30 tablet  1    Allergies  Allergen Reactions  . Bisphosphonates     Contraindicated due  to renal disease  . Ceftin (Cefuroxime)     RASH  . Penicillins     rash  . Shellfish Allergy     hives  . Zetia (Ezetimibe)     hives    Physical Exam:  Blood pressure 146/68, pulse 57, temperature 97.6 F (36.4 C), temperature source Oral, height 5\' 6"  (1.676 m), weight 150 lb 9.6 oz (68.312 kg), SpO2 97.00%.  Body mass index is 24.31 kg/(m^2). Wt Readings from Last 2 Encounters:  10/26/11 150 lb 9.6 oz (68.312 kg)  10/18/11 151 lb (68.493 kg)    General - no distress ENT - TM clear, no sinus tenderness, no oral exudate, no LAN, no thyromegaly  Cardiac - s1s2 regular, no murmur, pulses symmetric, no edema  Chest - good air entry, no wheeze/rales/rub  Back - mild tenderness to palpation over left side  Abd - soft, non-tender, no organomegaly, + bowel sounds  Ext - normal motor strength  Neuro - Cranial nerves are normal. PERLA. EOM's intact.  Skin - no discernible active dermatitis, erythema, urticaria or inflammatory process.  Psych - normal mood, and behavior.  Dg Chest 2 View  10/26/2011  *RADIOLOGY REPORT*  Clinical Data: Follow-up pleural effusion.  Shortness of breath. Left-sided chest pain.  CHEST - 2 VIEW  Comparison: 10/18/2011 and CT chest 09/15/2011.  Findings: Trachea is midline.  Heart size normal.  Lungs are hyperinflated but clear.  No pleural fluid.  Prominent osteophytes are seen in the lower thoracic spine. Surgical clips in the right axilla.  Right mastectomy.  IMPRESSION: No acute findings.  Original Report Authenticated By: Reyes Ivan, M.D.   Lab Results  Component Value Date   CREATININE 1.3* 10/18/2011   BUN 26* 10/18/2011   NA 141 10/18/2011   K 4.1 10/18/2011   CL 103 10/18/2011   CO2 27 10/18/2011   Lab Results  Component Value Date   ALT 14 10/18/2011   AST 18 10/18/2011   ALKPHOS 80 10/18/2011   BILITOT 1.0 10/18/2011   CBC    Component Value Date/Time   WBC 10.6* 10/18/2011 1639   RBC 4.88 10/18/2011 1639   HGB 13.6 10/18/2011 1639    HCT 41.0 10/18/2011 1639   PLT 306.0 10/18/2011 1639   MCV 84.0 10/18/2011 1639   MCH 26.3 04/25/2010 0352   MCHC 33.1 10/18/2011 1639   RDW 15.3* 10/18/2011 1639   LYMPHSABS 3.1 10/18/2011 1639   MONOABS 0.9 10/18/2011 1639   EOSABS 0.5 10/18/2011 1639   BASOSABS 0.0 10/18/2011 1639   Lab Results  Component Value Date   ANA POS* 10/18/2011   RF <10 10/18/2011  1:640 titer with speckled pattern  Lab Results  Component Value Date   ESRSEDRATE 80* 10/18/2011   Quantiferon gold negative 10/18/11   Assessment/Plan:  Coralyn Helling, MD [facility]/Sleep Pager:  [phone] 10/26/2011, 2:29 PM

## 2011-10-26 NOTE — Assessment & Plan Note (Signed)
She has increased ESR and positive ANA with 1:640 titer.  She has radiographic improvement after prednisone therapy.  I would like to transition to NSAID's, but she is reluctant to do this given history of kidney problems.  Will discuss with Dr. Darrick Penna.  She may also need further evaluation by rheumatology, but will defer this decision to her primary care physician.

## 2011-10-26 NOTE — Addendum Note (Signed)
Addended by: Tommie Sams on: 10/26/2011 03:25 PM   Modules accepted: Orders

## 2011-10-26 NOTE — Patient Instructions (Addendum)
Continue prednisone 5 mg daily until next visit Follow up in 4 weeks

## 2011-10-28 ENCOUNTER — Ambulatory Visit (HOSPITAL_COMMUNITY)
Admission: RE | Admit: 2011-10-28 | Discharge: 2011-10-28 | Disposition: A | Discharge status: Home / Self Care | Payer: Medicare Other | Source: Ambulatory Visit | Attending: Pulmonary Disease | Admitting: Pulmonary Disease

## 2011-10-28 ENCOUNTER — Telehealth: Payer: Self-pay | Admitting: Pulmonary Disease

## 2011-10-28 DIAGNOSIS — R071 Chest pain on breathing: Secondary | ICD-10-CM | POA: Insufficient documentation

## 2011-10-28 DIAGNOSIS — R0602 Shortness of breath: Secondary | ICD-10-CM | POA: Diagnosis not present

## 2011-10-28 DIAGNOSIS — R0781 Pleurodynia: Secondary | ICD-10-CM

## 2011-10-28 MED ORDER — XENON XE 133 GAS
10.0000 | GAS_FOR_INHALATION | Freq: Once | RESPIRATORY_TRACT | Status: AC | PRN
  Administered 2011-10-28: 10 via RESPIRATORY_TRACT

## 2011-10-28 MED ORDER — TECHNETIUM TO 99M ALBUMIN AGGREGATED
3.0000 | Freq: Once | INTRAVENOUS | Status: AC | PRN
  Administered 2011-10-28: 3 via INTRAVENOUS

## 2011-10-28 NOTE — Telephone Encounter (Signed)
Spoke with pt. She is requesting the results of her lung scan done today. I advised will forward msg to VS and call her back with response once results are reviewed. She verbalized understanding and states nothing further needed. Please advise, thanks!

## 2011-10-29 NOTE — Telephone Encounter (Signed)
Pt aware VS is not in the office, would like 8.1.13 cxr and per&vent results as soon as possible.  Dr Craige Cotta please advise, thanks.

## 2011-10-30 NOTE — Telephone Encounter (Signed)
Dg Chest 2 View  10/28/2011  *RADIOLOGY REPORT*  Clinical Data: Pleuritic chest pain.  CHEST - 2 VIEW  Comparison: 10/26/2011  Findings: Prior right mastectomy.  Heart is normal size.  Blunting of the left costophrenic angle compatible with pleural thickening. No visible effusions.  No confluent airspace opacities.  No acute bony abnormality.  IMPRESSION: No acute findings or change.  Original Report Authenticated By: Cyndie Chime, M.D.   [facility] Pulmonary Per & Vent  10/28/2011  *RADIOLOGY REPORT*  Clinical Data: Pleuritic chest pain and shortness of breath  NM PULMONARY VENTILATION AND PERFUSION SCAN  Radiopharmaceutical: CURIE MAA TECHNETIUM TO 46M ALBUMIN AGGREGATED, CURIE xenon xe 133 gas 10 milli Curie XENON XE 133 GAS  Comparison: Correlation is made with chest x-ray performed earlier today.  Findings: There are small scattered sub segmental perfusion defects, particularly within the posterior segment of the right upper lobe; however, there are no segmental defects on the perfusion images.  Ventilation is slightly patchy along the lateral margins of the right lung.  IMPRESSION: Low probability for pulmonary embolus.  Original Report Authenticated By: Brandon Melnick, M.D.    Left message explaining to patient that V/Q scan was negative.  She needs to continue using prednisone as discussed at last ROV.

## 2011-11-01 NOTE — Telephone Encounter (Signed)
[id number]. Zayna Toste, CMA  

## 2011-11-02 NOTE — Telephone Encounter (Signed)
I spoke with patient about results and she verbalized understanding and had no questions 

## 2011-11-05 DIAGNOSIS — D510 Vitamin B12 deficiency anemia due to intrinsic factor deficiency: Secondary | ICD-10-CM | POA: Diagnosis not present

## 2011-11-15 DIAGNOSIS — Z79899 Other long term (current) drug therapy: Secondary | ICD-10-CM | POA: Diagnosis not present

## 2011-11-18 DIAGNOSIS — H43399 Other vitreous opacities, unspecified eye: Secondary | ICD-10-CM | POA: Diagnosis not present

## 2011-11-18 DIAGNOSIS — E1139 Type 2 diabetes mellitus with other diabetic ophthalmic complication: Secondary | ICD-10-CM | POA: Diagnosis not present

## 2011-11-18 DIAGNOSIS — E11359 Type 2 diabetes mellitus with proliferative diabetic retinopathy without macular edema: Secondary | ICD-10-CM | POA: Diagnosis not present

## 2011-11-18 DIAGNOSIS — H43819 Vitreous degeneration, unspecified eye: Secondary | ICD-10-CM | POA: Diagnosis not present

## 2011-11-23 ENCOUNTER — Ambulatory Visit (INDEPENDENT_AMBULATORY_CARE_PROVIDER_SITE_OTHER): Payer: Medicare Other | Admitting: Pulmonary Disease

## 2011-11-23 ENCOUNTER — Encounter: Payer: Self-pay | Admitting: Pulmonary Disease

## 2011-11-23 VITALS — BP 126/74 | HR 56 | Temp 98.0°F | Ht 66.0 in | Wt 152.2 lb

## 2011-11-23 DIAGNOSIS — R0781 Pleurodynia: Secondary | ICD-10-CM

## 2011-11-23 DIAGNOSIS — J90 Pleural effusion, not elsewhere classified: Secondary | ICD-10-CM

## 2011-11-23 DIAGNOSIS — R071 Chest pain on breathing: Secondary | ICD-10-CM

## 2011-11-23 MED ORDER — PREDNISONE 2.5 MG PO TABS: ORAL_TABLET | ORAL | Status: DC

## 2011-11-23 NOTE — Assessment & Plan Note (Signed)
She has clinical and radiographic improvement with prednisone therapy.  Will continue to taper her prednisone with plan to have her off prednisone in 4 weeks.  Will f/u in 6 weeks with chest xray to re-assess.

## 2011-11-23 NOTE — Assessment & Plan Note (Signed)
She had increased ESR and positive ANA with 1:640 titer.  She has clinical and radiographic improvement after prednisone therapy.  Will continue to taper off prednisone as tolerated.

## 2011-11-23 NOTE — Patient Instructions (Signed)
Prednisone 2.5 mg pills>>1 pill daily for 2 weeks, then 1 pill every other day for 2 weeks, then stop prednisone Follow up in 6 weeks with chest xray

## 2011-11-23 NOTE — Progress Notes (Signed)
Chief Complaint  Patient presents with  . Follow-up    breathing is much better, left side pain is better but does have some pain today. denies any wheezing, chest tx, cough   CC: [name]  History of Present Illness: [name] is a 77 y.o. female with pleuritic left chest pain and effusion.  Her breathing and chest pain are improved.  She denies fever, sputum, skin rash, or joint swelling.   Past Medical History  Diagnosis Date  . Diabetes mellitus     type2  . Hypertension   . Hypercholesterolemia   . Osteoarthritis   . Breast cancer   . CKD (chronic kidney disease), stage III   . Carotid artery stenosis   . Mild mitral regurgitation   . Pernicious anemia   . Pleural effusion     Past Surgical History  Procedure Date  . Mastectomy     right  . Right knee replacement   . Right shoulder surgery   . Tonsillectomy     Outpatient Encounter Prescriptions as of 11/23/2011  Medication Sig Dispense Refill  . aspirin 81 MG tablet Take 81 mg by mouth daily.      . cyanocobalamin (,VITAMIN B-12,) 1000 MCG/ML injection Inject 1,000 mcg into the muscle every 30 (thirty) days.      . furosemide (LASIX) 40 MG tablet Take 80 mg by mouth daily.      Marland Kitchen gabapentin (NEURONTIN) 100 MG capsule Take 100 mg by mouth 3 (three) times daily.      . hydrOXYzine (ATARAX/VISTARIL) 25 MG tablet Take 25 mg by mouth at bedtime as needed.      . insulin NPH (NOVOLIN N) 100 UNIT/ML injection Inject 30 Units into the skin daily.      . metoprolol (LOPRESSOR) 50 MG tablet Take 25 mg by mouth 2 (two) times daily.      . predniSONE (DELTASONE) 5 MG tablet Once a day      . valsartan (DIOVAN) 160 MG tablet Take 160 mg by mouth 2 (two) times daily.      Marland Kitchen DISCONTD: simvastatin (ZOCOR) 20 MG tablet Take 20 mg by mouth every evening.        Allergies  Allergen Reactions  . Bisphosphonates     Contraindicated due to renal disease  . Ceftin (Cefuroxime)     RASH  . Penicillins     rash  .  Shellfish Allergy     hives  . Zetia (Ezetimibe)     hives    Physical Exam:  Blood pressure 126/74, pulse 56, temperature 98 F (36.7 C), temperature source Oral, height 5\' 6"  (1.676 m), weight 152 lb 3.2 oz (69.037 kg), SpO2 94.00%.  Body mass index is 24.57 kg/(m^2). Wt Readings from Last 2 Encounters:  11/23/11 152 lb 3.2 oz (69.037 kg)  10/26/11 150 lb 9.6 oz (68.312 kg)    General - no distress ENT - TM clear, no sinus tenderness, no oral exudate, no LAN, no thyromegaly  Cardiac - s1s2 regular, no murmur, pulses symmetric, no edema  Chest - good air entry, no wheeze/rales/rub  Back - mild tenderness to palpation over left side  Abd - soft, non-tender, no organomegaly, + bowel sounds  Ext - normal motor strength  Neuro - Cranial nerves are normal. PERLA. EOM's intact.  Skin - no discernible active dermatitis, erythema, urticaria or inflammatory process.  Psych - normal mood, and behavior.  Dg Chest 2 View  10/28/2011  *RADIOLOGY REPORT*  Clinical  Data: Pleuritic chest pain.  CHEST - 2 VIEW  Comparison: 10/26/2011  Findings: Prior right mastectomy.  Heart is normal size.  Blunting of the left costophrenic angle compatible with pleural thickening. No visible effusions.  No confluent airspace opacities.  No acute bony abnormality.  IMPRESSION: No acute findings or change.  Original Report Authenticated By: Cyndie Chime, M.D.   [facility]monary Per & Vent  10/28/2011  *RADIOLOGY REPORT*  Clinical Data: Pleuritic chest pain and shortness of breath  NM PULMONARY VENTILATION AND PERFUSION SCAN  Radiopharmaceutical: CURIE MAA TECHNETIUM TO 39M ALBUMIN AGGREGATED, CURIE xenon xe 133 gas 10 milli Curie XENON XE 133 GAS  Comparison: Correlation is made with chest x-ray performed earlier today.  Findings: There are small scattered sub segmental perfusion defects, particularly within the posterior segment of the right upper lobe; however, there are no segmental defects on the  perfusion images.  Ventilation is slightly patchy along the lateral margins of the right lung.  IMPRESSION: Low probability for pulmonary embolus.  Original Report Authenticated By: Brandon Melnick, M.D.     Assessment/Plan:  Coralyn Helling, MD [facility]Critical Care/Sleep Pager:  [phone] 11/23/2011, 4:22 PM

## 2011-12-07 DIAGNOSIS — D510 Vitamin B12 deficiency anemia due to intrinsic factor deficiency: Secondary | ICD-10-CM | POA: Diagnosis not present

## 2011-12-16 DIAGNOSIS — Z23 Encounter for immunization: Secondary | ICD-10-CM | POA: Diagnosis not present

## 2012-01-04 DIAGNOSIS — D509 Iron deficiency anemia, unspecified: Secondary | ICD-10-CM | POA: Diagnosis not present

## 2012-01-14 ENCOUNTER — Other Ambulatory Visit (INDEPENDENT_AMBULATORY_CARE_PROVIDER_SITE_OTHER): Payer: Medicare Other

## 2012-01-14 ENCOUNTER — Ambulatory Visit (INDEPENDENT_AMBULATORY_CARE_PROVIDER_SITE_OTHER)
Admission: RE | Admit: 2012-01-14 | Discharge: 2012-01-14 | Disposition: A | Discharge status: Home / Self Care | Payer: Medicare Other | Source: Ambulatory Visit | Attending: Pulmonary Disease | Admitting: Pulmonary Disease

## 2012-01-14 ENCOUNTER — Ambulatory Visit (INDEPENDENT_AMBULATORY_CARE_PROVIDER_SITE_OTHER): Payer: Medicare Other | Admitting: Pulmonary Disease

## 2012-01-14 ENCOUNTER — Encounter: Payer: Self-pay | Admitting: Pulmonary Disease

## 2012-01-14 VITALS — BP 130/64 | HR 56 | Temp 96.6°F | Ht 65.0 in | Wt 154.4 lb

## 2012-01-14 DIAGNOSIS — R0781 Pleurodynia: Secondary | ICD-10-CM

## 2012-01-14 DIAGNOSIS — R071 Chest pain on breathing: Secondary | ICD-10-CM

## 2012-01-14 MED ORDER — PREDNISONE 5 MG PO TABS: ORAL_TABLET | ORAL | Status: DC

## 2012-01-14 NOTE — Progress Notes (Signed)
Chief Complaint  Patient presents with  . Follow-up    CXR done --Pt reports she is startin to have increased pain starting at rib into back x3wks--reports prednisone taper helped for a short time --SOB w exertion,wheezing,slight nonprod cough   CC: [name]  History of Present Illness: [name] is a 78 y.o. female with pleuritic left chest pain and effusion.  She was feeling better until she stopped prednisone.  After that she had recurrence of her left sided chest pain.  This has been associated with occasional cough.  She denies fever, sputum, hemoptysis, or skin rash.  Tests: CT chest 09/14/11>>small left pleural effusion ANA 10/18/11>>1:640 with speckled pattern V/Q scan 10/28/11>>low probability   Past Medical History  Diagnosis Date  . Diabetes mellitus     type2  . Hypertension   . Hypercholesterolemia   . Osteoarthritis   . Breast cancer   . CKD (chronic kidney disease), stage III   . Carotid artery stenosis   . Mild mitral regurgitation   . Pernicious anemia   . Pleural effusion     Past Surgical History  Procedure Date  . Mastectomy     right  . Right knee replacement   . Right shoulder surgery   . Tonsillectomy     Outpatient Encounter Prescriptions as of 01/14/2012  Medication Sig Dispense Refill  . aspirin 81 MG tablet Take 81 mg by mouth daily.      . cyanocobalamin (,VITAMIN B-12,) 1000 MCG/ML injection Inject 1,000 mcg into the muscle every 30 (thirty) days.      . furosemide (LASIX) 40 MG tablet Take 80 mg by mouth daily.      Marland Kitchen gabapentin (NEURONTIN) 100 MG capsule Take 100 mg by mouth 3 (three) times daily.      . hydrOXYzine (ATARAX/VISTARIL) 25 MG tablet Take 25 mg by mouth at bedtime as needed.      . insulin NPH (NOVOLIN N) 100 UNIT/ML injection Inject 30 Units into the skin daily.      . metoprolol (LOPRESSOR) 50 MG tablet Take 25 mg by mouth 2 (two) times daily.      . valsartan (DIOVAN) 160 MG tablet Take 160 mg by mouth 2  (two) times daily.      Marland Kitchen DISCONTD: predniSONE (DELTASONE) 2.5 MG tablet 1 pill daily for 2 weeks, then 1 pill every other day for 2 weeks, then stop prednisone  21 tablet  0    Allergies  Allergen Reactions  . Bisphosphonates     Contraindicated due to renal disease  . Ceftin (Cefuroxime)     RASH  . Penicillins     rash  . Shellfish Allergy     hives  . Zetia (Ezetimibe)     hives    Physical Exam:  Blood pressure 130/64, pulse 56, temperature 96.6 F (35.9 C), temperature source Oral, height 5\' 5"  (1.651 m), weight 154 lb 6.4 oz (70.035 kg), SpO2 94.00%.  Body mass index is 25.69 kg/(m^2).  Wt Readings from Last 2 Encounters:  01/14/12 154 lb 6.4 oz (70.035 kg)  11/23/11 152 lb 3.2 oz (69.037 kg)    General - no distress ENT - no sinus tenderness, no oral exudate, no LAN Cardiac - s1s2 regular, no murmur Chest - scattered rhonchi, no wheeze Back - mild tenderness to palpation over left side  Abd - soft, non-tender Ext - no edema Neuro - normal strength  Skin - no rashes Psych - normal mood, and behavior.  Dg Chest 2 View  01/14/2012  *RADIOLOGY REPORT*  Clinical Data: Pleuritic chest pain.  Shortness of breath.  CHEST - 2 VIEW  Comparison: 10/28/2011  Findings: There are no acute infiltrates or effusions.  Prior right mastectomy.  Heart size and vascularity are normal.  No acute osseous abnormality.  IMPRESSION: No acute abnormalities.  The lungs are hyperinflated.   Original Report Authenticated By: Gwynn Burly, M.D.      Assessment/Plan:  Coralyn Helling, MD [facility]Sleep Pager:  [phone] 01/14/2012, 1:58 PM

## 2012-01-14 NOTE — Patient Instructions (Signed)
Lab tests today Prednisone 5 mg pill>>1/2 pill daily for 1 week, then 1/2 pill every other day for 2 weeks.  If chest pain better, then stop prednisone Follow up in 4 weeks

## 2012-01-14 NOTE — Assessment & Plan Note (Signed)
She has recurrent of her symptoms after stopping prednisone.  Will restart low dose prednisone.  Will repeat ESR and ANA.  She may need evaluation from rheumatology.

## 2012-01-17 LAB — ANA: Anti Nuclear Antibody(ANA): POSITIVE — AB

## 2012-01-20 ENCOUNTER — Telehealth: Payer: Self-pay | Admitting: Pulmonary Disease

## 2012-01-20 NOTE — Telephone Encounter (Signed)
Lab Results  Component Value Date   ANA POS* 01/14/2012    1:160 titer with speckled pattern   Lab Results  Component Value Date   ESRSEDRATE 51* 01/14/2012    Discussed results with pt.  She reports feeling better since starting prednisone again.  Will continue current tx.

## 2012-02-04 DIAGNOSIS — D509 Iron deficiency anemia, unspecified: Secondary | ICD-10-CM | POA: Diagnosis not present

## 2012-02-16 ENCOUNTER — Encounter (HOSPITAL_COMMUNITY): Payer: Self-pay | Admitting: Emergency Medicine

## 2012-02-16 ENCOUNTER — Emergency Department (HOSPITAL_COMMUNITY)
Admission: EM | Admit: 2012-02-16 | Discharge: 2012-02-16 | Disposition: A | Discharge status: Home / Self Care | Payer: Medicare Other | Attending: Emergency Medicine | Admitting: Emergency Medicine

## 2012-02-16 ENCOUNTER — Ambulatory Visit (INDEPENDENT_AMBULATORY_CARE_PROVIDER_SITE_OTHER): Payer: Medicare Other | Admitting: Pulmonary Disease

## 2012-02-16 ENCOUNTER — Encounter: Payer: Self-pay | Admitting: Pulmonary Disease

## 2012-02-16 VITALS — HR 61 | Temp 97.0°F | Ht 65.0 in | Wt 154.8 lb

## 2012-02-16 DIAGNOSIS — Z79899 Other long term (current) drug therapy: Secondary | ICD-10-CM | POA: Diagnosis not present

## 2012-02-16 DIAGNOSIS — Z7982 Long term (current) use of aspirin: Secondary | ICD-10-CM | POA: Insufficient documentation

## 2012-02-16 DIAGNOSIS — I6529 Occlusion and stenosis of unspecified carotid artery: Secondary | ICD-10-CM | POA: Insufficient documentation

## 2012-02-16 DIAGNOSIS — I160 Hypertensive urgency: Secondary | ICD-10-CM | POA: Insufficient documentation

## 2012-02-16 DIAGNOSIS — Z853 Personal history of malignant neoplasm of breast: Secondary | ICD-10-CM | POA: Insufficient documentation

## 2012-02-16 DIAGNOSIS — N183 Chronic kidney disease, stage 3 unspecified: Secondary | ICD-10-CM | POA: Insufficient documentation

## 2012-02-16 DIAGNOSIS — D510 Vitamin B12 deficiency anemia due to intrinsic factor deficiency: Secondary | ICD-10-CM | POA: Diagnosis not present

## 2012-02-16 DIAGNOSIS — J90 Pleural effusion, not elsewhere classified: Secondary | ICD-10-CM | POA: Diagnosis not present

## 2012-02-16 DIAGNOSIS — I10 Essential (primary) hypertension: Secondary | ICD-10-CM

## 2012-02-16 DIAGNOSIS — E119 Type 2 diabetes mellitus without complications: Secondary | ICD-10-CM | POA: Diagnosis not present

## 2012-02-16 DIAGNOSIS — I129 Hypertensive chronic kidney disease with stage 1 through stage 4 chronic kidney disease, or unspecified chronic kidney disease: Secondary | ICD-10-CM | POA: Insufficient documentation

## 2012-02-16 DIAGNOSIS — R071 Chest pain on breathing: Secondary | ICD-10-CM

## 2012-02-16 DIAGNOSIS — M1990 Unspecified osteoarthritis, unspecified site: Secondary | ICD-10-CM | POA: Insufficient documentation

## 2012-02-16 DIAGNOSIS — Z794 Long term (current) use of insulin: Secondary | ICD-10-CM | POA: Insufficient documentation

## 2012-02-16 DIAGNOSIS — I87309 Chronic venous hypertension (idiopathic) without complications of unspecified lower extremity: Secondary | ICD-10-CM

## 2012-02-16 DIAGNOSIS — R0781 Pleurodynia: Secondary | ICD-10-CM

## 2012-02-16 DIAGNOSIS — E7800 Pure hypercholesterolemia, unspecified: Secondary | ICD-10-CM | POA: Diagnosis not present

## 2012-02-16 LAB — POCT I-STAT, CHEM 8
BUN: 34 mg/dL — ABNORMAL HIGH (ref 6–23)
HCT: 43 % (ref 36.0–46.0)
Hemoglobin: 14.6 g/dL (ref 12.0–15.0)
Sodium: 142 mEq/L (ref 135–145)
TCO2: 27 mmol/L (ref 0–100)

## 2012-02-16 LAB — URINALYSIS, ROUTINE W REFLEX MICROSCOPIC
Glucose, UA: NEGATIVE mg/dL
Leukocytes, UA: NEGATIVE
Nitrite: NEGATIVE
Protein, ur: 30 mg/dL — AB
Urobilinogen, UA: 0.2 mg/dL (ref 0.0–1.0)

## 2012-02-16 LAB — URINE MICROSCOPIC-ADD ON

## 2012-02-16 MED ORDER — METOPROLOL SUCCINATE ER 50 MG PO TB24
50.0000 mg | ORAL_TABLET | Freq: Once | ORAL | Status: AC
  Administered 2012-02-16: 50 mg via ORAL
  Filled 2012-02-16: qty 1

## 2012-02-16 NOTE — ED Provider Notes (Signed)
History     CSN: [id number]  Arrival date & time 02/16/12  1536   First MD Initiated Contact with Patient 02/16/12 1605      Chief Complaint  Patient presents with  . Hypertension     HPI Pt sent here from PCP with htn; pt sts hx of same and takes meds but was SBP <200 in office; pt denies complaint at present.  Patient denies any change in medications recently.  Patient did eat [place] and dressing yesterday at [employer].  Past Medical History  Diagnosis Date  . Diabetes mellitus     type2  . Hypertension   . Hypercholesterolemia   . Osteoarthritis   . Breast cancer   . CKD (chronic kidney disease), stage III   . Carotid artery stenosis   . Mild mitral regurgitation   . Pernicious anemia   . Pleural effusion     Past Surgical History  Procedure Date  . Mastectomy     right  . Right knee replacement   . Right shoulder surgery   . Tonsillectomy     Family History  Problem Relation Age of Onset  . Emphysema Father   . Lymphoma Father     History  Substance Use Topics  . Smoking status: Never Smoker   . Smokeless tobacco: Never Used  . Alcohol Use: No    OB History    Grav Para Term Preterm Abortions TAB SAB Ect Mult Living                  Review of Systems All other systems reviewed and are negative Allergies  Bisphosphonates; Ceftin; Penicillins; Shellfish allergy; and Zetia  Home Medications   Current Outpatient Rx  Name  Route  Sig  Dispense  Refill  . ASPIRIN 81 MG PO TABS   Oral   Take 81 mg by mouth daily.         Marland Kitchen VITAMIN D3 1000 UNITS PO CAPS   Oral   Take 1 capsule by mouth daily.          . CYANOCOBALAMIN 1000 MCG/ML IJ SOLN   Intramuscular   Inject 1,000 mcg into the muscle every 30 (thirty) days.         . FUROSEMIDE 40 MG PO TABS   Oral   Take 80 mg by mouth daily.         Marland Kitchen GABAPENTIN 100 MG PO CAPS   Oral   Take 100 mg by mouth 3 (three) times daily.         Marland Kitchen HYDROXYZINE HCL 25 MG PO TABS   Oral   Take  25 mg by mouth at bedtime as needed. For itching         . INSULIN ISOPHANE HUMAN 100 UNIT/ML [place] SUSP   Subcutaneous   Inject 30 Units into the skin daily.         . IRON PO   Oral   Take 1 tablet by mouth at bedtime.         Marland Kitchen METOPROLOL TARTRATE 50 MG PO TABS   Oral   Take 25 mg by mouth 2 (two) times daily.         Marland Kitchen PREDNISONE 5 MG PO TABS   Oral   Take 2.5 mg by mouth daily. 1/2 pill daily         . VALSARTAN 160 MG PO TABS   Oral   Take 160 mg by mouth 2 (two) times  daily.           BP 180/54  Pulse 61  Temp 98 F (36.7 C) (Oral)  Resp 20  SpO2 97%  Physical Exam  Nursing note and vitals reviewed. Constitutional: She is oriented to person, place, and time. She appears well-developed and well-nourished. No distress.  HENT:  Head: Normocephalic and atraumatic.  Eyes: Pupils are equal, round, and reactive to light.  Neck: Normal range of motion.  Cardiovascular: Normal rate and intact distal pulses.  Exam reveals no friction rub.   Pulmonary/Chest: No respiratory distress. She has no wheezes. She has no rales.  Abdominal: Normal appearance. She exhibits no distension.  Musculoskeletal: Normal range of motion.  Neurological: She is alert and oriented to person, place, and time. No cranial nerve deficit.  Skin: Skin is warm and dry. No rash noted.  Psychiatric: She has a normal mood and affect. Her behavior is normal.    ED Course  Procedures (including critical care time)  Medications  IRON PO (not administered)  metoprolol succinate (TOPROL-XL) 24 hr tablet 50 mg (50 mg Oral Given 02/16/12 1659)    Labs Reviewed  POCT I-STAT, CHEM 8 - Abnormal; Notable for the following:    BUN 34 (*)     Creatinine, Ser 1.40 (*)     Glucose, Bld 165 (*)     All other components within normal limits  URINALYSIS, ROUTINE W REFLEX MICROSCOPIC   No results found.   1. Asymptomatic chronic venous hypertension       MDM   Systolic blood pressures down  to  180 and mean arterial pressure is down to 82.  We'll discharge patient home.  This most likely secondary to increased sodium intake yesterday with the [place] and dressing gravy.  Cautioned her about salt and sodium intake.       Nelia Shi, MD 02/16/12 1758

## 2012-02-16 NOTE — ED Notes (Signed)
Pt sent here from PCP with htn; pt sts hx of same and takes meds but was SBP <200 in office; pt denies complaint at present

## 2012-02-16 NOTE — Assessment & Plan Note (Signed)
She has persistent elevation in her blood pressure.  Have d/w Dr. Clelia Croft and with PA at Dr.Turner's office.  They are in agreement that best option is to have her go to ER for further assessment/management of her blood pressure.  Pt is agreeable to this.  Advised that I will follow up on 11/21 if she remains in hospital, or will contact her to further discuss pulmonary plan if she is sent home from ER.

## 2012-02-16 NOTE — Progress Notes (Signed)
Chief Complaint  Patient presents with  . Follow-up    c/o left side rib pain and goes around to back side. DOE, occasional cough. no wheezing, chest tx    CC: [name], Armanda Magic  History of Present Illness: [name] is a 78 y.o. female with pleuritic left chest pain and effusion.  She is taking 2.5 mg prednisone daily.  She had more chest discomfort last week, but not as much over the past few days.  She has noticed some discomfort on the right side also.  She denies fever, sputum, joint pain, or skin rash.  She was noted to have elevated blood pressure of 210/105 on multiple readings in both arms.  She denies headache, dizziness, blurred vision, or chest pain.  Tests: CT chest 09/14/11>>small left pleural effusion ANA 10/18/11>>1:640 with speckled pattern V/Q scan 10/28/11>>low probability ANA 01/14/12>>1:160 with speckled pattern, ESR 51   Past Medical History  Diagnosis Date  . Diabetes mellitus     type2  . Hypertension   . Hypercholesterolemia   . Osteoarthritis   . Breast cancer   . CKD (chronic kidney disease), stage III   . Carotid artery stenosis   . Mild mitral regurgitation   . Pernicious anemia   . Pleural effusion     Past Surgical History  Procedure Date  . Mastectomy     right  . Right knee replacement   . Right shoulder surgery   . Tonsillectomy     Outpatient Encounter Prescriptions as of 02/16/2012  Medication Sig Dispense Refill  . aspirin 81 MG tablet Take 81 mg by mouth daily.      . Cholecalciferol (VITAMIN D3) 1000 UNITS CAPS Take by mouth daily.      . cyanocobalamin (,VITAMIN B-12,) 1000 MCG/ML injection Inject 1,000 mcg into the muscle every 30 (thirty) days.      . furosemide (LASIX) 40 MG tablet Take 80 mg by mouth daily.      Marland Kitchen gabapentin (NEURONTIN) 100 MG capsule Take 100 mg by mouth 3 (three) times daily.      . hydrOXYzine (ATARAX/VISTARIL) 25 MG tablet Take 25 mg by mouth at bedtime as needed.      . insulin NPH  (NOVOLIN N) 100 UNIT/ML injection Inject 30 Units into the skin daily.      . metoprolol (LOPRESSOR) 50 MG tablet Take 25 mg by mouth 2 (two) times daily.      . predniSONE (DELTASONE) 5 MG tablet 1/2 pill daily      . valsartan (DIOVAN) 160 MG tablet Take 160 mg by mouth 2 (two) times daily.      . [DISCONTINUED] predniSONE (DELTASONE) 5 MG tablet 1/2 pill daily for 1 week, then 1/2 pill every other day for 2 weeks  30 tablet  1    Allergies  Allergen Reactions  . Bisphosphonates     Contraindicated due to renal disease  . Ceftin (Cefuroxime)     RASH  . Penicillins     rash  . Shellfish Allergy     hives  . Zetia (Ezetimibe)     hives    Physical Exam:  Pulse 61, temperature 97 F (36.1 C), temperature source Oral, height 5\' 5"  (1.651 m), weight 154 lb 12.8 oz (70.217 kg), SpO2 97.00%.  Body mass index is 25.76 kg/(m^2).  Wt Readings from Last 2 Encounters:  02/16/12 154 lb 12.8 oz (70.217 kg)  01/14/12 154 lb 6.4 oz (70.035 kg)    General -  no distress ENT - no sinus tenderness, no oral exudate, no LAN Cardiac - s1s2 regular, no murmur Chest - no wheeze/rales Back - no tenderness Abd - soft, non-tender Ext - no edema Neuro - normal strength  Skin - no rashes Psych - normal mood, and behavior.  Assessment/Plan:  Coralyn Helling, MD [place] [facility]/Sleep Pager:  [phone] 02/16/2012, 2:39 PM

## 2012-02-16 NOTE — Assessment & Plan Note (Signed)
Related to pleuritis in setting of positive ANA with elevated titer.  Will continue prednisone at 2.5 mg daily for now.

## 2012-02-17 ENCOUNTER — Telehealth: Payer: Self-pay | Admitting: Pulmonary Disease

## 2012-02-17 NOTE — Telephone Encounter (Signed)
She report BP improved enough after intervention in ER so she did not need to stay in hospital.  She has been checking her BP at home and SBP is 160's to 170's.  I reviewed her BMET and U/A results from the ER.  Advised her to continue with current dose of prednisone.  Will have my nurse schedule ROV in 4 weeks with CXR.  Advised her to contact either Dr. Clelia Croft or Dr. Mayford Knife to discuss whether adjustments are needed for her anti-HTN medicines.

## 2012-03-01 DIAGNOSIS — T1510XA Foreign body in conjunctival sac, unspecified eye, initial encounter: Secondary | ICD-10-CM | POA: Diagnosis not present

## 2012-03-01 DIAGNOSIS — E119 Type 2 diabetes mellitus without complications: Secondary | ICD-10-CM | POA: Diagnosis not present

## 2012-03-01 DIAGNOSIS — I129 Hypertensive chronic kidney disease with stage 1 through stage 4 chronic kidney disease, or unspecified chronic kidney disease: Secondary | ICD-10-CM | POA: Diagnosis not present

## 2012-03-01 DIAGNOSIS — E213 Hyperparathyroidism, unspecified: Secondary | ICD-10-CM | POA: Diagnosis not present

## 2012-03-01 DIAGNOSIS — I10 Essential (primary) hypertension: Secondary | ICD-10-CM | POA: Diagnosis not present

## 2012-03-01 DIAGNOSIS — N2581 Secondary hyperparathyroidism of renal origin: Secondary | ICD-10-CM | POA: Diagnosis not present

## 2012-03-01 DIAGNOSIS — D649 Anemia, unspecified: Secondary | ICD-10-CM | POA: Diagnosis not present

## 2012-03-06 DIAGNOSIS — T1510XA Foreign body in conjunctival sac, unspecified eye, initial encounter: Secondary | ICD-10-CM | POA: Diagnosis not present

## 2012-03-06 DIAGNOSIS — I359 Nonrheumatic aortic valve disorder, unspecified: Secondary | ICD-10-CM | POA: Diagnosis not present

## 2012-03-06 DIAGNOSIS — D509 Iron deficiency anemia, unspecified: Secondary | ICD-10-CM | POA: Diagnosis not present

## 2012-03-13 DIAGNOSIS — I6529 Occlusion and stenosis of unspecified carotid artery: Secondary | ICD-10-CM | POA: Diagnosis not present

## 2012-03-13 DIAGNOSIS — I359 Nonrheumatic aortic valve disorder, unspecified: Secondary | ICD-10-CM | POA: Diagnosis not present

## 2012-03-13 DIAGNOSIS — I059 Rheumatic mitral valve disease, unspecified: Secondary | ICD-10-CM | POA: Diagnosis not present

## 2012-03-13 DIAGNOSIS — I119 Hypertensive heart disease without heart failure: Secondary | ICD-10-CM | POA: Diagnosis not present

## 2012-03-14 DIAGNOSIS — I120 Hypertensive chronic kidney disease with stage 5 chronic kidney disease or end stage renal disease: Secondary | ICD-10-CM | POA: Diagnosis not present

## 2012-03-23 ENCOUNTER — Telehealth: Payer: Self-pay | Admitting: Pulmonary Disease

## 2012-03-23 DIAGNOSIS — I119 Hypertensive heart disease without heart failure: Secondary | ICD-10-CM | POA: Diagnosis not present

## 2012-03-23 NOTE — Telephone Encounter (Signed)
Error.[name] ° °

## 2012-03-27 DIAGNOSIS — R05 Cough: Secondary | ICD-10-CM | POA: Diagnosis not present

## 2012-03-27 DIAGNOSIS — J069 Acute upper respiratory infection, unspecified: Secondary | ICD-10-CM | POA: Diagnosis not present

## 2012-04-05 DIAGNOSIS — N2581 Secondary hyperparathyroidism of renal origin: Secondary | ICD-10-CM | POA: Diagnosis not present

## 2012-04-05 DIAGNOSIS — I120 Hypertensive chronic kidney disease with stage 5 chronic kidney disease or end stage renal disease: Secondary | ICD-10-CM | POA: Diagnosis not present

## 2012-04-07 DIAGNOSIS — I10 Essential (primary) hypertension: Secondary | ICD-10-CM | POA: Diagnosis not present

## 2012-04-07 DIAGNOSIS — J90 Pleural effusion, not elsewhere classified: Secondary | ICD-10-CM | POA: Diagnosis not present

## 2012-04-07 DIAGNOSIS — E1139 Type 2 diabetes mellitus with other diabetic ophthalmic complication: Secondary | ICD-10-CM | POA: Diagnosis not present

## 2012-04-07 DIAGNOSIS — D510 Vitamin B12 deficiency anemia due to intrinsic factor deficiency: Secondary | ICD-10-CM | POA: Diagnosis not present

## 2012-04-07 DIAGNOSIS — E11319 Type 2 diabetes mellitus with unspecified diabetic retinopathy without macular edema: Secondary | ICD-10-CM | POA: Diagnosis not present

## 2012-04-07 DIAGNOSIS — E782 Mixed hyperlipidemia: Secondary | ICD-10-CM | POA: Diagnosis not present

## 2012-04-07 DIAGNOSIS — E1165 Type 2 diabetes mellitus with hyperglycemia: Secondary | ICD-10-CM | POA: Diagnosis not present

## 2012-04-20 ENCOUNTER — Ambulatory Visit (INDEPENDENT_AMBULATORY_CARE_PROVIDER_SITE_OTHER): Payer: Medicare Other | Admitting: Pulmonary Disease

## 2012-04-20 ENCOUNTER — Encounter: Payer: Self-pay | Admitting: Pulmonary Disease

## 2012-04-20 VITALS — BP 102/58 | HR 55 | Temp 97.5°F | Ht 63.0 in | Wt 150.0 lb

## 2012-04-20 DIAGNOSIS — R071 Chest pain on breathing: Secondary | ICD-10-CM | POA: Diagnosis not present

## 2012-04-20 DIAGNOSIS — N2581 Secondary hyperparathyroidism of renal origin: Secondary | ICD-10-CM | POA: Diagnosis not present

## 2012-04-20 DIAGNOSIS — I120 Hypertensive chronic kidney disease with stage 5 chronic kidney disease or end stage renal disease: Secondary | ICD-10-CM | POA: Diagnosis not present

## 2012-04-20 DIAGNOSIS — R0781 Pleurodynia: Secondary | ICD-10-CM

## 2012-04-20 NOTE — Assessment & Plan Note (Signed)
Related to pleuritis in setting of positive ANA with elevated titer.  Clinically resolve, and now off prednisone.  Will get copy of her CXR from December 2013.  Will follow up in 6 months.  Advised her to call if her symptoms recur before then.

## 2012-04-20 NOTE — Patient Instructions (Signed)
Will get copy of chest xray from December 2013 Follow up in 6 months

## 2012-04-20 NOTE — Progress Notes (Signed)
Chief Complaint  Patient presents with  . Follow-up    c/o slight left side rib pain, slight nasal congestion, PND occasionally. no cough, wheezing, chest tx, no SOB. pt is getting over cold    CC: [name], [name]  History of Present Illness: [name] is a 79 y.o. female with pleuritic left chest pain and effusion.  She has been off prednisone for about 1 month.  She has not noticed recurrence of left sided rib pain.  She denies fever, cough, sputum, joint swelling, or skin rash.  She had a cold in December, and had chest xray then.  She was told her chest xray was normal.  Tests: CT chest 09/14/11>>small left pleural effusion ANA 10/18/11>>1:640 with speckled pattern V/Q scan 10/28/11>>low probability ANA 01/14/12>>1:160 with speckled pattern, ESR 51   Past Medical History  Diagnosis Date  . Diabetes mellitus     type2  . Hypertension   . Hypercholesterolemia   . Osteoarthritis   . Breast cancer   . CKD (chronic kidney disease), stage III   . Carotid artery stenosis   . Mild mitral regurgitation   . Pernicious anemia   . Pleural effusion     Past Surgical History  Procedure Date  . Mastectomy     right  . Right knee replacement   . Right shoulder surgery   . Tonsillectomy     Outpatient Encounter Prescriptions as of 04/20/2012  Medication Sig Dispense Refill  . amLODipine-benazepril (LOTREL) 5-20 MG per capsule Take 1 capsule by mouth 2 (two) times daily.      Marland Kitchen aspirin 81 MG tablet Take 81 mg by mouth daily.      . calcitRIOL (ROCALTROL) 0.25 MCG capsule Take 0.25 mcg by mouth daily.      . colesevelam (WELCHOL) 625 MG tablet 3 tablets twice a day      . cyanocobalamin (,VITAMIN B-12,) 1000 MCG/ML injection Inject 1,000 mcg into the muscle every 30 (thirty) days.      . furosemide (LASIX) 40 MG tablet Take 80 mg by mouth daily.      Marland Kitchen gabapentin (NEURONTIN) 100 MG capsule Take 100 mg by mouth 3 (three) times daily.      . hydrOXYzine  (ATARAX/VISTARIL) 25 MG tablet Take 25 mg by mouth at bedtime as needed. For itching      . insulin NPH (NOVOLIN N) 100 UNIT/ML injection Inject 30 Units into the skin daily.      . IRON PO Take 1 tablet by mouth at bedtime.      . metoprolol (LOPRESSOR) 50 MG tablet Take 25 mg by mouth 2 (two) times daily.      . simvastatin (ZOCOR) 20 MG tablet Take 20 mg by mouth every evening.      . valsartan (DIOVAN) 160 MG tablet Take 160 mg by mouth 2 (two) times daily.      . [DISCONTINUED] Cholecalciferol (VITAMIN D3) 1000 UNITS CAPS Take 1 capsule by mouth daily.       . [DISCONTINUED] predniSONE (DELTASONE) 5 MG tablet Take 2.5 mg by mouth daily. 1/2 pill daily        Allergies  Allergen Reactions  . Bisphosphonates     Contraindicated due to renal disease  . Ceftin (Cefuroxime)     RASH  . Penicillins     rash  . Shellfish Allergy     hives  . Zetia (Ezetimibe)     hives    Physical Exam:  Filed Vitals:  04/20/12 1018  BP: 102/58  Pulse: 55  Temp: 97.5 F (36.4 C)  TempSrc: Oral  Height: 5\' 3"  (1.6 m)  Weight: 150 lb (68.04 kg)  SpO2: 96%    Body mass index is 26.57 kg/(m^2).   Wt Readings from Last 2 Encounters:  04/20/12 150 lb (68.04 kg)  02/16/12 154 lb 12.8 oz (70.217 kg)    General - no distress ENT - no sinus tenderness, no oral exudate, no LAN Cardiac - s1s2 regular, no murmur Chest - no wheeze/rales Back - no tenderness Abd - soft, non-tender Ext - no edema Neuro - normal strength  Skin - no rashes Psych - normal mood, and behavior.  Assessment/Plan:  Coralyn Helling, MD [place]Critical [facility]/Sleep Pager:  [phone] 04/20/2012, 11:02 AM

## 2012-05-09 DIAGNOSIS — D509 Iron deficiency anemia, unspecified: Secondary | ICD-10-CM | POA: Diagnosis not present

## 2012-06-06 DIAGNOSIS — D509 Iron deficiency anemia, unspecified: Secondary | ICD-10-CM | POA: Diagnosis not present

## 2012-06-22 ENCOUNTER — Ambulatory Visit (INDEPENDENT_AMBULATORY_CARE_PROVIDER_SITE_OTHER): Payer: Medicare Other | Admitting: Pulmonary Disease

## 2012-06-22 ENCOUNTER — Encounter: Payer: Self-pay | Admitting: Pulmonary Disease

## 2012-06-22 VITALS — BP 120/76 | HR 60 | Temp 97.3°F | Ht 64.0 in | Wt 151.6 lb

## 2012-06-22 DIAGNOSIS — R05 Cough: Secondary | ICD-10-CM

## 2012-06-22 MED ORDER — BUDESONIDE-FORMOTEROL FUMARATE 160-4.5 MCG/ACT IN AERO: 2.0000 | INHALATION_SPRAY | Freq: Two times a day (BID) | RESPIRATORY_TRACT | Status: DC

## 2012-06-22 NOTE — Assessment & Plan Note (Signed)
She developed cough after respiratory illness in December 2013 that has persisted.  Her recent chest xray was unremarkable, and I don't think her current symptoms are related to her previous respiratory problems.  I am concerned she could have developed reactive airway disease after respiratory infection.  Will give her sample of symbicort to use for the next two weeks.  She is to follow up for further assessment if she is not feeling better.

## 2012-06-22 NOTE — Progress Notes (Signed)
Chief Complaint  Patient presents with  . Follow-up    Pt reports she has been having prod coughing x6wks, having some wheezing cough worsens at night causing pain that extends from chest to back-- no other complaints    CC: [name], Armanda Magic  History of Present Illness: [name] is a 79 y.o. female with pleuritic left chest pain and effusion.  She continues to have a cough with clear sputum.  She has been getting wheezing in her chest.  She denies fever, sinus congestion, sore throat, or skin rash.  She gets sore in her chest when she coughs.  She has used an albuterol inhaler before, but not recently.  She had chest xray with Dr. Clelia Croft earlier this month, and this was unremarkable.  Tests: CT chest 09/14/11>>small left pleural effusion ANA 10/18/11>>1:640 with speckled pattern V/Q scan 10/28/11>>low probability ANA 01/14/12>>1:160 with speckled pattern, ESR 51 Spirometry 06/22/12 >> FEV1 1.37 (73%), FEV1% 73  Cheresa L Saulnier   has a past medical history of Diabetes mellitus; Hypertension; Hypercholesterolemia; Osteoarthritis; Breast cancer; CKD (chronic kidney disease), stage III; Carotid artery stenosis; Mild mitral regurgitation; Pernicious anemia; and Pleural effusion.  [name]  has past surgical history that includes Mastectomy; right knee replacement; right shoulder surgery; and Tonsillectomy.  Outpatient Encounter Prescriptions as of 06/22/2012  Medication Sig Dispense Refill  . amLODipine-benazepril (LOTREL) 5-20 MG per capsule Take 1 capsule by mouth 2 (two) times daily.      Marland Kitchen aspirin 81 MG tablet Take 81 mg by mouth daily.      . calcitRIOL (ROCALTROL) 0.25 MCG capsule Take 0.25 mcg by mouth daily.      . colesevelam (WELCHOL) 625 MG tablet 3 tablets twice a day      . cyanocobalamin (,VITAMIN B-12,) 1000 MCG/ML injection Inject 1,000 mcg into the muscle every 30 (thirty) days.      . furosemide (LASIX) 40 MG tablet Take 80 mg by mouth daily.      Marland Kitchen  gabapentin (NEURONTIN) 100 MG capsule Take 100 mg by mouth 3 (three) times daily.      . hydrOXYzine (ATARAX/VISTARIL) 25 MG tablet Take 25 mg by mouth at bedtime as needed. For itching      . insulin NPH (NOVOLIN N) 100 UNIT/ML injection Inject 30 Units into the skin daily.      . IRON PO Take 1 tablet by mouth at bedtime.      . metoprolol (LOPRESSOR) 50 MG tablet Take 25 mg by mouth 2 (two) times daily.      . [DISCONTINUED] simvastatin (ZOCOR) 20 MG tablet Take 20 mg by mouth every evening.      . [DISCONTINUED] valsartan (DIOVAN) 160 MG tablet Take 160 mg by mouth 2 (two) times daily.       No facility-administered encounter medications on file as of 06/22/2012.    Allergies  Allergen Reactions  . Bisphosphonates     Contraindicated due to renal disease  . Ceftin (Cefuroxime)     RASH  . Penicillins     rash  . Shellfish Allergy     hives  . Zetia (Ezetimibe)     hives    Physical Exam:  Filed Vitals:   06/22/12 1534  BP: 120/76  Pulse: 60  Temp: 97.3 F (36.3 C)  TempSrc: Oral  Height: 5\' 4"  (1.626 m)  Weight: 151 lb 9.6 oz (68.765 kg)  SpO2: 97%    Body mass index is 26.01 kg/(m^2).  Wt Readings from Last 2 Encounters:  06/22/12 151 lb 9.6 oz (68.765 kg)  04/20/12 150 lb (68.04 kg)    General - no distress ENT - no sinus tenderness, no oral exudate, no LAN Cardiac - s1s2 regular, no murmur Chest - no wheeze/rales Back - no tenderness Abd - soft, non-tender Ext - no edema Neuro - normal strength  Skin - no rashes Psych - normal mood, and behavior  Assessment/Plan:  Coralyn Helling, MD [place] [facility]Critical Care/Sleep Pager:  [phone] 06/22/2012, 3:49 PM

## 2012-06-22 NOTE — Patient Instructions (Signed)
Symbicort two puffs twice per day, and rinse mouth after each use Follow up in 3 weeks

## 2012-07-07 DIAGNOSIS — E1159 Type 2 diabetes mellitus with other circulatory complications: Secondary | ICD-10-CM | POA: Diagnosis not present

## 2012-07-07 DIAGNOSIS — E1129 Type 2 diabetes mellitus with other diabetic kidney complication: Secondary | ICD-10-CM | POA: Diagnosis not present

## 2012-07-07 DIAGNOSIS — E782 Mixed hyperlipidemia: Secondary | ICD-10-CM | POA: Diagnosis not present

## 2012-07-07 DIAGNOSIS — E1165 Type 2 diabetes mellitus with hyperglycemia: Secondary | ICD-10-CM | POA: Diagnosis not present

## 2012-07-07 DIAGNOSIS — E1139 Type 2 diabetes mellitus with other diabetic ophthalmic complication: Secondary | ICD-10-CM | POA: Diagnosis not present

## 2012-07-07 DIAGNOSIS — I739 Peripheral vascular disease, unspecified: Secondary | ICD-10-CM | POA: Diagnosis not present

## 2012-07-07 DIAGNOSIS — D510 Vitamin B12 deficiency anemia due to intrinsic factor deficiency: Secondary | ICD-10-CM | POA: Diagnosis not present

## 2012-07-07 DIAGNOSIS — E11319 Type 2 diabetes mellitus with unspecified diabetic retinopathy without macular edema: Secondary | ICD-10-CM | POA: Diagnosis not present

## 2012-07-07 DIAGNOSIS — I6529 Occlusion and stenosis of unspecified carotid artery: Secondary | ICD-10-CM | POA: Diagnosis not present

## 2012-07-07 DIAGNOSIS — I119 Hypertensive heart disease without heart failure: Secondary | ICD-10-CM | POA: Diagnosis not present

## 2012-07-11 ENCOUNTER — Telehealth: Payer: Self-pay | Admitting: Pulmonary Disease

## 2012-07-11 DIAGNOSIS — D638 Anemia in other chronic diseases classified elsewhere: Secondary | ICD-10-CM | POA: Diagnosis not present

## 2012-07-11 DIAGNOSIS — E119 Type 2 diabetes mellitus without complications: Secondary | ICD-10-CM | POA: Diagnosis not present

## 2012-07-11 DIAGNOSIS — I129 Hypertensive chronic kidney disease with stage 1 through stage 4 chronic kidney disease, or unspecified chronic kidney disease: Secondary | ICD-10-CM | POA: Diagnosis not present

## 2012-07-11 DIAGNOSIS — N184 Chronic kidney disease, stage 4 (severe): Secondary | ICD-10-CM | POA: Diagnosis not present

## 2012-07-11 DIAGNOSIS — N2581 Secondary hyperparathyroidism of renal origin: Secondary | ICD-10-CM | POA: Diagnosis not present

## 2012-07-11 MED ORDER — BUDESONIDE-FORMOTEROL FUMARATE 160-4.5 MCG/ACT IN AERO: 2.0000 | INHALATION_SPRAY | Freq: Two times a day (BID) | RESPIRATORY_TRACT | Status: DC

## 2012-07-11 NOTE — Telephone Encounter (Signed)
Pt aware rx sent. Nothing further was needed 

## 2012-07-21 ENCOUNTER — Ambulatory Visit: Payer: Medicare Other | Admitting: Pulmonary Disease

## 2012-07-27 DIAGNOSIS — N184 Chronic kidney disease, stage 4 (severe): Secondary | ICD-10-CM | POA: Diagnosis not present

## 2012-08-01 IMAGING — CR DG CHEST 2V
2 series · 2 of 2 positions shown · non-contrast
Comparison: 10/18/2011 and CT chest 09/15/2011.

CLINICAL DATA: Follow-up pleural effusion.  Shortness of breath.
Left-sided chest pain.

CHEST - 2 VIEW

[view not recorded (1 of 2)]
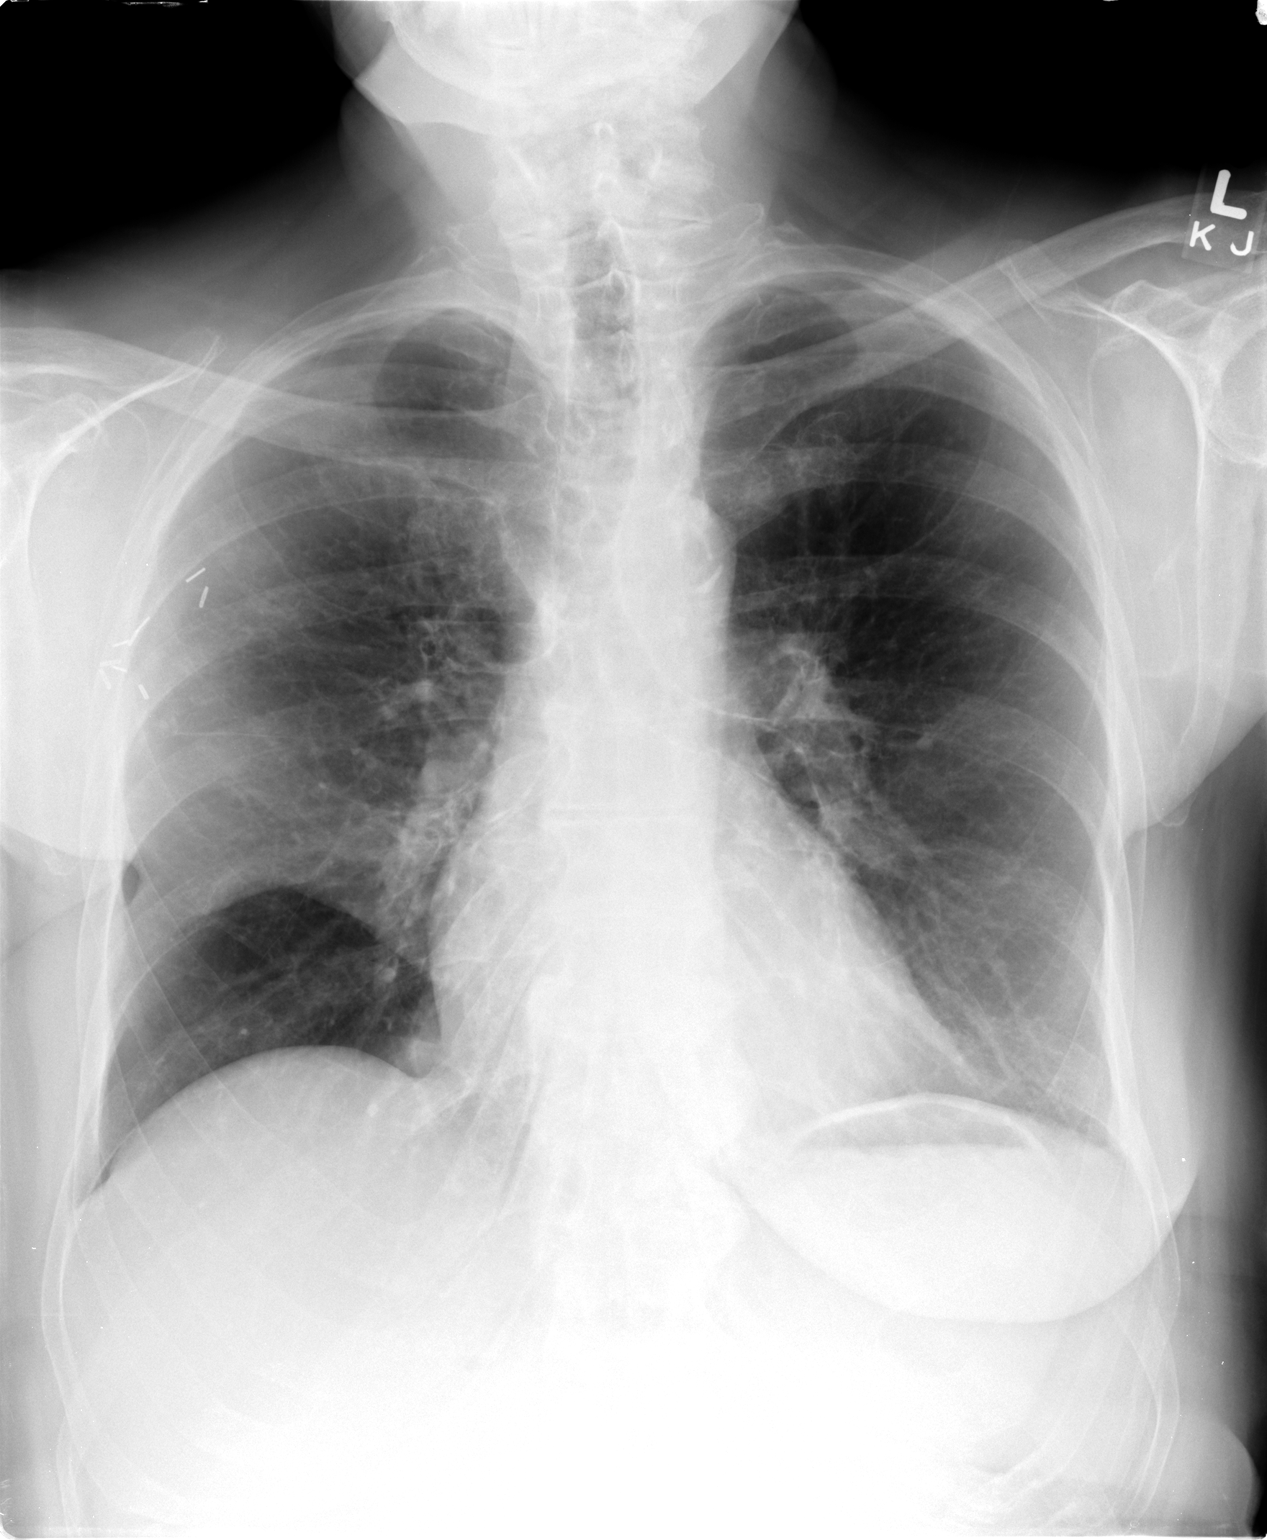

[view not recorded (2 of 2)]
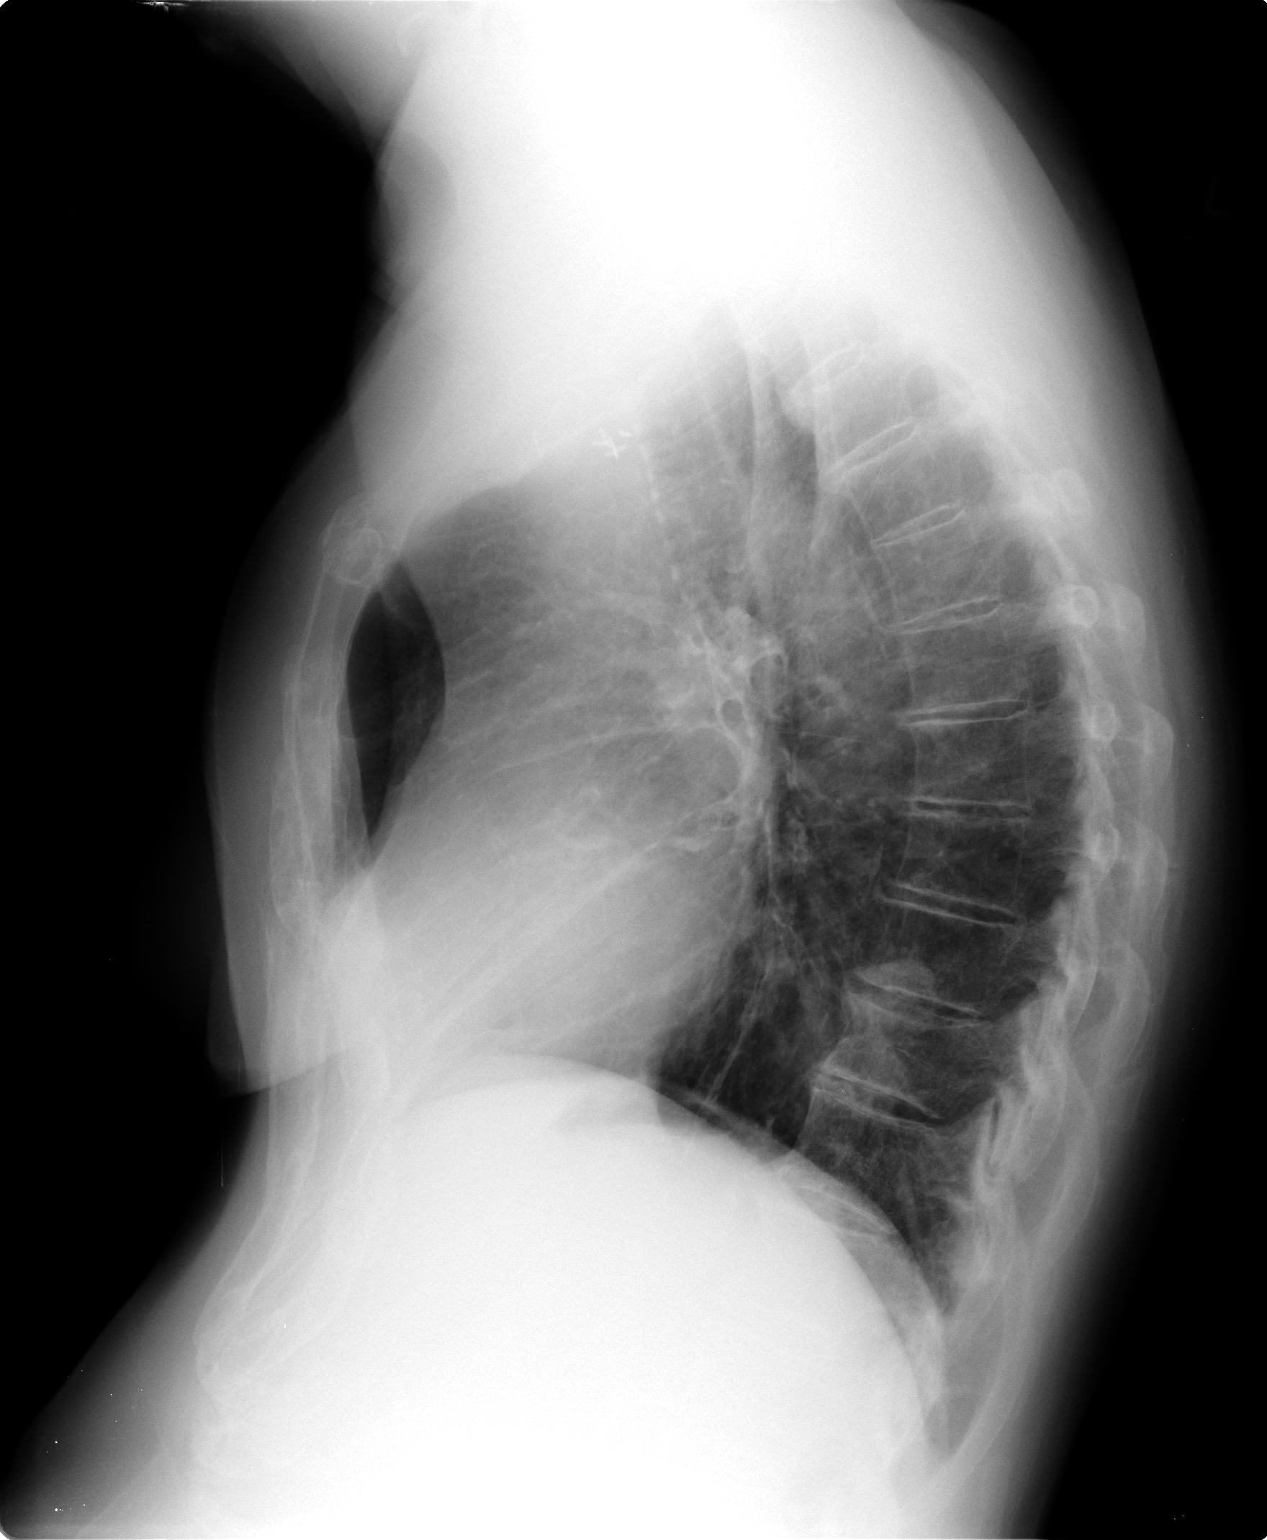

[2 of 2 positions shown; findings below may reference images not displayed]

FINDINGS: Trachea is midline.  Heart size normal.  Lungs are
hyperinflated but clear.  No pleural fluid.

Prominent osteophytes are seen in the lower thoracic spine.
Surgical clips in the right axilla.  Right mastectomy.
IMPRESSION: No acute findings.

## 2012-08-02 DIAGNOSIS — D510 Vitamin B12 deficiency anemia due to intrinsic factor deficiency: Secondary | ICD-10-CM | POA: Diagnosis not present

## 2012-09-01 DIAGNOSIS — D509 Iron deficiency anemia, unspecified: Secondary | ICD-10-CM | POA: Diagnosis not present

## 2012-09-15 DIAGNOSIS — I6529 Occlusion and stenosis of unspecified carotid artery: Secondary | ICD-10-CM | POA: Diagnosis not present

## 2012-10-05 DIAGNOSIS — I498 Other specified cardiac arrhythmias: Secondary | ICD-10-CM | POA: Diagnosis not present

## 2012-10-05 DIAGNOSIS — I119 Hypertensive heart disease without heart failure: Secondary | ICD-10-CM | POA: Diagnosis not present

## 2012-10-05 DIAGNOSIS — D510 Vitamin B12 deficiency anemia due to intrinsic factor deficiency: Secondary | ICD-10-CM | POA: Diagnosis not present

## 2012-10-18 ENCOUNTER — Ambulatory Visit: Payer: Medicare Other | Admitting: Pulmonary Disease

## 2012-10-25 DIAGNOSIS — N184 Chronic kidney disease, stage 4 (severe): Secondary | ICD-10-CM | POA: Diagnosis not present

## 2012-10-25 DIAGNOSIS — I129 Hypertensive chronic kidney disease with stage 1 through stage 4 chronic kidney disease, or unspecified chronic kidney disease: Secondary | ICD-10-CM | POA: Diagnosis not present

## 2012-10-25 DIAGNOSIS — E119 Type 2 diabetes mellitus without complications: Secondary | ICD-10-CM | POA: Diagnosis not present

## 2012-10-25 DIAGNOSIS — I10 Essential (primary) hypertension: Secondary | ICD-10-CM | POA: Diagnosis not present

## 2012-10-25 DIAGNOSIS — R809 Proteinuria, unspecified: Secondary | ICD-10-CM | POA: Diagnosis not present

## 2012-11-03 DIAGNOSIS — D510 Vitamin B12 deficiency anemia due to intrinsic factor deficiency: Secondary | ICD-10-CM | POA: Diagnosis not present

## 2012-11-22 DIAGNOSIS — E1139 Type 2 diabetes mellitus with other diabetic ophthalmic complication: Secondary | ICD-10-CM | POA: Diagnosis not present

## 2012-11-22 DIAGNOSIS — E11359 Type 2 diabetes mellitus with proliferative diabetic retinopathy without macular edema: Secondary | ICD-10-CM | POA: Diagnosis not present

## 2012-11-28 ENCOUNTER — Encounter: Payer: Self-pay | Admitting: Pulmonary Disease

## 2012-11-28 ENCOUNTER — Ambulatory Visit (INDEPENDENT_AMBULATORY_CARE_PROVIDER_SITE_OTHER): Payer: Medicare Other | Admitting: Pulmonary Disease

## 2012-11-28 VITALS — BP 120/66 | HR 68 | Temp 98.1°F | Ht 65.0 in | Wt 148.0 lb

## 2012-11-28 DIAGNOSIS — R05 Cough: Secondary | ICD-10-CM

## 2012-11-28 MED ORDER — ALBUTEROL SULFATE HFA 108 (90 BASE) MCG/ACT IN AERS: 2.0000 | INHALATION_SPRAY | Freq: Four times a day (QID) | RESPIRATORY_TRACT | Status: DC | PRN

## 2012-11-28 NOTE — Progress Notes (Signed)
Chief Complaint  Patient presents with  . Follow-up    Breathing is doing well. Reports hoarseness over the last 3 weeks.    CC: [name], Armanda Magic  History of Present Illness: SUMMERLYNN GLAUSER is a 79 y.o. female with cough.  She had prior history of Lt pleuritic chest pain with small pleural effusion which has resolved.  She has occasional cough with clear sputum.  She uses her symbicort 3 to 4 times per week, and this helps.  She denies sinus congestion, post nasal drip, reflux, wheezing, chest pain, or difficulty swallowing.  Tests: CT chest 09/14/11>>small left pleural effusion ANA 10/18/11>>1:640 with speckled pattern V/Q scan 10/28/11>>low probability ANA 01/14/12>>1:160 with speckled pattern, ESR 51 Spirometry 06/22/12 >> FEV1 1.37 (73%), FEV1% 73  Diora L Direnzo   has a past medical history of Diabetes mellitus; Hypertension; Hypercholesterolemia; Osteoarthritis; Breast cancer; CKD (chronic kidney disease), stage III; Carotid artery stenosis; Mild mitral regurgitation; Pernicious anemia; and Pleural effusion.  [name]  has past surgical history that includes Mastectomy; right knee replacement; right shoulder surgery; and Tonsillectomy.  Outpatient Encounter Prescriptions as of 11/28/2012  Medication Sig Dispense Refill  . amLODipine-benazepril (LOTREL) 5-20 MG per capsule Take 1 capsule by mouth 2 (two) times daily.      Marland Kitchen aspirin 81 MG tablet Take 81 mg by mouth daily.      . budesonide-formoterol (SYMBICORT) 160-4.5 MCG/ACT inhaler Inhale 2 puffs into the lungs 2 (two) times daily.  1 Inhaler  6  . calcitRIOL (ROCALTROL) 0.25 MCG capsule Take 0.25 mcg by mouth daily.      . cyanocobalamin (,VITAMIN B-12,) 1000 MCG/ML injection Inject 1,000 mcg into the muscle every 30 (thirty) days.      . furosemide (LASIX) 40 MG tablet Take 80 mg by mouth daily.      Marland Kitchen gabapentin (NEURONTIN) 100 MG capsule Take 100 mg by mouth 3 (three) times daily.      . hydrOXYzine  (ATARAX/VISTARIL) 25 MG tablet Take 25 mg by mouth at bedtime as needed. For itching      . insulin NPH (NOVOLIN N) 100 UNIT/ML injection Inject 30 Units into the skin daily.      . IRON PO Take 1 tablet by mouth at bedtime.      . [DISCONTINUED] colesevelam (WELCHOL) 625 MG tablet 3 tablets twice a day      . [DISCONTINUED] metoprolol (LOPRESSOR) 50 MG tablet Take 25 mg by mouth 2 (two) times daily.       No facility-administered encounter medications on file as of 11/28/2012.    Allergies  Allergen Reactions  . Bisphosphonates     Contraindicated due to renal disease  . Ceftin [Cefuroxime]     RASH  . Penicillins     rash  . Shellfish Allergy     hives  . Zetia [Ezetimibe]     hives    Physical Exam:  General - no distress ENT - no sinus tenderness, no oral exudate, no LAN Cardiac - s1s2 regular, no murmur Chest - right base inspiratory squeak resolved after coughing, no wheeze Back - no tenderness Abd - soft, non-tender Ext - no edema Neuro - normal strength  Skin - no rashes Psych - normal mood, and behavior  Assessment/Plan:  Coralyn Helling, MD [facility] Pager:  [phone] 11/28/2012, 4:18 PM

## 2012-11-28 NOTE — Assessment & Plan Note (Signed)
Improved with inhaler therapy.  She only uses her inhalers sporadically.  Will change from symbicort to prn proair.

## 2012-11-28 NOTE — Patient Instructions (Signed)
Proair two puffs up to four times per day as needed for cough, wheeze, or chest congestion Follow up in 1 year

## 2012-11-30 DIAGNOSIS — T7840XA Allergy, unspecified, initial encounter: Secondary | ICD-10-CM | POA: Diagnosis not present

## 2012-12-08 DIAGNOSIS — D510 Vitamin B12 deficiency anemia due to intrinsic factor deficiency: Secondary | ICD-10-CM | POA: Diagnosis not present

## 2013-01-01 DIAGNOSIS — D649 Anemia, unspecified: Secondary | ICD-10-CM | POA: Diagnosis not present

## 2013-01-01 DIAGNOSIS — Z23 Encounter for immunization: Secondary | ICD-10-CM | POA: Diagnosis not present

## 2013-01-01 DIAGNOSIS — I129 Hypertensive chronic kidney disease with stage 1 through stage 4 chronic kidney disease, or unspecified chronic kidney disease: Secondary | ICD-10-CM | POA: Diagnosis not present

## 2013-01-01 DIAGNOSIS — N2581 Secondary hyperparathyroidism of renal origin: Secondary | ICD-10-CM | POA: Diagnosis not present

## 2013-01-01 DIAGNOSIS — I10 Essential (primary) hypertension: Secondary | ICD-10-CM | POA: Diagnosis not present

## 2013-01-08 ENCOUNTER — Other Ambulatory Visit: Payer: Self-pay | Admitting: Family Medicine

## 2013-01-08 ENCOUNTER — Ambulatory Visit
Admission: RE | Admit: 2013-01-08 | Discharge: 2013-01-08 | Disposition: A | Discharge status: Home / Self Care | Payer: Medicare Other | Source: Ambulatory Visit | Attending: Family Medicine | Admitting: Family Medicine

## 2013-01-08 DIAGNOSIS — R05 Cough: Secondary | ICD-10-CM

## 2013-01-08 DIAGNOSIS — Z9011 Acquired absence of right breast and nipple: Secondary | ICD-10-CM

## 2013-01-08 DIAGNOSIS — E11319 Type 2 diabetes mellitus with unspecified diabetic retinopathy without macular edema: Secondary | ICD-10-CM | POA: Diagnosis not present

## 2013-01-08 DIAGNOSIS — Z853 Personal history of malignant neoplasm of breast: Secondary | ICD-10-CM

## 2013-01-08 DIAGNOSIS — R079 Chest pain, unspecified: Secondary | ICD-10-CM | POA: Diagnosis not present

## 2013-01-08 DIAGNOSIS — E1129 Type 2 diabetes mellitus with other diabetic kidney complication: Secondary | ICD-10-CM | POA: Diagnosis not present

## 2013-01-08 DIAGNOSIS — E1139 Type 2 diabetes mellitus with other diabetic ophthalmic complication: Secondary | ICD-10-CM | POA: Diagnosis not present

## 2013-01-08 DIAGNOSIS — E1159 Type 2 diabetes mellitus with other circulatory complications: Secondary | ICD-10-CM | POA: Diagnosis not present

## 2013-01-08 DIAGNOSIS — I739 Peripheral vascular disease, unspecified: Secondary | ICD-10-CM | POA: Diagnosis not present

## 2013-01-08 DIAGNOSIS — E782 Mixed hyperlipidemia: Secondary | ICD-10-CM | POA: Diagnosis not present

## 2013-01-08 DIAGNOSIS — I1310 Hypertensive heart and chronic kidney disease without heart failure, with stage 1 through stage 4 chronic kidney disease, or unspecified chronic kidney disease: Secondary | ICD-10-CM | POA: Diagnosis not present

## 2013-01-08 DIAGNOSIS — Z1231 Encounter for screening mammogram for malignant neoplasm of breast: Secondary | ICD-10-CM

## 2013-01-08 DIAGNOSIS — I6529 Occlusion and stenosis of unspecified carotid artery: Secondary | ICD-10-CM | POA: Diagnosis not present

## 2013-02-05 ENCOUNTER — Ambulatory Visit (INDEPENDENT_AMBULATORY_CARE_PROVIDER_SITE_OTHER): Payer: Medicare Other | Admitting: Internal Medicine

## 2013-02-05 ENCOUNTER — Encounter: Payer: Self-pay | Admitting: Internal Medicine

## 2013-02-05 VITALS — BP 134/62 | HR 74 | Temp 97.9°F | Ht 65.0 in | Wt 147.8 lb

## 2013-02-05 DIAGNOSIS — R05 Cough: Secondary | ICD-10-CM | POA: Diagnosis not present

## 2013-02-05 DIAGNOSIS — I10 Essential (primary) hypertension: Secondary | ICD-10-CM

## 2013-02-05 MED ORDER — AMLODIPINE BESYLATE-VALSARTAN 5-320 MG PO TABS: 1.0000 | ORAL_TABLET | Freq: Every day | ORAL | Status: DC

## 2013-02-05 MED ORDER — TRAMADOL HCL 50 MG PO TABS: ORAL_TABLET | ORAL | Status: DC

## 2013-02-05 NOTE — Patient Instructions (Addendum)
Stop lotrel and your cough and breathing should improve over the next several weeks  Start exforge 5-320 one daily - if lightheaded standing then this medication is too strong and I would then take it every other daily (or break in half and just take a half each day)  pepcid ac 20 mg after breakfast and before bed until the cough is gone for a week  For cough > use tussin and if needed take tramadol 50 mg one every 4 hours   For breathing > albuterol (proair) up to every 4 hours  GERD (REFLUX)  is an extremely common cause of respiratory symptoms, many times with no significant heartburn at all.    It can be treated with medication, but also with lifestyle changes including avoidance of late meals, excessive alcohol, smoking cessation, and avoid fatty foods, chocolate, peppermint, colas, red wine, and acidic juices such as orange juice.  NO MINT OR MENTHOL PRODUCTS SO NO COUGH DROPS  USE SUGARLESS CANDY INSTEAD (jolley ranchers or Stover's)  NO OIL BASED VITAMINS - use powdered substitutes.    Please schedule a follow up office visit in 2  weeks, sooner if needed to see Tammy NP to recheck your BP and progress off Lotrel

## 2013-02-05 NOTE — Progress Notes (Signed)
   CC: [name], Armanda Magic  History of Present Illness: [name] is a 79 y.o. Female never smoker with cough.  She had prior history of Lt pleuritic chest pain with small pleural effusion which resolved.  Dr Craige Cotta eval 11/28/12  She has occasional cough with clear sputum.  She uses her symbicort 3 to 4 times per week, and this helps.  rec Proair two puffs up to four times per day as needed for cough, wheeze, or chest congestion     02/05/2013 acute  ov/Hebe Merriwether re: chronic cough and wheeze on ACEi Chief Complaint  Patient presents with  . Acute Visit    Pt c/o cough x 4 wks- occ prod with minimal clear sputum.  She also c/o some wheezing. Using rescue inhaler twice per day.    Onset was insidious, symptoms progressive x months despite  rx  with abx, steroids x 3, symbicort, sob mostly with coughing.   No obvious day to day or daytime variabilty or assoc  cp or chest tightness,   overt sinus or hb symptoms. No unusual exp hx or h/o childhood pna/ asthma or knowledge of premature birth.  Sleeping ok without nocturnal  or early am exacerbation  of respiratory  c/o's or need for noct saba. Also denies any obvious fluctuation of symptoms with weather or environmental changes or other aggravating or alleviating factors except as outlined above   Current Medications, Allergies, Complete Past Medical History, Past Surgical History, Family History, and Social History were reviewed in [employer] record.  ROS  The following are not active complaints unless bolded sore throat, dysphagia, dental problems, itching, sneezing,  nasal congestion or excess/ purulent secretions, ear ache,   fever, chills, sweats, unintended wt loss, pleuritic or exertional cp, hemoptysis,  orthopnea pnd or leg swelling, presyncope, palpitations, heartburn, abdominal pain, anorexia, nausea, vomiting, diarrhea  or change in bowel or urinary habits, change in stools or urine,  dysuria,hematuria,  rash, arthralgias, visual complaints, headache, numbness weakness or ataxia or problems with walking or coordination,  change in mood/affect or memory.         Tests: CT chest 09/14/11>>small left pleural effusion ANA 10/18/11>>1:640 with speckled pattern V/Q scan 10/28/11>>low probability ANA 01/14/12>>1:160 with speckled pattern, ESR 51 Spirometry 06/22/12 >> FEV1 1.37 (73%), FEV1% 73  Quinlynn L Zuluaga   has a past medical history of Diabetes mellitus; Hypertension; Hypercholesterolemia; Osteoarthritis; Breast cancer; CKD (chronic kidney disease), stage III; Carotid artery stenosis; Mild mitral regurgitation; Pernicious anemia; and Pleural effusion.  [name]  has past surgical history that includes Mastectomy; right knee replacement; right shoulder surgery; and Tonsillectomy.    Physical Exam:  amb hoarse wf with prominent pseudowheeze General - no distress ENT - no sinus tenderness, no oral exudate, no LAN Cardiac - s1s2 regular, no murmur Chest - mostly transmitted upper airway "  Wheeze" Back - no tenderness Abd - soft, non-tender Ext - no edema Neuro - normal strength  Skin - no rashes Psych - normal mood, and behavior  Assessment/Plan:

## 2013-02-06 ENCOUNTER — Ambulatory Visit
Admission: RE | Admit: 2013-02-06 | Discharge: 2013-02-06 | Disposition: A | Discharge status: Home / Self Care | Payer: Medicare Other | Source: Ambulatory Visit | Attending: Family Medicine | Admitting: Family Medicine

## 2013-02-06 DIAGNOSIS — Z9011 Acquired absence of right breast and nipple: Secondary | ICD-10-CM

## 2013-02-06 DIAGNOSIS — Z853 Personal history of malignant neoplasm of breast: Secondary | ICD-10-CM

## 2013-02-06 DIAGNOSIS — I10 Essential (primary) hypertension: Secondary | ICD-10-CM | POA: Insufficient documentation

## 2013-02-06 DIAGNOSIS — Z1231 Encounter for screening mammogram for malignant neoplasm of breast: Secondary | ICD-10-CM | POA: Diagnosis not present

## 2013-02-06 NOTE — Assessment & Plan Note (Signed)
ACE inhibitors are problematic in  pts with airway complaints because  even experienced pulmonologists can't always distinguish ace effects from copd/asthma.  By themselves they don't actually cause a problem, much like oxygen can't by itself start a fire, but they certainly serve as a powerful catalyst or enhancer for any "fire"  or inflammatory process in the upper airway, be it caused by an ET  tube or more commonly reflux (especially in the obese or pts with known GERD or who are on biphoshonates).    In the era of ARB near equivalency until we have a better handle on the reversibility of the airway problem, it just makes sense to avoid ACEI  entirely in the short run and then decide later, having established a level of airway control using a reasonable limited regimen, whether to add back ace but even then being very careful to observe the pt for worsening airway control and number of meds used/ needed to control symptoms.    Stop lotrel, start exforge 5/320 one daily

## 2013-02-06 NOTE — Assessment & Plan Note (Addendum)
Classic Upper airway cough syndrome, so named because it's frequently impossible to sort out how much is  CR/sinusitis with freq throat clearing (which can be related to primary GERD)   vs  causing  secondary (" extra esophageal")  GERD from wide swings in gastric pressure that occur with throat clearing, often  promoting self use of mint and menthol lozenges that reduce the lower esophageal sphincter tone and exacerbate the problem further in a cyclical fashion.   These are the same pts (now being labeled as having "irritable larynx syndrome" by some cough centers) who not infrequently have a history of having failed to tolerate ace inhibitors,  dry powder inhalers or biphosphonates or report having atypical reflux symptoms that don't respond to standard doses of PPI , and are easily confused as having aecopd or asthma flares by even experienced allergists/ pulmonologists.  She is a never smoker with new onset  prominent hoarseness, dry cough and refractory "asthma"  symptoms (even to steroids) so first needs a trial off acei then regroup. See HBP  ? Acid (or non-acid) GERD > always difficult to exclude as up to 75% of pts in some series report no assoc GI/ Heartburn symptoms> rec (24h)  acid suppression and diet restrictions/ reviewed and instructions given in writting (should stay on until cough is resolved due to concerns the cough may generate secondary gerd  The proper method of use, as well as anticipated side effects, of a metered-dose inhaler are discussed and demonstrated to the patient. Improved effectiveness after extensive coaching during this visit to a level of approximately  50% and ok to continue to use saba prn but should find w/in a few weeks does not need saba or cough meds if acei is the case.

## 2013-02-19 ENCOUNTER — Encounter: Payer: Self-pay | Admitting: Adult Health

## 2013-02-19 ENCOUNTER — Ambulatory Visit (INDEPENDENT_AMBULATORY_CARE_PROVIDER_SITE_OTHER): Payer: Medicare Other | Admitting: Adult Health

## 2013-02-19 VITALS — BP 134/66 | HR 66 | Temp 97.3°F | Ht 65.0 in | Wt 148.8 lb

## 2013-02-19 DIAGNOSIS — R05 Cough: Secondary | ICD-10-CM | POA: Diagnosis not present

## 2013-02-19 DIAGNOSIS — I10 Essential (primary) hypertension: Secondary | ICD-10-CM | POA: Diagnosis not present

## 2013-02-19 MED ORDER — AMLODIPINE BESYLATE 5 MG PO TABS: 5.0000 mg | ORAL_TABLET | Freq: Every day | ORAL | Status: DC

## 2013-02-19 NOTE — Progress Notes (Signed)
CC: [name], [name]  History of Present Illness: [name] is a 79 y.o. Female never smoker with cough.  She had prior history of Lt pleuritic chest pain with small pleural effusion which resolved.  [name] eval 11/28/12  She has occasional cough with clear sputum.  She uses her symbicort 3 to 4 times per week, and this helps.  rec Proair two puffs up to four times per day as needed for cough, wheeze, or chest congestion   02/05/2013 acute  ov/[facility] re: chronic cough and wheeze on ACEi Chief Complaint  Patient presents with  . Acute Visit    Pt c/o cough x 4 wks- occ prod with minimal clear sputum.  She also c/o some wheezing. Using rescue inhaler twice per day.    Onset was insidious, symptoms progressive x months despite  rx  with abx, steroids x 3, symbicort, sob mostly with coughing.   >>d/c Lotrel   02/19/2013 Follow up  2 week follow up cough. Reports is not much better. Still having cough occasionally producing clear mucus w/ some wheezing and SABA use twice daily. Patient was seen last visit, and taken off her Lotrel. She was placed on Pepcid twice daily. Patient reports that cough is slightly improved, however, continues to have a intermittent cough is mainly clear to white mucus. She is using Tussin. for her cough, mainly. She was taken off of Lotrel and changed over to exforge.  Patient denies any hemoptysis, orthopnea, PND, leg swelling.    Current Medications, Allergies, Complete Past Medical History, Past Surgical History, Family History, and Social History were reviewed in [employer] record.  ROS  The following are not active complaints unless bolded sore throat, dysphagia, dental problems, itching, sneezing,  nasal congestion or excess/ purulent secretions, ear ache,   fever, chills, sweats, unintended wt loss, pleuritic or exertional cp, hemoptysis,  orthopnea pnd or leg swelling, presyncope, palpitations, heartburn, abdominal  pain, anorexia, nausea, vomiting, diarrhea  or change in bowel or urinary habits, change in stools or urine, dysuria,hematuria,  rash, arthralgias, visual complaints, headache, numbness weakness or ataxia or problems with walking or coordination,  change in mood/affect or memory.         Tests: CT chest 09/14/11>>small left pleural effusion ANA 10/18/11>>1:640 with speckled pattern V/Q scan 10/28/11>>low probability ANA 01/14/12>>1:160 with speckled pattern, ESR 51 Spirometry 06/22/12 >> FEV1 1.37 (73%), FEV1% 73  [name]   has a past medical history of Diabetes mellitus; Hypertension; Hypercholesterolemia; Osteoarthritis; Breast cancer; CKD (chronic kidney disease), stage III; Carotid artery stenosis; Mild mitral regurgitation; Pernicious anemia; and Pleural effusion.  [name]  has past surgical history that includes Mastectomy; right knee replacement; right shoulder surgery; and Tonsillectomy.    Physical Exam: GEN: A/Ox3; pleasant , NAD, elderly   HEENT:  [place]/AT,  EACs-clear, TMs-wnl, NOSE-clear, THROAT-clear, no lesions, no postnasal drip or exudate noted.   NECK:  Supple w/ fair ROM; no JVD; normal carotid impulses w/o bruits; no thyromegaly or nodules palpated; no lymphadenopathy.  RESP  Coarse BS  w/o, wheezes/ rales/ or rhonchi.no accessory muscle use, no dullness to percussion  CARD:  RRR, no m/r/g  , no peripheral edema, pulses intact, no cyanosis or clubbing.  GI:   Soft & nt; nml bowel sounds; no organomegaly or masses detected.  Musco: Warm bil, no deformities or joint swelling noted.   Neuro: alert, no focal deficits noted.    Skin: Warm, no lesions or rashes

## 2013-02-19 NOTE — Patient Instructions (Addendum)
Remain off ACE inhibitor-Benazapril .  Stop Exforge.  Begin Amlodipine 5mg  daily .  Coninue to use Tussin as directed for cough  May use sips of water or sugarless candy to soothe throat.  Follow up Dr. Craige Cotta  In 6-8  weeks and As needed   Please contact office for sooner follow up if symptoms do not improve or worsen or seek emergency care

## 2013-02-19 NOTE — Assessment & Plan Note (Signed)
Blood pressure is under control. Would avoid ACE inhibitors in the future. We'll stop her Exforge She is to continue on Diovan. And begin amlodipine 5 mg. She is to follow up with her family doctor for blood pressure control

## 2013-02-19 NOTE — Progress Notes (Signed)
Reviewed and agree with assessment/plan. 

## 2013-02-19 NOTE — Assessment & Plan Note (Addendum)
Cyclical cough . Patient is advised to remain off of ACE inhibitor as it may be a contributing to her persistent cough. She's been advised on cough control measures, including, reflux, and rhinitis prevention. Would avoid ACE in future Will need to change exforge since on Diovan   Plan  Remain off ACE inhibitor-Benazapril .  Stop Exforge.  Begin Amlodipine 5mg  daily .  Coninue to use Tussin as directed for cough  May use sips of water or sugarless candy to soothe throat.  Follow up Dr. Craige Cotta  In 6-8  weeks and As needed   Please contact office for sooner follow up if symptoms do not improve or worsen or seek emergency care

## 2013-03-05 ENCOUNTER — Telehealth: Payer: Self-pay | Admitting: Pulmonary Disease

## 2013-03-05 NOTE — Telephone Encounter (Signed)
OV 02/19/13: Patient Instructions      Remain off ACE inhibitor-Benazapril .   Stop Exforge.   Begin Amlodipine 5mg  daily .   Coninue to use Tussin as directed for cough   May use sips of water or sugarless candy to soothe throat.   Follow up Dr. Craige Cotta  In 6-8  weeks and As needed    Please contact office for sooner follow up if symptoms do not improve or worsen or seek emergency care   ---  She reports she does not usually cough much during the day but worse at night. She also has wheezing at night and will wake herself up. She reports if she coughs long enough she will eventually cough up phlem. She takes the tussin at night if he cough is too bad. She also noticed SOB with the coughing. She wants to know how much longer should she expect to be coughing since coming off the ACE? Please advise Dr. Craige Cotta thanks

## 2013-03-05 NOTE — Telephone Encounter (Signed)
Please advise that it can take 4 to 6 weeks off ACE inhibitor before she should expect to have improvement with her cough.

## 2013-03-05 NOTE — Telephone Encounter (Signed)
I called and made pt aware. Nothing further needed 

## 2013-03-06 DIAGNOSIS — I129 Hypertensive chronic kidney disease with stage 1 through stage 4 chronic kidney disease, or unspecified chronic kidney disease: Secondary | ICD-10-CM | POA: Diagnosis not present

## 2013-03-08 ENCOUNTER — Encounter: Payer: Self-pay | Admitting: General Surgery

## 2013-03-08 DIAGNOSIS — I340 Nonrheumatic mitral (valve) insufficiency: Secondary | ICD-10-CM

## 2013-03-08 DIAGNOSIS — I119 Hypertensive heart disease without heart failure: Secondary | ICD-10-CM

## 2013-03-08 DIAGNOSIS — I359 Nonrheumatic aortic valve disorder, unspecified: Secondary | ICD-10-CM | POA: Insufficient documentation

## 2013-03-08 DIAGNOSIS — I495 Sick sinus syndrome: Secondary | ICD-10-CM | POA: Insufficient documentation

## 2013-03-09 ENCOUNTER — Encounter: Payer: Self-pay | Admitting: Cardiology

## 2013-03-09 ENCOUNTER — Encounter: Payer: Self-pay | Admitting: Internal Medicine

## 2013-03-09 ENCOUNTER — Encounter: Payer: Self-pay | Admitting: Pulmonary Disease

## 2013-03-09 ENCOUNTER — Ambulatory Visit (INDEPENDENT_AMBULATORY_CARE_PROVIDER_SITE_OTHER): Payer: Medicare Other | Admitting: Pulmonary Disease

## 2013-03-09 ENCOUNTER — Ambulatory Visit: Payer: Medicare Other | Admitting: Cardiology

## 2013-03-09 ENCOUNTER — Other Ambulatory Visit (HOSPITAL_COMMUNITY): Payer: Self-pay | Admitting: Cardiology

## 2013-03-09 ENCOUNTER — Ambulatory Visit (INDEPENDENT_AMBULATORY_CARE_PROVIDER_SITE_OTHER): Payer: Medicare Other | Admitting: Cardiology

## 2013-03-09 ENCOUNTER — Ambulatory Visit (HOSPITAL_COMMUNITY): Payer: Medicare Other | Attending: Internal Medicine | Admitting: Radiology

## 2013-03-09 VITALS — BP 157/67 | HR 71 | Ht 63.0 in | Wt 144.0 lb

## 2013-03-09 VITALS — BP 120/70 | HR 74 | Temp 97.9°F | Ht 65.0 in | Wt 145.4 lb

## 2013-03-09 DIAGNOSIS — I10 Essential (primary) hypertension: Secondary | ICD-10-CM

## 2013-03-09 DIAGNOSIS — I359 Nonrheumatic aortic valve disorder, unspecified: Secondary | ICD-10-CM

## 2013-03-09 DIAGNOSIS — R05 Cough: Secondary | ICD-10-CM | POA: Diagnosis not present

## 2013-03-09 DIAGNOSIS — I495 Sick sinus syndrome: Secondary | ICD-10-CM

## 2013-03-09 DIAGNOSIS — R059 Cough, unspecified: Secondary | ICD-10-CM

## 2013-03-09 DIAGNOSIS — I059 Rheumatic mitral valve disease, unspecified: Secondary | ICD-10-CM

## 2013-03-09 DIAGNOSIS — R062 Wheezing: Secondary | ICD-10-CM

## 2013-03-09 DIAGNOSIS — I340 Nonrheumatic mitral (valve) insufficiency: Secondary | ICD-10-CM

## 2013-03-09 MED ORDER — ALBUTEROL SULFATE HFA 108 (90 BASE) MCG/ACT IN AERS: 2.0000 | INHALATION_SPRAY | Freq: Four times a day (QID) | RESPIRATORY_TRACT | Status: DC | PRN

## 2013-03-09 MED ORDER — BUDESONIDE-FORMOTEROL FUMARATE 160-4.5 MCG/ACT IN AERO: 2.0000 | INHALATION_SPRAY | Freq: Two times a day (BID) | RESPIRATORY_TRACT | Status: DC

## 2013-03-09 NOTE — Progress Notes (Signed)
Chief Complaint  Patient presents with  . ACUTE OFFICE VISIT    Pt c/o increased wheezing and cough. Sent back by Dr Mayford Knife.     CC: [name], [name]  History of Present Illness: [name] is a 79 y.o. female with cough.  She had prior history of Lt pleuritic chest pain with small pleural effusion which has resolved.  She was seen by Dr. Sherene Sires in November for cough.  She had lotrel stopped.  Her cough has improved some.  She was seen in cardiology office earlier today, and noted to have more wheezing.  She was advised to come to office today for further assessment.  She still has trouble with a cough.  She is bringing up yellow sputum.  She gets wheezing in her chest, and this can happen at night.  She has been using proair several times per week for the last few weeks, but ran out of this a few days ago.  Her wheezing would improve after using proair.  She was previously on symbicort, but has not used this since September.  She felt this helped when she used it.  She denies fever, sinus congestion, sore throat, dysphagia, or heartburn.  She has been using pepcid, but this has not helped with her cough.  Tests: CT chest 09/14/11>>small left pleural effusion ANA 10/18/11>>1:640 with speckled pattern V/Q scan 10/28/11>>low probability ANA 01/14/12>>1:160 with speckled pattern, ESR 51 Spirometry 06/22/12 >> FEV1 1.37 (73%), FEV1% 73 Echo 03/09/13 >> mild LVH, EF 55%, grade 2 diastolic dysfx, mild AS, mild MR, mod LA dilation  Kathelene L [name]   has a past medical history of Osteoarthritis; Mild mitral regurgitation; Pernicious anemia; Pleural effusion; Diabetes mellitus; Hypercholesterolemia; Breast cancer (1991); CKD (chronic kidney disease), stage III; Carotid artery stenosis (08/2011); History of echocardiogram (02/2010); RLS (restless legs syndrome); COPD (chronic obstructive pulmonary disease) (02/2011); Pleuritis; and Hypertension.  [name]  has past surgical history  that includes Mastectomy; right knee replacement; right shoulder surgery; and Tonsillectomy.  Outpatient Encounter Prescriptions as of 03/09/2013  Medication Sig  . albuterol (PROAIR HFA) 108 (90 BASE) MCG/ACT inhaler Inhale 2 puffs into the lungs every 6 (six) hours as needed for wheezing.  Marland Kitchen amLODipine (NORVASC) 10 MG tablet Take 10 mg by mouth daily.  Marland Kitchen aspirin 81 MG tablet Take 81 mg by mouth daily.  . calcitRIOL (ROCALTROL) 0.25 MCG capsule Take 0.25 mcg by mouth daily.  Marland Kitchen DIOVAN 320 MG tablet Take 1 tablet by mouth daily.  . furosemide (LASIX) 40 MG tablet Take 40 mg by mouth daily.   Marland Kitchen gabapentin (NEURONTIN) 100 MG capsule Take 100 mg by mouth 3 (three) times daily.  Marland Kitchen guaiFENesin (ROBITUSSIN) 100 MG/5ML liquid Take 200 mg by mouth 3 (three) times daily as needed for cough.  . hydrOXYzine (ATARAX/VISTARIL) 25 MG tablet Take 25 mg by mouth at bedtime as needed. For itching  . insulin NPH (NOVOLIN N) 100 UNIT/ML injection Inject 30 Units into the skin daily.  . Omega-3 Fatty Acids (FISH OIL) 1000 MG CAPS Take 1 capsule by mouth 2 (two) times daily.    Allergies  Allergen Reactions  . Atorvastatin Other (See Comments)    LEG CRAMPS  . Bisphosphonates     Contraindicated due to renal disease  . Ceftin [Cefuroxime]     RASH  . Penicillins     rash  . Shellfish Allergy     hives  . Simvastatin Other (See Comments)    LEG CRAMPS  .  Zetia [Ezetimibe]     hives   Review of Systems  Constitutional: Negative for fever and unexpected weight change.  HENT: Negative for congestion, dental problem, ear pain, nosebleeds, postnasal drip, rhinorrhea, sinus pressure, sneezing, sore throat and trouble swallowing.   Eyes: Negative for redness and itching.  Respiratory: Positive for cough, shortness of breath and wheezing. Negative for chest tightness.   Cardiovascular: Negative for palpitations and leg swelling.  Gastrointestinal: Negative for nausea and vomiting.  Genitourinary: Negative  for dysuria.  Musculoskeletal: Negative for joint swelling.  Skin: Negative for rash.  Neurological: Negative for headaches.  Hematological: Does not bruise/bleed easily.  Psychiatric/Behavioral: Negative for dysphoric mood. The patient is not nervous/anxious.    Physical Exam:  General - no distress ENT - no sinus tenderness, no oral exudate, no LAN Cardiac - s1s2 regular, no murmur Chest - b/l wheezing Back - no tenderness Abd - soft, non-tender Ext - no edema Neuro - normal strength  Skin - no rashes Psych - normal mood, and behavior  Assessment/Plan:  Jillian Helling, MD [facility]/Sleep Pager:  [phone] 03/09/2013, 4:19 PM

## 2013-03-09 NOTE — Patient Instructions (Signed)
Your physician recommends that you continue on your current medications as directed. Please refer to the Current Medication list given to you today.  Your physician wants you to follow-up in: 6 Months with Dr Sherlyn Lick will receive a reminder letter in the mail two months in advance. If you don't receive a letter, please call our office to schedule the follow-up appointment.

## 2013-03-09 NOTE — Assessment & Plan Note (Addendum)
Likely multifactorial.    She likely had contribution from ACE inhibitor use, and has some improvement.  I don't think her current symptoms can be ascribed to ACE inhibitor reaction.  I am concerned she has asthma.  She had improvement in wheezing with nebulizer therapy in office today.  I don't think she needs prednisone or antibiotics at this time.  Will also defer chest imaging at this time >> clinically no evidence for recurrent of her pleural effusion/pleuritis.  Will resume symbicort and renew proair.  Have given sample of symbicort.  She does not have symptoms of reflux, and has not noticed benefit from pepcid >> will have her stop this and monitor her symptoms.

## 2013-03-09 NOTE — Progress Notes (Deleted)
   Subjective:    Patient ID: [name], female    DOB: 01/02/1927, 79 y.o.   MRN: [id number]  HPI    Review of Systems  Constitutional: Negative for fever and unexpected weight change.  HENT: Negative for congestion, dental problem, ear pain, nosebleeds, postnasal drip, rhinorrhea, sinus pressure, sneezing, sore throat and trouble swallowing.   Eyes: Negative for redness and itching.  Respiratory: Positive for cough, shortness of breath and wheezing. Negative for chest tightness.   Cardiovascular: Negative for palpitations and leg swelling.  Gastrointestinal: Negative for nausea and vomiting.  Genitourinary: Negative for dysuria.  Musculoskeletal: Negative for joint swelling.  Skin: Negative for rash.  Neurological: Negative for headaches.  Hematological: Does not bruise/bleed easily.  Psychiatric/Behavioral: Negative for dysphoric mood. The patient is not nervous/anxious.        Objective:   Physical Exam        Assessment & Plan:

## 2013-03-09 NOTE — Progress Notes (Signed)
Echocardiogram performed.  

## 2013-03-09 NOTE — Patient Instructions (Signed)
Symbicort two puffs twice per day, and rinse mouth after each use Proair two puffs up to four times per day as needed for cough, wheeze, or chest congestion Can stop taking pepcid Follow up as schedule on April 12, 2013

## 2013-03-09 NOTE — Progress Notes (Signed)
[address] [place], [place]  [address] Phone: [phone] Fax:  [phone]  Date:  03/09/2013   ID:  [name], DOB 1927/03/26, MRN [id number]  PCP:  Lupita Raider, MD  Cardiologist:  Armanda Magic, MD     History of Present Illness: [name] is a 79 y.o. female with a history of mild AS, mild MR and HTN.  She is doing well.  She denies any chest pain, LE edema, dizziness, palpitations or syncope.  SHe has had a chronic cough from ACE I and is improving after coming off of Lotrel.  She has some mild SOB only when coughing.     Wt Readings from Last 3 Encounters:  03/09/13 144 lb (65.318 kg)  03/08/13 147 lb 6.4 oz (66.86 kg)  02/19/13 148 lb 12.8 oz (67.495 kg)     Past Medical History  Diagnosis Date  . Osteoarthritis   . Mild mitral regurgitation   . Pernicious anemia   . Pleural effusion   . Diabetes mellitus     type 2 w complications of proteinuria and retinopathy  . Hypercholesterolemia   . Breast cancer 1991    right  . CKD (chronic kidney disease), stage III     from [facility]-Dr. Deterding  . Carotid artery stenosis 08/2011    nonobstructive 50-70% by doppler  . History of echocardiogram 02/2010    mild to mod AS, mildly thickened MV with MAC and Mild MR   . RLS (restless legs syndrome)     /insomnia-hydroxizine works well..  . COPD (chronic obstructive pulmonary disease) 02/2011    cxr  . Pleuritis     Dr Craige Cotta  . Hypertension     Current Outpatient Prescriptions  Medication Sig Dispense Refill  . albuterol (PROAIR HFA) 108 (90 BASE) MCG/ACT inhaler Inhale 2 puffs into the lungs every 6 (six) hours as needed for wheezing.  1 Inhaler  3  . amLODipine (NORVASC) 10 MG tablet Take 10 mg by mouth daily.      Marland Kitchen aspirin 81 MG tablet Take 81 mg by mouth daily.      . calcitRIOL (ROCALTROL) 0.25 MCG capsule Take 0.25 mcg by mouth daily.      Marland Kitchen DIOVAN 320 MG tablet Take 1 tablet by mouth daily.      . furosemide (LASIX) 40 MG tablet Take 40  mg by mouth daily.       Marland Kitchen gabapentin (NEURONTIN) 100 MG capsule Take 100 mg by mouth 3 (three) times daily.      Marland Kitchen guaiFENesin (ROBITUSSIN) 100 MG/5ML liquid Take 200 mg by mouth 3 (three) times daily as needed for cough.      . hydrOXYzine (ATARAX/VISTARIL) 25 MG tablet Take 25 mg by mouth at bedtime as needed. For itching      . insulin NPH (NOVOLIN N) 100 UNIT/ML injection Inject 30 Units into the skin daily.      . Omega-3 Fatty Acids (FISH OIL) 1000 MG CAPS Take 1 capsule by mouth 2 (two) times daily.       No current facility-administered medications for this visit.    Allergies:    Allergies  Allergen Reactions  . Atorvastatin Other (See Comments)    LEG CRAMPS  . Bisphosphonates     Contraindicated due to renal disease  . Ceftin [Cefuroxime]     RASH  . Penicillins     rash  . Shellfish Allergy     hives  . Simvastatin Other (See  Comments)    LEG CRAMPS  . Zetia [Ezetimibe]     hives    Social History:  The patient  reports that she has never smoked. She has never used smokeless tobacco. She reports that she does not drink alcohol or use illicit drugs.   Family History:  The patient's family history includes Emphysema in her father; Lymphoma in her father.   ROS:  Please see the history of present illness.      All other systems reviewed and negative.   PHYSICAL EXAM: VS:  BP 157/67  Pulse 71  Ht 5\' 3"  (1.6 m)  Wt 144 lb (65.318 kg)  BMI 25.51 kg/m2 Well nourished, well developed, in no acute distress HEENT: normal Neck: no JVD Cardiac:  normal S1, S2; RRR; no murmur Lungs:  Inspiratory and expiratory wheezes Abd: soft, nontender, no hepatomegaly Ext: no edema Skin: warm and dry Neuro:  CNs 2-12 intact, no focal abnormalities noted       ASSESSMENT AND PLAN:  1. Mild to moderate AS - echo today showed stable mild AS 2. Mild MR 3.   HTN - elevated today but her Lotrel was just stopped due to ACE induced cough and she was started on amlodipine which was  just increased to 10mg  daily.   4.  Abnormal lung sound with inspiratory and expiratory wheezing and cough with SOB - I will see if we can get her seen by her Pulmonologist today.  She recently saw Dr. Craige Cotta and was told her lungs were clear.  I have spoken to  pulmonary and they will see her today  Followup with me in 6 months   Signed, Armanda Magic, MD 03/09/2013 3:13 PM

## 2013-03-12 DIAGNOSIS — M1710 Unilateral primary osteoarthritis, unspecified knee: Secondary | ICD-10-CM | POA: Diagnosis not present

## 2013-03-28 ENCOUNTER — Encounter (HOSPITAL_COMMUNITY): Payer: Medicare Other

## 2013-04-02 DIAGNOSIS — J209 Acute bronchitis, unspecified: Secondary | ICD-10-CM | POA: Diagnosis not present

## 2013-04-12 ENCOUNTER — Ambulatory Visit: Payer: Medicare Other | Admitting: Pulmonary Disease

## 2013-04-17 DIAGNOSIS — R5381 Other malaise: Secondary | ICD-10-CM | POA: Diagnosis not present

## 2013-04-17 DIAGNOSIS — R5383 Other fatigue: Secondary | ICD-10-CM | POA: Diagnosis not present

## 2013-04-24 DIAGNOSIS — M1710 Unilateral primary osteoarthritis, unspecified knee: Secondary | ICD-10-CM | POA: Diagnosis not present

## 2013-05-08 ENCOUNTER — Other Ambulatory Visit: Payer: Self-pay | Admitting: Family Medicine

## 2013-05-08 ENCOUNTER — Ambulatory Visit
Admission: RE | Admit: 2013-05-08 | Discharge: 2013-05-08 | Disposition: A | Discharge status: Home / Self Care | Payer: Medicare Other | Source: Ambulatory Visit | Attending: Family Medicine | Admitting: Family Medicine

## 2013-05-08 DIAGNOSIS — R05 Cough: Secondary | ICD-10-CM | POA: Diagnosis not present

## 2013-05-08 DIAGNOSIS — R059 Cough, unspecified: Secondary | ICD-10-CM

## 2013-05-10 DIAGNOSIS — E785 Hyperlipidemia, unspecified: Secondary | ICD-10-CM | POA: Diagnosis not present

## 2013-05-10 DIAGNOSIS — E213 Hyperparathyroidism, unspecified: Secondary | ICD-10-CM | POA: Diagnosis not present

## 2013-05-10 DIAGNOSIS — N183 Chronic kidney disease, stage 3 unspecified: Secondary | ICD-10-CM | POA: Diagnosis not present

## 2013-05-10 DIAGNOSIS — D638 Anemia in other chronic diseases classified elsewhere: Secondary | ICD-10-CM | POA: Diagnosis not present

## 2013-05-30 ENCOUNTER — Ambulatory Visit (HOSPITAL_COMMUNITY): Payer: Medicare Other | Attending: Cardiology

## 2013-05-30 DIAGNOSIS — I6529 Occlusion and stenosis of unspecified carotid artery: Secondary | ICD-10-CM | POA: Diagnosis not present

## 2013-06-01 ENCOUNTER — Other Ambulatory Visit: Payer: Self-pay | Admitting: General Surgery

## 2013-06-01 DIAGNOSIS — I6529 Occlusion and stenosis of unspecified carotid artery: Secondary | ICD-10-CM

## 2013-06-28 DIAGNOSIS — E1139 Type 2 diabetes mellitus with other diabetic ophthalmic complication: Secondary | ICD-10-CM | POA: Diagnosis not present

## 2013-06-28 DIAGNOSIS — E11359 Type 2 diabetes mellitus with proliferative diabetic retinopathy without macular edema: Secondary | ICD-10-CM | POA: Diagnosis not present

## 2013-06-28 DIAGNOSIS — Z961 Presence of intraocular lens: Secondary | ICD-10-CM | POA: Diagnosis not present

## 2013-06-28 DIAGNOSIS — H4011X0 Primary open-angle glaucoma, stage unspecified: Secondary | ICD-10-CM | POA: Diagnosis not present

## 2013-06-28 DIAGNOSIS — H04129 Dry eye syndrome of unspecified lacrimal gland: Secondary | ICD-10-CM | POA: Diagnosis not present

## 2013-07-09 DIAGNOSIS — I1310 Hypertensive heart and chronic kidney disease without heart failure, with stage 1 through stage 4 chronic kidney disease, or unspecified chronic kidney disease: Secondary | ICD-10-CM | POA: Diagnosis not present

## 2013-07-09 DIAGNOSIS — E1165 Type 2 diabetes mellitus with hyperglycemia: Secondary | ICD-10-CM | POA: Diagnosis not present

## 2013-07-09 DIAGNOSIS — I6529 Occlusion and stenosis of unspecified carotid artery: Secondary | ICD-10-CM | POA: Diagnosis not present

## 2013-07-09 DIAGNOSIS — Z23 Encounter for immunization: Secondary | ICD-10-CM | POA: Diagnosis not present

## 2013-07-09 DIAGNOSIS — E1159 Type 2 diabetes mellitus with other circulatory complications: Secondary | ICD-10-CM | POA: Diagnosis not present

## 2013-07-09 DIAGNOSIS — I739 Peripheral vascular disease, unspecified: Secondary | ICD-10-CM | POA: Diagnosis not present

## 2013-07-09 DIAGNOSIS — E782 Mixed hyperlipidemia: Secondary | ICD-10-CM | POA: Diagnosis not present

## 2013-07-09 DIAGNOSIS — E1139 Type 2 diabetes mellitus with other diabetic ophthalmic complication: Secondary | ICD-10-CM | POA: Diagnosis not present

## 2013-07-09 DIAGNOSIS — E1129 Type 2 diabetes mellitus with other diabetic kidney complication: Secondary | ICD-10-CM | POA: Diagnosis not present

## 2013-07-09 DIAGNOSIS — N183 Chronic kidney disease, stage 3 unspecified: Secondary | ICD-10-CM | POA: Diagnosis not present

## 2013-07-19 DIAGNOSIS — H35369 Drusen (degenerative) of macula, unspecified eye: Secondary | ICD-10-CM | POA: Diagnosis not present

## 2013-07-19 DIAGNOSIS — H43399 Other vitreous opacities, unspecified eye: Secondary | ICD-10-CM | POA: Diagnosis not present

## 2013-07-19 DIAGNOSIS — E1139 Type 2 diabetes mellitus with other diabetic ophthalmic complication: Secondary | ICD-10-CM | POA: Diagnosis not present

## 2013-07-19 DIAGNOSIS — E11359 Type 2 diabetes mellitus with proliferative diabetic retinopathy without macular edema: Secondary | ICD-10-CM | POA: Diagnosis not present

## 2013-08-07 DIAGNOSIS — M1710 Unilateral primary osteoarthritis, unspecified knee: Secondary | ICD-10-CM | POA: Diagnosis not present

## 2013-08-14 DIAGNOSIS — H4011X0 Primary open-angle glaucoma, stage unspecified: Secondary | ICD-10-CM | POA: Diagnosis not present

## 2013-08-14 DIAGNOSIS — H01029 Squamous blepharitis unspecified eye, unspecified eyelid: Secondary | ICD-10-CM | POA: Diagnosis not present

## 2013-08-14 DIAGNOSIS — H1045 Other chronic allergic conjunctivitis: Secondary | ICD-10-CM | POA: Diagnosis not present

## 2013-08-14 DIAGNOSIS — H04129 Dry eye syndrome of unspecified lacrimal gland: Secondary | ICD-10-CM | POA: Diagnosis not present

## 2013-08-14 DIAGNOSIS — H02839 Dermatochalasis of unspecified eye, unspecified eyelid: Secondary | ICD-10-CM | POA: Diagnosis not present

## 2013-08-28 DIAGNOSIS — H4011X0 Primary open-angle glaucoma, stage unspecified: Secondary | ICD-10-CM | POA: Diagnosis not present

## 2013-08-28 DIAGNOSIS — Z961 Presence of intraocular lens: Secondary | ICD-10-CM | POA: Diagnosis not present

## 2013-09-03 DIAGNOSIS — N183 Chronic kidney disease, stage 3 unspecified: Secondary | ICD-10-CM | POA: Diagnosis not present

## 2013-09-03 DIAGNOSIS — N039 Chronic nephritic syndrome with unspecified morphologic changes: Secondary | ICD-10-CM | POA: Diagnosis not present

## 2013-09-03 DIAGNOSIS — I129 Hypertensive chronic kidney disease with stage 1 through stage 4 chronic kidney disease, or unspecified chronic kidney disease: Secondary | ICD-10-CM | POA: Diagnosis not present

## 2013-09-03 DIAGNOSIS — D631 Anemia in chronic kidney disease: Secondary | ICD-10-CM | POA: Diagnosis not present

## 2013-09-03 DIAGNOSIS — N2581 Secondary hyperparathyroidism of renal origin: Secondary | ICD-10-CM | POA: Diagnosis not present

## 2013-10-11 ENCOUNTER — Other Ambulatory Visit: Payer: Self-pay

## 2013-10-11 MED ORDER — VALSARTAN 320 MG PO TABS: 320.0000 mg | ORAL_TABLET | Freq: Every day | ORAL | Status: DC

## 2013-10-17 DIAGNOSIS — M1710 Unilateral primary osteoarthritis, unspecified knee: Secondary | ICD-10-CM | POA: Diagnosis not present

## 2013-10-17 DIAGNOSIS — M20019 Mallet finger of unspecified finger(s): Secondary | ICD-10-CM | POA: Diagnosis not present

## 2013-10-24 DIAGNOSIS — M1710 Unilateral primary osteoarthritis, unspecified knee: Secondary | ICD-10-CM | POA: Diagnosis not present

## 2013-10-31 DIAGNOSIS — M1710 Unilateral primary osteoarthritis, unspecified knee: Secondary | ICD-10-CM | POA: Diagnosis not present

## 2013-10-31 DIAGNOSIS — M20019 Mallet finger of unspecified finger(s): Secondary | ICD-10-CM | POA: Diagnosis not present

## 2013-11-17 DIAGNOSIS — J209 Acute bronchitis, unspecified: Secondary | ICD-10-CM | POA: Diagnosis not present

## 2013-11-29 DIAGNOSIS — M1710 Unilateral primary osteoarthritis, unspecified knee: Secondary | ICD-10-CM | POA: Diagnosis not present

## 2013-11-30 ENCOUNTER — Encounter: Payer: Self-pay | Admitting: Cardiology

## 2013-11-30 ENCOUNTER — Ambulatory Visit
Admission: RE | Admit: 2013-11-30 | Discharge: 2013-11-30 | Disposition: A | Discharge status: Home / Self Care | Payer: Medicare Other | Source: Ambulatory Visit | Attending: Cardiology | Admitting: Cardiology

## 2013-11-30 ENCOUNTER — Ambulatory Visit (INDEPENDENT_AMBULATORY_CARE_PROVIDER_SITE_OTHER): Payer: Medicare Other | Admitting: Cardiology

## 2013-11-30 VITALS — BP 156/68 | HR 66 | Ht 65.0 in | Wt 144.0 lb

## 2013-11-30 DIAGNOSIS — I6529 Occlusion and stenosis of unspecified carotid artery: Secondary | ICD-10-CM

## 2013-11-30 DIAGNOSIS — I059 Rheumatic mitral valve disease, unspecified: Secondary | ICD-10-CM | POA: Diagnosis not present

## 2013-11-30 DIAGNOSIS — I10 Essential (primary) hypertension: Secondary | ICD-10-CM | POA: Diagnosis not present

## 2013-11-30 DIAGNOSIS — R062 Wheezing: Secondary | ICD-10-CM

## 2013-11-30 DIAGNOSIS — I359 Nonrheumatic aortic valve disorder, unspecified: Secondary | ICD-10-CM | POA: Diagnosis not present

## 2013-11-30 DIAGNOSIS — R059 Cough, unspecified: Secondary | ICD-10-CM | POA: Diagnosis not present

## 2013-11-30 DIAGNOSIS — I340 Nonrheumatic mitral (valve) insufficiency: Secondary | ICD-10-CM

## 2013-11-30 DIAGNOSIS — R05 Cough: Secondary | ICD-10-CM | POA: Diagnosis not present

## 2013-11-30 LAB — BASIC METABOLIC PANEL
BUN: 28 mg/dL — ABNORMAL HIGH (ref 6–23)
CHLORIDE: 103 meq/L (ref 96–112)
CO2: 27 mEq/L (ref 19–32)
Calcium: 9.5 mg/dL (ref 8.4–10.5)
Creatinine, Ser: 1.2 mg/dL (ref 0.4–1.2)
GFR: 43.72 mL/min — AB (ref >60.00)
Glucose, Bld: 109 mg/dL — ABNORMAL HIGH (ref 70–99)
POTASSIUM: 4.8 meq/L (ref 3.5–5.1)
SODIUM: 136 meq/L (ref 135–145)

## 2013-11-30 NOTE — Progress Notes (Signed)
[place], [place],   [address] Phone: [phone] Fax:  [phone]  Date:  11/30/2013   ID:  [name], DOB 1927-01-19, MRN [id number]  PCP:  Mayra Neer, MD  Cardiologist:  Fransico Him, MD     History of Present Illness: [name] is a 80 y.o. female with a history of mild AS, mild MR and HTN. She is doing well. She denies any chest pain, LE edema, dizziness, palpitations or syncope. She has some SOB due to a URI a few weeks ago but her SOB has resolved    Wt Readings from Last 3 Encounters:  11/30/13 144 lb (65.318 kg)  03/09/13 145 lb 6.4 oz (65.953 kg)  03/09/13 144 lb (65.318 kg)     Past Medical History  Diagnosis Date  . Osteoarthritis   . Mild mitral regurgitation   . Pernicious anemia   . Pleural effusion   . Diabetes mellitus     type 2 w complications of proteinuria and retinopathy  . Hypercholesterolemia   . Breast cancer 1991    right  . CKD (chronic kidney disease), stage III     from [facility]-Dr. Deterding  . Carotid artery stenosis 08/2011    nonobstructive 50-70% by doppler  . History of echocardiogram 02/2010    mild to mod AS, mildly thickened MV with MAC and Mild MR   . RLS (restless legs syndrome)     /insomnia-hydroxizine works well..  . COPD (chronic obstructive pulmonary disease) 02/2011    cxr  . Pleuritis     Dr Halford Chessman  . Hypertension     Current Outpatient Prescriptions  Medication Sig Dispense Refill  . amLODipine (NORVASC) 10 MG tablet Take 10 mg by mouth daily.      Marland Kitchen aspirin 81 MG tablet Take 81 mg by mouth daily.      . calcitRIOL (ROCALTROL) 0.25 MCG capsule Take 0.25 mcg by mouth daily.      . furosemide (LASIX) 40 MG tablet Take 40 mg by mouth daily.       Marland Kitchen gabapentin (NEURONTIN) 100 MG capsule Take 100 mg by mouth 3 (three) times daily.      . hydrOXYzine (ATARAX/VISTARIL) 25 MG tablet Take 25 mg by mouth at bedtime as needed. For itching      . insulin NPH (NOVOLIN N) 100 UNIT/ML injection  Inject 30 Units into the skin daily.      Marland Kitchen latanoprost (XALATAN) 0.005 % ophthalmic solution       . Omega-3 Fatty Acids (FISH OIL) 1000 MG CAPS Take 1 capsule by mouth 2 (two) times daily.      Marland Kitchen ULTICARE INSULIN SYRINGE 31G X 5/16" 0.3 ML MISC       . valsartan (DIOVAN) 320 MG tablet Take 1 tablet (320 mg total) by mouth daily.  30 tablet  3   No current facility-administered medications for this visit.    Allergies:    Allergies  Allergen Reactions  . Atorvastatin Other (See Comments)    LEG CRAMPS  . Bisphosphonates     Contraindicated due to renal disease  . Ceftin [Cefuroxime]     RASH  . Penicillins     rash  . Shellfish Allergy     hives  . Simvastatin Other (See Comments)    LEG CRAMPS  . Zetia [Ezetimibe]     hives    Social History:  The patient  reports that she has never smoked. She has never used  smokeless tobacco. She reports that she does not drink alcohol or use illicit drugs.   Family History:  The patient's family history includes Emphysema in her father; Lymphoma in her father.   ROS:  Please see the history of present illness.      All other systems reviewed and negative.   PHYSICAL EXAM: VS:  BP 156/68  Pulse 66  Ht 5\' 5"  (1.651 m)  Wt 144 lb (65.318 kg)  BMI 23.96 kg/m2 Well nourished, well developed, in no acute distress HEENT: normal Neck: no JVD Cardiac:  normal S1, S2; RRR; no murmur Lungs:  clear to auscultation bilaterally, no wheezing, rhonchi or rales Abd: soft, nontender, no hepatomegaly Ext: no edema Skin: warm and dry Neuro:  CNs 2-12 intact, no focal abnormalities noted  EKG:     NSR with nonspecific ST abnormality  ASSESSMENT AND PLAN:  1. Mild  AS - by echo 02/2013 2. Mild MR       3.   HTN with borderline control - she is having orthopedic pain due to left knee pain - continue amlodipine/valsartan - check BMET - I have asked her to check her BP daily for a week and call with the results       4.  Wheezing on exam with  recent URI and rhonchi at left base - check Chest xray  Followup with me in 6 months       Signed, Fransico Him, MD 11/30/2013 10:40 AM

## 2013-11-30 NOTE — Patient Instructions (Signed)
Your physician recommends that you continue on your current medications as directed. Please refer to the Current Medication list given to you today.  A chest x-ray takes a picture of the organs and structures inside the chest, including the heart, lungs, and blood vessels. This test can show several things, including, whether the heart is enlarges; whether fluid is building up in the lungs; and whether pacemaker / defibrillator leads are still in place.  Your physician recommends that you go to the lab today for a BMET  Your physician has requested that you regularly monitor and record your blood pressure readings at home. Please use the same machine at the same time of day to check your readings and record them from one week. Please call us at the end of the week with the results.

## 2013-12-04 ENCOUNTER — Encounter: Payer: Self-pay | Admitting: General Surgery

## 2013-12-12 ENCOUNTER — Telehealth: Payer: Self-pay | Admitting: Cardiology

## 2013-12-12 NOTE — Telephone Encounter (Signed)
Good BP readings continue current therapy

## 2013-12-12 NOTE — Telephone Encounter (Signed)
New problem   Pt's bp reading as following. Please advise pt is she need to keep recording her readings.  12/01/13     147/55 12/02/13      143/59 12/03/13      138/72 12/04/13      127/57 12/05/13       127/61 12/07/13      144/65

## 2013-12-13 NOTE — Telephone Encounter (Signed)
Pt is aware.  

## 2013-12-31 ENCOUNTER — Encounter (HOSPITAL_COMMUNITY): Payer: Medicare Other

## 2014-01-08 DIAGNOSIS — I1310 Hypertensive heart and chronic kidney disease without heart failure, with stage 1 through stage 4 chronic kidney disease, or unspecified chronic kidney disease: Secondary | ICD-10-CM | POA: Diagnosis not present

## 2014-01-08 DIAGNOSIS — I340 Nonrheumatic mitral (valve) insufficiency: Secondary | ICD-10-CM | POA: Diagnosis not present

## 2014-01-08 DIAGNOSIS — E1159 Type 2 diabetes mellitus with other circulatory complications: Secondary | ICD-10-CM | POA: Diagnosis not present

## 2014-01-08 DIAGNOSIS — E11319 Type 2 diabetes mellitus with unspecified diabetic retinopathy without macular edema: Secondary | ICD-10-CM | POA: Diagnosis not present

## 2014-01-08 DIAGNOSIS — N183 Chronic kidney disease, stage 3 (moderate): Secondary | ICD-10-CM | POA: Diagnosis not present

## 2014-01-08 DIAGNOSIS — J918 Pleural effusion in other conditions classified elsewhere: Secondary | ICD-10-CM | POA: Diagnosis not present

## 2014-01-08 DIAGNOSIS — E782 Mixed hyperlipidemia: Secondary | ICD-10-CM | POA: Diagnosis not present

## 2014-01-08 DIAGNOSIS — R300 Dysuria: Secondary | ICD-10-CM | POA: Diagnosis not present

## 2014-01-08 DIAGNOSIS — E1121 Type 2 diabetes mellitus with diabetic nephropathy: Secondary | ICD-10-CM | POA: Diagnosis not present

## 2014-01-08 DIAGNOSIS — Z23 Encounter for immunization: Secondary | ICD-10-CM | POA: Diagnosis not present

## 2014-01-08 DIAGNOSIS — F4321 Adjustment disorder with depressed mood: Secondary | ICD-10-CM | POA: Diagnosis not present

## 2014-01-11 ENCOUNTER — Ambulatory Visit (HOSPITAL_COMMUNITY): Payer: Medicare Other | Attending: Cardiology | Admitting: Cardiology

## 2014-01-11 DIAGNOSIS — I6529 Occlusion and stenosis of unspecified carotid artery: Secondary | ICD-10-CM | POA: Diagnosis not present

## 2014-01-11 DIAGNOSIS — J449 Chronic obstructive pulmonary disease, unspecified: Secondary | ICD-10-CM | POA: Diagnosis not present

## 2014-01-11 DIAGNOSIS — I10 Essential (primary) hypertension: Secondary | ICD-10-CM | POA: Insufficient documentation

## 2014-01-11 DIAGNOSIS — I6523 Occlusion and stenosis of bilateral carotid arteries: Secondary | ICD-10-CM | POA: Diagnosis not present

## 2014-01-11 DIAGNOSIS — E785 Hyperlipidemia, unspecified: Secondary | ICD-10-CM | POA: Insufficient documentation

## 2014-01-11 DIAGNOSIS — E119 Type 2 diabetes mellitus without complications: Secondary | ICD-10-CM | POA: Diagnosis not present

## 2014-01-11 NOTE — Progress Notes (Signed)
Carotid duplex performed 

## 2014-01-16 ENCOUNTER — Telehealth: Payer: Self-pay | Admitting: Cardiology

## 2014-01-16 NOTE — Telephone Encounter (Signed)
New message     Want carotid ultrasound results

## 2014-01-16 NOTE — Telephone Encounter (Signed)
Patient informed of carotid dopplers and the  need to repeat them in 6 months.

## 2014-01-23 ENCOUNTER — Encounter: Payer: Self-pay | Admitting: Internal Medicine

## 2014-01-23 ENCOUNTER — Ambulatory Visit (INDEPENDENT_AMBULATORY_CARE_PROVIDER_SITE_OTHER)
Admission: RE | Admit: 2014-01-23 | Discharge: 2014-01-23 | Disposition: A | Discharge status: Home / Self Care | Payer: Medicare Other | Source: Ambulatory Visit | Attending: Internal Medicine | Admitting: Internal Medicine

## 2014-01-23 ENCOUNTER — Other Ambulatory Visit (INDEPENDENT_AMBULATORY_CARE_PROVIDER_SITE_OTHER): Payer: Medicare Other

## 2014-01-23 ENCOUNTER — Other Ambulatory Visit: Payer: Self-pay | Admitting: Internal Medicine

## 2014-01-23 ENCOUNTER — Ambulatory Visit (INDEPENDENT_AMBULATORY_CARE_PROVIDER_SITE_OTHER): Payer: Medicare Other | Admitting: Internal Medicine

## 2014-01-23 ENCOUNTER — Other Ambulatory Visit: Payer: Medicare Other

## 2014-01-23 VITALS — BP 122/56 | HR 67 | Temp 97.6°F | Ht 65.0 in | Wt 143.0 lb

## 2014-01-23 DIAGNOSIS — I6529 Occlusion and stenosis of unspecified carotid artery: Secondary | ICD-10-CM | POA: Diagnosis not present

## 2014-01-23 DIAGNOSIS — R079 Chest pain, unspecified: Secondary | ICD-10-CM | POA: Diagnosis not present

## 2014-01-23 DIAGNOSIS — R059 Cough, unspecified: Secondary | ICD-10-CM

## 2014-01-23 DIAGNOSIS — R05 Cough: Secondary | ICD-10-CM

## 2014-01-23 LAB — CBC WITH DIFFERENTIAL/PLATELET
BASOS PCT: 0.1 % (ref 0.0–3.0)
Basophils Absolute: 0 10*3/uL (ref 0.0–0.1)
EOS PCT: 3.5 % (ref 0.0–5.0)
Eosinophils Absolute: 0.4 10*3/uL (ref 0.0–0.7)
HEMATOCRIT: 37.8 % (ref 36.0–46.0)
Hemoglobin: 12.4 g/dL (ref 12.0–15.0)
Lymphocytes Relative: 31 % (ref 12.0–46.0)
Lymphs Abs: 3.6 10*3/uL (ref 0.7–4.0)
MCHC: 32.8 g/dL (ref 30.0–36.0)
MCV: 85.7 fl (ref 78.0–100.0)
MONO ABS: 1 10*3/uL (ref 0.1–1.0)
Monocytes Relative: 8.8 % (ref 3.0–12.0)
NEUTROS ABS: 6.6 10*3/uL (ref 1.4–7.7)
Neutrophils Relative %: 56.6 % (ref 43.0–77.0)
Platelets: 305 10*3/uL (ref 150.0–400.0)
RBC: 4.42 Mil/uL (ref 3.87–5.11)
RDW: 14.4 % (ref 11.5–15.5)
WBC: 11.7 10*3/uL — ABNORMAL HIGH (ref 4.0–10.5)

## 2014-01-23 LAB — D-DIMER, QUANTITATIVE (NOT AT ARMC): D DIMER QUANT: 1.75 ug{FEU}/mL — AB (ref 0.00–0.48)

## 2014-01-23 MED ORDER — AZITHROMYCIN 250 MG PO TABS: ORAL_TABLET | ORAL | Status: DC

## 2014-01-23 NOTE — Progress Notes (Signed)
CC: Mauricia Area, Fransico Him  History of Present Illness: [name] is a 80 y.o. Female never smoker with cough.  She had prior history of Lt pleuritic chest pain with small pleural effusion which resolved.  Dr Halford Chessman eval 11/28/12  She has occasional cough with clear sputum.  She uses her symbicort 3 to 4 times per week, and this helps.  rec Proair two puffs up to four times per day as needed for cough, wheeze, or chest congestion   02/05/2013 acute  ov/[name] Needs re: chronic cough and wheeze on ACEi Chief Complaint  Patient presents with  . Acute Visit    Pt c/o cough x 4 wks- occ prod with minimal clear sputum.  She also c/o some wheezing. Using rescue inhaler twice per day.   Onset was insidious, symptoms progressive x months despite  rx  with abx, steroids x 3, symbicort, sob mostly with coughing.   >>d/c Lotrel   02/19/2013 Follow up  2 week follow up cough. Reports is not much better. Still having cough occasionally producing clear mucus w/ some wheezing and SABA use twice daily. Patient was seen last visit, and taken off her Lotrel. She was placed on Pepcid twice daily. Patient reports that cough is slightly improved, however, continues to have a intermittent cough is mainly clear to white mucus. She is using Tussin. for her cough, mainly. She was taken off of Lotrel and changed over to exforge.  rec Remain off ACE inhibitor-Benazapril .  Stop Exforge.  Begin Amlodipine 64m daily .  Coninue to use Tussin as directed for cough  May use sips of water or sugarless candy to soothe throat.    01/23/2014  Acute  ov/[name] re:  Chief Complaint  Patient presents with  . Acute Visit    Pt c/o rt side CP for the past 3 days. She states that it's more painful when she takes a deep breath.  Had low grade fever at onset.   no cough at all before or after onset of pain Some sweats/ some fever x one episode sev nights prior to OV  Now resolved Pain with use of R arm also, no better  with tylenol, has ultram but did not try it yet. No recent travel or leg swelling/ calf pain  Onset was woke up one morning with it, thinks may have been from overdoing it in the yard the day before.  Located R ant chest and around to mid ax line on R    Current Medications, Allergies, Complete Past Medical History, Past Surgical History, Family History, and Social History were reviewed in [employer] [facility].  ROS  The following are not active complaints unless bolded sore throat, dysphagia, dental problems, itching, sneezing,  nasal congestion or excess/ purulent secretions, ear ache,   fever, chills, sweats, unintended wt loss,  exertional cp, hemoptysis,  orthopnea pnd or leg swelling, presyncope, palpitations, heartburn, abdominal pain, anorexia, nausea, vomiting, diarrhea  or change in bowel or urinary habits, change in stools or urine, dysuria,hematuria,  rash, arthralgias, visual complaints, headache, numbness weakness or ataxia or problems with walking or coordination,  change in mood/affect or memory.         Tests: CT chest 09/14/11>>small left pleural effusion ANA 10/18/11>>1:640 with speckled pattern V/Q scan 10/28/11>>low probability ANA 01/14/12>>1:160 with speckled pattern, ESR 51 Spirometry 06/22/12 >> FEV1 1.37 (73%), FEV1% 73  [place]  has a past medical history of Diabetes mellitus; Hypertension; Hypercholesterolemia; Osteoarthritis; Breast  cancer; CKD (chronic kidney disease), stage III; Carotid artery stenosis; Mild mitral regurgitation; Pernicious anemia; and Pleural effusion.  [name]  has past surgical history that includes Mastectomy; right knee replacement; right shoulder surgery; and Tonsillectomy.    Physical Exam: GEN: A/Ox3; pleasant ,   elderly  amb wf nad   HEENT:  [place]/AT,  EACs-clear, TMs-wnl, NOSE-clear, THROAT-clear, no lesions, no postnasal drip or exudate noted.   NECK:  Supple w/ fair ROM; no JVD; normal carotid  impulses w/o bruits; no thyromegaly or nodules palpated; no lymphadenopathy.  RESP  Clear to A and P, nowheezes/ rales/ or rhonchi.no accessory muscle use, no dullness to percussion  CARD:  RRR, no m/r/g  , no peripheral edema, pulses intact, no cyanosis or clubbing.  GI:   Soft & nt; nml bowel sounds; no organomegaly or masses detected.  Musco: Warm bil, no deformities or joint swelling noted.   Neuro: alert, no focal deficits noted.    Skin: Warm, no lesions or rashes     CXR  01/23/2014 :  The cardiac silhouette, mediastinal and hilar contours are within normal limits and stable. There is tortuosity and calcification of the thoracic aorta. The lungs are clear of acute process. Chronic scarring changes. No pleural effusion. Stable surgical changes involving the right breast. The bony thorax is intact.   Lab Results  Component Value Date   WBC 11.7* 01/23/2014   HGB 12.4 01/23/2014   HCT 37.8 01/23/2014   MCV 85.7 01/23/2014   PLT 305.0 01/23/2014     Lab Results  Component Value Date   DDIMER 1.75* 01/23/2014      Chemistry      Component Value Date/Time   NA 136 11/30/2013 1053   K 4.8 11/30/2013 1053   CL 103 11/30/2013 1053   CO2 27 11/30/2013 1053   BUN 28* 11/30/2013 1053   CREATININE 1.2 11/30/2013 1053      Component Value Date/Time   CALCIUM 9.5 11/30/2013 1053   ALKPHOS 80 10/18/2011 1639   AST 18 10/18/2011 1639   ALT 14 10/18/2011 1639   BILITOT 1.0 10/18/2011 1639

## 2014-01-23 NOTE — Progress Notes (Signed)
Quick Note:  Spoke with pt and notified of results per Dr. Wert. Pt verbalized understanding and denied any questions.  ______ 

## 2014-01-23 NOTE — Assessment & Plan Note (Signed)
Try off acei starting 02/05/2013 > resolved off acei

## 2014-01-23 NOTE — Patient Instructions (Addendum)
For pain, tramadol 50 mg up to every 4 hours as needed   Please remember to go to the lab and x-ray department downstairs for your tests - we will call you with the results when they are available.

## 2014-01-23 NOTE — Assessment & Plan Note (Addendum)
She has slt leukocytosis with transient fever, sweats so could have a bronchopna we're just not seeing but doubt it so rx Zpak to cover and check d dimer to be complete and post probability is low  Late add:  D dimer mildly elevated but so is so will need V/Q ordered for 01/24/14

## 2014-01-24 ENCOUNTER — Other Ambulatory Visit: Payer: Self-pay | Admitting: Internal Medicine

## 2014-01-24 ENCOUNTER — Ambulatory Visit (HOSPITAL_COMMUNITY)
Admission: RE | Admit: 2014-01-24 | Discharge: 2014-01-24 | Disposition: A | Discharge status: Home / Self Care | Payer: Medicare Other | Source: Ambulatory Visit | Attending: Radiology | Admitting: Radiology

## 2014-01-24 ENCOUNTER — Encounter (HOSPITAL_COMMUNITY): Payer: Self-pay

## 2014-01-24 ENCOUNTER — Telehealth: Payer: Self-pay | Admitting: Cardiology

## 2014-01-24 DIAGNOSIS — H43812 Vitreous degeneration, left eye: Secondary | ICD-10-CM | POA: Diagnosis not present

## 2014-01-24 DIAGNOSIS — R071 Chest pain on breathing: Secondary | ICD-10-CM | POA: Diagnosis not present

## 2014-01-24 DIAGNOSIS — E11359 Type 2 diabetes mellitus with proliferative diabetic retinopathy without macular edema: Secondary | ICD-10-CM | POA: Diagnosis not present

## 2014-01-24 DIAGNOSIS — R0789 Other chest pain: Secondary | ICD-10-CM

## 2014-01-24 DIAGNOSIS — R079 Chest pain, unspecified: Secondary | ICD-10-CM | POA: Diagnosis not present

## 2014-01-24 DIAGNOSIS — H35362 Drusen (degenerative) of macula, left eye: Secondary | ICD-10-CM | POA: Diagnosis not present

## 2014-01-24 MED ORDER — TECHNETIUM TO 99M ALBUMIN AGGREGATED
5.0000 | Freq: Once | INTRAVENOUS | Status: AC | PRN
  Administered 2014-01-24: 5 via INTRAVENOUS

## 2014-01-24 MED ORDER — TECHNETIUM TC 99M DIETHYLENETRIAME-PENTAACETIC ACID: 46.1000 | Freq: Once | INTRAVENOUS | Status: AC | PRN

## 2014-01-24 NOTE — Progress Notes (Signed)
Quick Note:  LMTCB ______ 

## 2014-01-24 NOTE — Telephone Encounter (Signed)
Informed patient that carotid dopplers showed at least 40-59% RICA stenosis and 38-37% LICA stenosis.  Let her know that dopplers will be repeated in in 6 months.

## 2014-01-24 NOTE — Progress Notes (Signed)
Quick Note:  Spoke with pt and notified of results per Dr. Melvyn Novas. Pt verbalized understanding and denied any questions. Dopplers ordered ______

## 2014-01-24 NOTE — Telephone Encounter (Signed)
New message ° ° ° ° ° °Returning a nurses call °

## 2014-01-24 NOTE — Progress Notes (Signed)
Quick Note:  ATC, Line busy ______

## 2014-01-24 NOTE — Progress Notes (Signed)
Quick Note:  LMTCB for the pt and her daughter Jan ______

## 2014-01-24 NOTE — Addendum Note (Signed)
Addended by: Mathis Bud on: 01/24/2014 11:25 AM   Modules accepted: Orders

## 2014-01-24 NOTE — Progress Notes (Signed)
Quick Note:  Pt aware ______ 

## 2014-01-25 ENCOUNTER — Ambulatory Visit (HOSPITAL_COMMUNITY): Payer: Medicare Other | Attending: Cardiology | Admitting: Cardiology

## 2014-01-25 DIAGNOSIS — M25569 Pain in unspecified knee: Secondary | ICD-10-CM

## 2014-01-25 DIAGNOSIS — R079 Chest pain, unspecified: Secondary | ICD-10-CM | POA: Insufficient documentation

## 2014-01-25 DIAGNOSIS — J449 Chronic obstructive pulmonary disease, unspecified: Secondary | ICD-10-CM | POA: Diagnosis not present

## 2014-01-25 DIAGNOSIS — E119 Type 2 diabetes mellitus without complications: Secondary | ICD-10-CM | POA: Insufficient documentation

## 2014-01-25 DIAGNOSIS — I10 Essential (primary) hypertension: Secondary | ICD-10-CM | POA: Insufficient documentation

## 2014-01-25 DIAGNOSIS — R0789 Other chest pain: Secondary | ICD-10-CM

## 2014-01-25 DIAGNOSIS — M25562 Pain in left knee: Secondary | ICD-10-CM

## 2014-01-25 DIAGNOSIS — E785 Hyperlipidemia, unspecified: Secondary | ICD-10-CM | POA: Insufficient documentation

## 2014-01-25 NOTE — Progress Notes (Signed)
Bilateral lower extremity venous duplex performed 

## 2014-01-29 ENCOUNTER — Telehealth: Payer: Self-pay | Admitting: Internal Medicine

## 2014-01-29 NOTE — Telephone Encounter (Signed)
Called pt. She has already spoken with Magda Paganini. nothing further needed

## 2014-01-29 NOTE — Progress Notes (Signed)
Quick Note:  Spoke with pt and notified of results per Dr. Wert. Pt verbalized understanding and denied any questions.  ______ 

## 2014-02-07 ENCOUNTER — Other Ambulatory Visit: Payer: Self-pay | Admitting: Cardiology

## 2014-02-28 DIAGNOSIS — Z961 Presence of intraocular lens: Secondary | ICD-10-CM | POA: Diagnosis not present

## 2014-02-28 DIAGNOSIS — H4011X2 Primary open-angle glaucoma, moderate stage: Secondary | ICD-10-CM | POA: Diagnosis not present

## 2014-02-28 DIAGNOSIS — H4011X0 Primary open-angle glaucoma, stage unspecified: Secondary | ICD-10-CM | POA: Diagnosis not present

## 2014-03-08 DIAGNOSIS — M1712 Unilateral primary osteoarthritis, left knee: Secondary | ICD-10-CM | POA: Diagnosis not present

## 2014-03-13 ENCOUNTER — Telehealth: Payer: Self-pay | Admitting: Cardiology

## 2014-03-13 NOTE — Telephone Encounter (Signed)
Received request from Nurse fax box, documents faxed for surgical clearance. To: [employer] Fax number: [phone] Attention: 12.16.15/KM

## 2014-03-18 DIAGNOSIS — N189 Chronic kidney disease, unspecified: Secondary | ICD-10-CM | POA: Diagnosis not present

## 2014-03-18 DIAGNOSIS — N2581 Secondary hyperparathyroidism of renal origin: Secondary | ICD-10-CM | POA: Diagnosis not present

## 2014-03-18 DIAGNOSIS — N183 Chronic kidney disease, stage 3 (moderate): Secondary | ICD-10-CM | POA: Diagnosis not present

## 2014-03-18 DIAGNOSIS — D631 Anemia in chronic kidney disease: Secondary | ICD-10-CM | POA: Diagnosis not present

## 2014-03-18 DIAGNOSIS — I129 Hypertensive chronic kidney disease with stage 1 through stage 4 chronic kidney disease, or unspecified chronic kidney disease: Secondary | ICD-10-CM | POA: Diagnosis not present

## 2014-03-26 ENCOUNTER — Encounter: Payer: Self-pay | Admitting: Cardiology

## 2014-04-10 ENCOUNTER — Ambulatory Visit (INDEPENDENT_AMBULATORY_CARE_PROVIDER_SITE_OTHER): Payer: Medicare Other | Admitting: Nurse Practitioner

## 2014-04-10 ENCOUNTER — Encounter: Payer: Self-pay | Admitting: Nurse Practitioner

## 2014-04-10 VITALS — BP 160/60 | HR 66 | Ht 65.0 in | Wt 145.4 lb

## 2014-04-10 DIAGNOSIS — I10 Essential (primary) hypertension: Secondary | ICD-10-CM | POA: Diagnosis not present

## 2014-04-10 DIAGNOSIS — I340 Nonrheumatic mitral (valve) insufficiency: Secondary | ICD-10-CM | POA: Diagnosis not present

## 2014-04-10 DIAGNOSIS — I129 Hypertensive chronic kidney disease with stage 1 through stage 4 chronic kidney disease, or unspecified chronic kidney disease: Secondary | ICD-10-CM | POA: Diagnosis not present

## 2014-04-10 DIAGNOSIS — Z01818 Encounter for other preprocedural examination: Secondary | ICD-10-CM | POA: Diagnosis not present

## 2014-04-10 DIAGNOSIS — N183 Chronic kidney disease, stage 3 (moderate): Secondary | ICD-10-CM | POA: Diagnosis not present

## 2014-04-10 NOTE — Patient Instructions (Signed)
We will arrange for a Lexiscan  Monitor your blood pressure at home  I think you are at least at moderate risk to have surgery - the stress test will help tell us more.  I will send a note to Dr. Truddie Coco  Call the [place] office at [phone] if you have any questions, problems or concerns.

## 2014-04-10 NOTE — Progress Notes (Addendum)
[name] Date of Birth: 05/09/1926 Medical Record #[id number]  History of Present Illness: [name] is seen back today for a pre op visit. Seen for [name]. She is an 81 y.o. female with a history of mild AS, mild MR, IDDM, carotid disease, CKD, COPD and HTN. No known CAD noted in the record. Last echo from 2014 with normal EF and grade 2 diastolic dysfunction. No stress noted in EPIC.   She was last seen here back in September by [name]. Felt to be doing well.  Comes in today. Here alone. She is sad. Tearful. Husband of 34 years passed away in 2023/02/15. She is entertaining the possibility of TKR - set for later in April. Had her right knee done 10 years ago. Limited mobility but able to walk her dog down her driveway twice a day. Very light housekeeping done. No chest pain. Chronic DOE but stable -  Seeing [name] tomorrow for discussion. No dizziness or lightheadedness. No syncope. Tolerating her medicines. BP earlier today at [name]'s was 283 systolic. She has had her medicines today.    Current Outpatient Prescriptions  Medication Sig Dispense Refill  . ALPRAZolam (XANAX) 0.25 MG tablet Take 1 tablet by mouth 2 (two) times daily as needed.    Marland Kitchen amLODipine (NORVASC) 10 MG tablet Take 10 mg by mouth daily.    Marland Kitchen aspirin 81 MG tablet Take 81 mg by mouth daily.    . calcitRIOL (ROCALTROL) 0.25 MCG capsule Take 0.25 mcg by mouth every other day.     . furosemide (LASIX) 40 MG tablet Take 40 mg by mouth daily.     Marland Kitchen gabapentin (NEURONTIN) 100 MG capsule Take 100 mg by mouth 3 (three) times daily.    . insulin NPH (NOVOLIN N) 100 UNIT/ML injection Inject 30 Units into the skin daily.    Marland Kitchen latanoprost (XALATAN) 0.005 % ophthalmic solution Place 1 drop into both eyes 2 (two) times daily.     . Omega-3 Fatty Acids (FISH OIL) 1000 MG CAPS Take 1 capsule by mouth 2 (two) times daily.    . traMADol (ULTRAM) 50 MG tablet Take 1 tablet by mouth 2 (two) times daily as needed.    .  valsartan (DIOVAN) 320 MG tablet TAKE 1 TABLET BY MOUTH ONCE DAILY 30 tablet 5   No current facility-administered medications for this visit.    Allergies  Allergen Reactions  . Atorvastatin Other (See Comments)    LEG CRAMPS  . Bisphosphonates     Contraindicated due to renal disease  . Ceftin [Cefuroxime]     RASH  . Penicillins     rash  . Shellfish Allergy     hives  . Simvastatin Other (See Comments)    LEG CRAMPS  . Zetia [Ezetimibe]     hives    Past Medical History  Diagnosis Date  . Osteoarthritis   . Mild mitral regurgitation   . Pernicious anemia   . Pleural effusion   . Diabetes mellitus     type 2 w complications of proteinuria and retinopathy  . Hypercholesterolemia   . Breast cancer 1991    right  . CKD (chronic kidney disease), stage III     from [name]  . Carotid artery stenosis 08/2011    nonobstructive 50-70% by doppler  . History of echocardiogram 02/2010    mild to mod AS, mildly thickened MV with MAC and Mild MR   . RLS (restless legs syndrome)     /  insomnia-hydroxizine works well..  . COPD (chronic obstructive pulmonary disease) 02/2011    cxr  . Pleuritis     Dr Halford [name]  . Hypertension     Past Surgical History  Procedure Laterality Date  . Mastectomy      right  . Right knee replacement    . Right shoulder surgery    . Tonsillectomy      History  Smoking status  . Never Smoker   Smokeless tobacco  . Never Used    History  Alcohol Use No    Family History  Problem Relation Age of Onset  . Emphysema Father   . Lymphoma Father     Review of Systems: The review of systems is per the HPI.  All other systems were reviewed and are negative.  Physical Exam: BP 160/60 mmHg  Pulse 66  Ht 5\' 5"  (1.651 m)  Wt 145 lb 6.4 oz (65.953 kg)  BMI 24.20 kg/m2 Patient is very pleasant elderly female who is in no acute distress. Using a cane. Skin is warm and dry. Color is normal.  HEENT is unremarkable.  Normocephalic/atraumatic. PERRL. Sclera are nonicteric. Neck is supple. No masses. No JVD. Lungs are clear. Cardiac exam shows a regular rate and rhythm. Abdomen is soft. Extremities are without edema. Gait and ROM are intact. No gross neurologic deficits noted.  Wt Readings from Last 3 Encounters:  04/10/14 145 lb 6.4 oz (65.953 kg)  01/23/14 143 lb (64.864 kg)  11/30/13 144 lb (65.318 kg)    LABORATORY DATA/PROCEDURES:  EKG today shows NSR with diffuse nonspecific ST/T wave changes - unchanged.  Lab Results  Component Value Date   WBC 11.7* 01/23/2014   HGB 12.4 01/23/2014   HCT 37.8 01/23/2014   PLT 305.0 01/23/2014   GLUCOSE 109* 11/30/2013   ALT 14 10/18/2011   AST 18 10/18/2011   NA 136 11/30/2013   K 4.8 11/30/2013   CL 103 11/30/2013   CREATININE 1.2 11/30/2013   BUN 28* 11/30/2013   CO2 27 11/30/2013   HGBA1C * 04/23/2010    7.2 (NOTE)                                                                       According to the ADA Clinical Practice Recommendations for 2011, when HbA1c is used as a screening test:   >=6.5%   Diagnostic of Diabetes Mellitus           (if abnormal result  is confirmed)  5.7-6.4%   Increased risk of developing Diabetes Mellitus  References:Diagnosis and Classification of Diabetes Mellitus,Diabetes KAJG,8115,72(IOMBT 1):S62-S69 and Standards of Medical Care in         Diabetes - 2011,Diabetes Care,2011,34  (Suppl 1):S11-S61.    BNP (last 3 results) No results for input(s): PROBNP in the last 8760 hours.  Echo Study Conclusions from 02/2013  - Left ventricle: The cavity size was normal. There was moderate focal basal and mild concentric hypertrophy. Systolic function was normal. The estimated ejection fraction was 55%. Wall motion was normal; there were no regional wall motion abnormalities. There was an increased relative contribution of atrial contraction to ventricular filling. Features are consistent with a pseudonormal  left ventricular filling pattern, with concomitant abnormal relaxation  and increased filling pressure (grade 2 diastolic dysfunction). - Aortic valve: There was mild stenosis. - Mitral valve: Mild regurgitation. - Left atrium: The atrium was moderately dilated.    Assessment / Plan: 1. Pre op clearance for TKR - given her multiple medical issues and advanced age - I feel she is at least at moderate risk for complications. Resting EKG abnormal. Multiple CV risk factors as well. Will get a Lexiscan to further risk stratify. She is undecided about whether she will proceed. Will be available if problems arise.   2. HTN -  BP up at our visit - she will monitor at home. For now no change in therapy.  3. Advanced age  7. COPD -  Seeing [name] tomorrow for discussion as well.   5. CKD -  Followed by renal.   Lexiscan to be ordered. Further disposition to follow.  Patient is agreeable to this plan and will call if any problems develop in the interim.   Burtis Junes, RN, [facility] [address] [place], [place]  [phone]

## 2014-04-15 ENCOUNTER — Ambulatory Visit (INDEPENDENT_AMBULATORY_CARE_PROVIDER_SITE_OTHER): Payer: Medicare Other | Admitting: Pulmonary Disease

## 2014-04-15 ENCOUNTER — Encounter: Payer: Self-pay | Admitting: Pulmonary Disease

## 2014-04-15 VITALS — BP 184/68 | HR 73 | Ht 65.0 in | Wt 144.8 lb

## 2014-04-15 DIAGNOSIS — R059 Cough, unspecified: Secondary | ICD-10-CM

## 2014-04-15 DIAGNOSIS — Z01811 Encounter for preprocedural respiratory examination: Secondary | ICD-10-CM | POA: Diagnosis not present

## 2014-04-15 DIAGNOSIS — R05 Cough: Secondary | ICD-10-CM

## 2014-04-15 NOTE — Patient Instructions (Signed)
Follow up with pulmonary medicine as needed 

## 2014-04-15 NOTE — Progress Notes (Signed)
Chief Complaint  Patient presents with  . Follow-up    Pt needs surgical clearance for Left knee replacement. Pt is scheduled for July 22, 2014. Denies any breathing issues.      History of Present Illness: [name] is a 81 y.o. female with cough likely from asthma.  She had prior history of Lt pleuritic chest pain with small pleural effusion which has resolved.  She is being evaluated by Dr. Maureen Ralphs for knee replacement.  She was advised to have pulmonary assessment for reported history of COPD.  This was based on CXR finding in 2012.  In previous pulmonary assessment her cough was felt to be related to asthma.  She currently denies cough, wheeze, sputum, or chest pain.  Her main limitation to activity is related to knee pain.  She does not feel her breathing limits her activity.  She denies skin rash or leg swelling.  She is not currently on inhaler therapy, and does not feel like she needs any therapy.  Tests: CT chest 09/14/11>>small left pleural effusion ANA 10/18/11>>1:640 with speckled pattern V/Q scan 10/28/11>>low probability ANA 01/14/12>>1:160 with speckled pattern, ESR 51 Spirometry 06/22/12 >> FEV1 1.37 (73%), FEV1% 73 Echo 03/09/13 >> mild LVH, EF 29%, grade 2 diastolic dysfx, mild AS, mild MR, mod LA dilation  PMHx >> Osteoarthritis, mitral regurgitation, HTN, pernicious anemia, DM, HLD, Breast cancer in 1991, CKD stage III, RLS  PSHx, Medications, Allergies, Fhx, Shx reviewed.   Physical Exam: Blood pressure 184/68, pulse 73, height 5' 5"  (1.651 m), weight 144 lb 12.8 oz (65.681 kg), SpO2 95 %. Body mass index is 24.1 kg/(m^2).  General - no distress ENT - no sinus tenderness, no oral exudate, no LAN Cardiac - s1s2 regular, no murmur Chest - no wheeze/rales Back - no tenderness Abd - soft, non-tender Ext - no edema Neuro - normal strength  Skin - no rashes Psych - normal mood, and behavior  CXR 01/23/14 >> no acute findings V/Q scan 01/24/14 >> low  probability for PE  Assessment/Plan:  Cough.  This was likely related to asthma.  She does not have evidence for COPD and I have removed this from her medical record.  She is not having any symptoms. Plan: - monitor off inhaler therapy  Pre-op respiratory evaluation for total knee replacement. She does not have any pulmonary disorders that would raise concern about her fitness for having surgery.  Her main issue relates to cardiac disease, and she is being seen by cardiology. Plan: - based on pulmonary assessment she can proceed with surgery   Chesley Mires, MD [facility] Pager:  [phone] 04/15/2014, 2:37 PM

## 2014-04-16 ENCOUNTER — Encounter (HOSPITAL_COMMUNITY): Payer: Medicare Other

## 2014-04-16 ENCOUNTER — Ambulatory Visit (HOSPITAL_COMMUNITY): Payer: Medicare Other | Attending: Cardiology | Admitting: Radiology

## 2014-04-16 DIAGNOSIS — E119 Type 2 diabetes mellitus without complications: Secondary | ICD-10-CM | POA: Diagnosis not present

## 2014-04-16 DIAGNOSIS — I10 Essential (primary) hypertension: Secondary | ICD-10-CM | POA: Diagnosis not present

## 2014-04-16 DIAGNOSIS — I340 Nonrheumatic mitral (valve) insufficiency: Secondary | ICD-10-CM

## 2014-04-16 DIAGNOSIS — Z794 Long term (current) use of insulin: Secondary | ICD-10-CM | POA: Insufficient documentation

## 2014-04-16 DIAGNOSIS — J449 Chronic obstructive pulmonary disease, unspecified: Secondary | ICD-10-CM | POA: Diagnosis not present

## 2014-04-16 DIAGNOSIS — Z01818 Encounter for other preprocedural examination: Secondary | ICD-10-CM

## 2014-04-16 DIAGNOSIS — R0600 Dyspnea, unspecified: Secondary | ICD-10-CM | POA: Diagnosis not present

## 2014-04-16 MED ORDER — REGADENOSON 0.4 MG/5ML IV SOLN
0.4000 mg | Freq: Once | INTRAVENOUS | Status: AC
  Administered 2014-04-16: 0.4 mg via INTRAVENOUS

## 2014-04-16 MED ORDER — AMINOPHYLLINE 25 MG/ML IV SOLN
75.0000 mg | Freq: Once | INTRAVENOUS | Status: AC
  Administered 2014-04-16: 75 mg via INTRAVENOUS

## 2014-04-16 MED ORDER — TECHNETIUM TC 99M SESTAMIBI GENERIC - CARDIOLITE
30.0000 | Freq: Once | INTRAVENOUS | Status: AC | PRN
  Administered 2014-04-16: 30 via INTRAVENOUS

## 2014-04-16 MED ORDER — TECHNETIUM TC 99M SESTAMIBI GENERIC - CARDIOLITE
10.0000 | Freq: Once | INTRAVENOUS | Status: AC | PRN
  Administered 2014-04-16: 10 via INTRAVENOUS

## 2014-04-16 NOTE — Progress Notes (Signed)
[name], [name] [phone]    Cardiology Nuclear Med Study  [name] is a 81 y.o. female     MRN : [id number]     DOB: 04/13/1926  Procedure Date: 04/16/2014  Nuclear Med Background Indication for Stress Test:  Evaluation for Ischemia, and Pending Surgical Clearance for  (L) TKR by Gaynelle Arabian, MD History:  COPD and No known CAD Cardiac Risk Factors: Carotid Disease, Hypertension and IDDM   Symptoms:  None indicated   Nuclear Pre-Procedure Caffeine/Decaff Intake:  None> 12 hrs NPO After: 9:00pm   Lungs:  clear O2 Sat: 92% on room air. IV 0.9% NS with Angio Cath:  22g  IV Site: L Antecubital x 1, tolerated well IV Started by:  Irven Baltimore, RN  Chest Size (in):  38 Cup Size: B  Height: 5\' 5"  (1.651 m)  Weight:  142 lb (64.411 kg)  BMI:  Body mass index is 23.63 kg/(m^2). Tech Comments:  Diovan this am. Fasting CBG was 143 at 0600 today. Last Insulin was 24 hrs ago. Irven Baltimore, RN.    Nuclear Med Study 1 or 2 day study: 1 day  Stress Test Type:  Lexiscan  Reading MD: N/A  Order Authorizing Provider:  Fransico Him, MD, and Truitt Merle, NP  Resting Radionuclide: Technetium 77m Sestamibi  Resting Radionuclide Dose: 11.0 mCi   Stress Radionuclide:  Technetium 13m Sestamibi  Stress Radionuclide Dose: 33.0 mCi           Stress Protocol Rest HR: 70 Stress HR: 74  Rest BP: 191/55 Stress BP: 83/35  Exercise Time (min): n/a METS: n/a   Predicted Max HR: 135 bpm % Max HR: 54.81 bpm Rate Pressure Product: 9324   Dose of Adenosine (mg):  n/a Dose of Lexiscan: 0.4 mg  Dose of Atropine (mg): n/a Dose of Dobutamine: n/a mcg/kg/min (at max HR)  Stress Test Technologist: Glade Lloyd, BS-ES  Nuclear Technologist:  Earl Many, CNMT     Rest Procedure:  Myocardial perfusion imaging was performed at rest 45 minutes following the intravenous administration of Technetium 14m Sestamibi. Rest ECG: NSR,  PVC, Inferior lateral ST depression.  Stress Procedure:  The patient received IV Lexiscan 0.4 mg over 15-seconds.  Technetium 46m Sestamibi injected at 30-seconds.  Quantitative spect images were obtained after a 45 minute delay.  During the infusion of Lexiscan the patient complained of leg weakness, SOB , lightheadedness and nausea.  Administered 75 mg aminophylline for lightheadedness, nausea and BP dropped significantly to 83/35 so raised legs.   Within two minutes of giving aminophylline the patient began to feel much better and her BP back up to 151/49.  Stress ECG: No significant change from baseline ECG  QPS Raw Data Images:  Acquisition technically good; normal left ventricular size. Stress Images:  Normal homogeneous uptake in all areas of the myocardium. Rest Images:  Normal homogeneous uptake in all areas of the myocardium. Subtraction (SDS):  No evidence of ischemia. Transient Ischemic Dilatation (Normal <1.22):  1.16 Lung/Heart Ratio (Normal <0.45):  0.33  Quantitative Gated Spect Images QGS EDV:  82 ml QGS ESV:  31 ml  Impression Exercise Capacity:  Lexiscan with no exercise. BP Response:  Normal blood pressure response. Clinical Symptoms:  There is dyspnea. ECG Impression:  No significant ST segment change suggestive of ischemia. Comparison with Prior Nuclear Study: No previous nuclear study performed  Overall Impression:  Normal stress nuclear study.  LV Ejection  Fraction: 62%.  LV Wall Motion:  NL LV Function; NL Wall Motion  Kirk Ruths

## 2014-04-24 DIAGNOSIS — I129 Hypertensive chronic kidney disease with stage 1 through stage 4 chronic kidney disease, or unspecified chronic kidney disease: Secondary | ICD-10-CM | POA: Diagnosis not present

## 2014-04-24 DIAGNOSIS — N183 Chronic kidney disease, stage 3 (moderate): Secondary | ICD-10-CM | POA: Diagnosis not present

## 2014-06-18 DIAGNOSIS — M1712 Unilateral primary osteoarthritis, left knee: Secondary | ICD-10-CM | POA: Diagnosis not present

## 2014-07-03 ENCOUNTER — Other Ambulatory Visit: Payer: Self-pay

## 2014-07-03 MED ORDER — VALSARTAN 320 MG PO TABS: 320.0000 mg | ORAL_TABLET | Freq: Every day | ORAL | Status: DC

## 2014-07-03 MED ORDER — VALSARTAN 320 MG PO TABS: 320.0000 mg | ORAL_TABLET | Freq: Every day | ORAL | Status: AC

## 2014-07-09 ENCOUNTER — Ambulatory Visit: Payer: Self-pay | Admitting: Orthopedic Surgery

## 2014-07-09 NOTE — Progress Notes (Signed)
Preoperative surgical orders have been place into the Epic hospital system for [name] on 07/09/2014, 5:56 PM  by Mickel Crow for surgery on 07-22-2014.  Preop Total Knee orders including Experal, IV Tylenol, and IV Decadron as long as there are no contraindications to the above medications. Arlee Muslim, PA-C

## 2014-07-09 NOTE — Progress Notes (Signed)
Please put orders in Epic surgery 07-22-14 pre op 07-15-14  Thanks

## 2014-07-11 DIAGNOSIS — E11319 Type 2 diabetes mellitus with unspecified diabetic retinopathy without macular edema: Secondary | ICD-10-CM | POA: Diagnosis not present

## 2014-07-11 DIAGNOSIS — N183 Chronic kidney disease, stage 3 (moderate): Secondary | ICD-10-CM | POA: Diagnosis not present

## 2014-07-11 DIAGNOSIS — M179 Osteoarthritis of knee, unspecified: Secondary | ICD-10-CM | POA: Diagnosis not present

## 2014-07-11 DIAGNOSIS — E1121 Type 2 diabetes mellitus with diabetic nephropathy: Secondary | ICD-10-CM | POA: Diagnosis not present

## 2014-07-11 DIAGNOSIS — E782 Mixed hyperlipidemia: Secondary | ICD-10-CM | POA: Diagnosis not present

## 2014-07-11 DIAGNOSIS — I739 Peripheral vascular disease, unspecified: Secondary | ICD-10-CM | POA: Diagnosis not present

## 2014-07-11 DIAGNOSIS — G4700 Insomnia, unspecified: Secondary | ICD-10-CM | POA: Diagnosis not present

## 2014-07-11 DIAGNOSIS — I1310 Hypertensive heart and chronic kidney disease without heart failure, with stage 1 through stage 4 chronic kidney disease, or unspecified chronic kidney disease: Secondary | ICD-10-CM | POA: Diagnosis not present

## 2014-07-11 DIAGNOSIS — I350 Nonrheumatic aortic (valve) stenosis: Secondary | ICD-10-CM | POA: Diagnosis not present

## 2014-07-11 DIAGNOSIS — E1159 Type 2 diabetes mellitus with other circulatory complications: Secondary | ICD-10-CM | POA: Diagnosis not present

## 2014-07-12 ENCOUNTER — Encounter: Payer: Self-pay | Admitting: Cardiology

## 2014-07-15 ENCOUNTER — Encounter (HOSPITAL_COMMUNITY): Payer: Self-pay

## 2014-07-15 ENCOUNTER — Encounter (HOSPITAL_COMMUNITY)
Admission: RE | Admit: 2014-07-15 | Discharge: 2014-07-15 | Disposition: A | Discharge status: Home / Self Care | Payer: Medicare Other | Source: Ambulatory Visit | Attending: Orthopedic Surgery | Admitting: Orthopedic Surgery

## 2014-07-15 DIAGNOSIS — M179 Osteoarthritis of knee, unspecified: Secondary | ICD-10-CM | POA: Diagnosis not present

## 2014-07-15 DIAGNOSIS — Z01818 Encounter for other preprocedural examination: Secondary | ICD-10-CM | POA: Insufficient documentation

## 2014-07-15 HISTORY — DX: Other complications of anesthesia, initial encounter: T88.59XA

## 2014-07-15 HISTORY — DX: Sleep disorder, unspecified: G47.9

## 2014-07-15 HISTORY — DX: Nausea with vomiting, unspecified: R11.2

## 2014-07-15 HISTORY — DX: Unspecified asthma, uncomplicated: J45.909

## 2014-07-15 HISTORY — DX: Polyneuropathy, unspecified: G62.9

## 2014-07-15 HISTORY — DX: Adverse effect of unspecified anesthetic, initial encounter: T41.45XA

## 2014-07-15 HISTORY — DX: Other specified postprocedural states: Z98.890

## 2014-07-15 LAB — URINALYSIS, ROUTINE W REFLEX MICROSCOPIC
BILIRUBIN URINE: NEGATIVE
Glucose, UA: NEGATIVE mg/dL
HGB URINE DIPSTICK: NEGATIVE
Ketones, ur: NEGATIVE mg/dL
Leukocytes, UA: NEGATIVE
Nitrite: NEGATIVE
Protein, ur: NEGATIVE mg/dL
SPECIFIC GRAVITY, URINE: 1.013 (ref 1.005–1.030)
Urobilinogen, UA: 0.2 mg/dL (ref 0.0–1.0)
pH: 5.5 (ref 5.0–8.0)

## 2014-07-15 LAB — SURGICAL PCR SCREEN
MRSA, PCR: NEGATIVE
STAPHYLOCOCCUS AUREUS: NEGATIVE

## 2014-07-15 LAB — CBC
HCT: 39.9 % (ref 36.0–46.0)
Hemoglobin: 12.8 g/dL (ref 12.0–15.0)
MCH: 28 pg (ref 26.0–34.0)
MCHC: 32.1 g/dL (ref 30.0–36.0)
MCV: 87.3 fL (ref 78.0–100.0)
PLATELETS: 282 10*3/uL (ref 150–400)
RBC: 4.57 MIL/uL (ref 3.87–5.11)
RDW: 14.1 % (ref 11.5–15.5)
WBC: 12.5 10*3/uL — AB (ref 4.0–10.5)

## 2014-07-15 LAB — PROTIME-INR
INR: 1.09 (ref 0.00–1.49)
Prothrombin Time: 14.3 seconds (ref 11.6–15.2)

## 2014-07-15 LAB — APTT: APTT: 37 s (ref 24–37)

## 2014-07-15 NOTE — Patient Instructions (Addendum)
YOUR PROCEDURE IS SCHEDULED ON : 07/22/14  REPORT TO [facility] MAIN ENTRANCE FOLLOW SIGNS TO SHORT STAY CENTER AT : 7:30 AM  CALL THIS NUMBER IF YOU HAVE PROBLEMS THE MORNING OF SURGERY [phone]  REMEMBER:  DO NOT EAT FOOD OR DRINK LIQUIDS AFTER MIDNIGHT  TAKE THESE MEDICINES THE MORNING OF SURGERY: GABAPENTIN / MAY TAKE XANAX IF NEEDED / TRAMADOL IF NEEDED FOR PAIN  YOU MAY NOT HAVE ANY METAL ON YOUR BODY INCLUDING HAIR PINS AND PIERCING'S. DO NOT WEAR JEWELRY, MAKEUP, LOTIONS, POWDERS OR PERFUMES. DO NOT WEAR NAIL POLISH. DO NOT SHAVE 48 HRS PRIOR TO SURGERY. MEN MAY SHAVE FACE AND NECK.  DO NOT [name]. [name] IS NOT RESPONSIBLE FOR VALUABLES.  CONTACTS, DENTURES OR PARTIALS MAY NOT BE WORN TO SURGERY. LEAVE SUITCASE IN CAR. CAN BE BROUGHT TO ROOM AFTER SURGERY.  PATIENTS DISCHARGED THE DAY OF SURGERY WILL NOT BE ALLOWED TO DRIVE HOME.  PLEASE READ OVER THE FOLLOWING INSTRUCTION SHEETS _________________________________________________________________________________                                                                                                                    [name] - PREPARING FOR SURGERY  Before surgery, you can play an important role.  Because skin is not sterile, your skin needs to be as free of germs as possible.  You can reduce the number of germs on your skin by washing with CHG (chlorahexidine gluconate) soap before surgery.  CHG is an antiseptic cleaner which kills germs and bonds with the skin to continue killing germs even after washing. Please DO NOT use if you have an allergy to CHG or antibacterial soaps.  If your skin becomes reddened/irritated stop using the CHG and inform your nurse when you arrive at Short Stay. Do not shave (including legs and underarms) for at least 48 hours prior to the first CHG shower.  You may shave your face. Please follow these  instructions carefully:   1.  Shower with CHG Soap the night before surgery and the  morning of Surgery.   2.  If you choose to wash your hair, wash your hair first as usual with your  normal  Shampoo.   3.  After you shampoo, rinse your hair and body thoroughly to remove the  shampoo.                                         4.  Use CHG as you would any other liquid soap.  You can apply chg directly  to the skin and wash . Gently wash with scrungie or clean wascloth    5.  Apply the CHG Soap to your body ONLY FROM THE NECK DOWN.   Do not use on open  Wound or open sores. Avoid contact with eyes, ears mouth and genitals (private parts).                        Genitals (private parts) with your normal soap.              6.  Wash thoroughly, paying special attention to the area where your surgery  will be performed.   7.  Thoroughly rinse your body with warm water from the neck down.   8.  DO NOT shower/wash with your normal soap after using and rinsing off  the CHG Soap .                9.  Pat yourself dry with a clean towel.             10.  Wear clean night clothes to bed after shower             11.  Place clean sheets on your bed the night of your first shower and do not  sleep with pets.  Day of Surgery : Do not apply any lotions/deodorants the morning of surgery.  Please wear clean clothes to the hospital/surgery center.  FAILURE TO FOLLOW THESE INSTRUCTIONS MAY RESULT IN THE CANCELLATION OF YOUR SURGERY    PATIENT SIGNATURE_________________________________  ______________________________________________________________________     [name]  An incentive spirometer is a tool that can help keep your lungs clear and active. This tool measures how well you are filling your lungs with each breath. Taking long deep breaths may help reverse or decrease the chance of developing breathing (pulmonary) problems (especially infection)  following:  A long period of time when you are unable to move or be active. BEFORE THE PROCEDURE   If the spirometer includes an indicator to show your best effort, your nurse or respiratory therapist will set it to a desired goal.  If possible, sit up straight or lean slightly forward. Try not to slouch.  Hold the incentive spirometer in an upright position. INSTRUCTIONS FOR USE   Sit on the edge of your bed if possible, or sit up as far as you can in bed or on a chair.  Hold the incentive spirometer in an upright position.  Breathe out normally.  Place the mouthpiece in your mouth and seal your lips tightly around it.  Breathe in slowly and as deeply as possible, raising the piston or the ball toward the top of the column.  Hold your breath for 3-5 seconds or for as long as possible. Allow the piston or ball to fall to the bottom of the column.  Remove the mouthpiece from your mouth and breathe out normally.  Rest for a few seconds and repeat Steps 1 through 7 at least 10 times every 1-2 hours when you are awake. Take your time and take a few normal breaths between deep breaths.  The spirometer may include an indicator to show your best effort. Use the indicator as a goal to work toward during each repetition.  After each set of 10 deep breaths, practice coughing to be sure your lungs are clear. If you have an incision (the cut made at the time of surgery), support your incision when coughing by placing a pillow or rolled up towels firmly against it. Once you are able to get out of bed, walk around indoors and cough well. You may stop using the incentive spirometer when instructed by your caregiver.  RISKS AND COMPLICATIONS  Take your time so you do not get dizzy or light-headed.  If you are in pain, you may need to take or ask for pain medication before doing incentive spirometry. It is harder to take a deep breath if you are having pain. AFTER USE  Rest and breathe slowly  and easily.  It can be helpful to keep track of a log of your progress. Your caregiver can provide you with a simple table to help with this. If you are using the spirometer at home, follow these instructions: [name] IF:   You are having difficultly using the spirometer.  You have trouble using the spirometer as often as instructed.  Your pain medication is not giving enough relief while using the spirometer.  You develop fever of 100.5 F (38.1 C) or higher. SEEK IMMEDIATE MEDICAL CARE IF:   You cough up bloody sputum that had not been present before.  You develop fever of 102 F (38.9 C) or greater.  You develop worsening pain at or near the incision site. MAKE SURE YOU:   Understand these instructions.  Will watch your condition.  Will get help right away if you are not doing well or get worse. Document Released: 07/26/2006 Document Revised: 06/07/2011 Document Reviewed: 09/26/2006 ExitCare Patient Information 2014 [employer], [place].   ________________________________________________________________________  WHAT IS A BLOOD TRANSFUSION? Blood Transfusion Information  A transfusion is the replacement of blood or some of its parts. Blood is made up of multiple cells which provide different functions.  Red blood cells carry oxygen and are used for blood loss replacement.  White blood cells fight against infection.  Platelets control bleeding.  Plasma helps clot blood.  Other blood products are available for specialized needs, such as hemophilia or other clotting disorders. BEFORE THE TRANSFUSION  Who gives blood for transfusions?   Healthy volunteers who are fully evaluated to make sure their blood is safe. This is blood bank blood. Transfusion therapy is the safest it has ever been in the practice of medicine. Before blood is taken from a donor, a complete history is taken to make sure that person has no history of diseases nor engages in risky social  behavior (examples are intravenous drug use or sexual activity with multiple partners). The donor's travel history is screened to minimize risk of transmitting infections, such as malaria. The donated blood is tested for signs of infectious diseases, such as HIV and hepatitis. The blood is then tested to be sure it is compatible with you in order to minimize the chance of a transfusion reaction. If you or a relative donates blood, this is often done in anticipation of surgery and is not appropriate for emergency situations. It takes many days to process the donated blood. RISKS AND COMPLICATIONS Although transfusion therapy is very safe and saves many lives, the main dangers of transfusion include:   Getting an infectious disease.  Developing a transfusion reaction. This is an allergic reaction to something in the blood you were given. Every precaution is taken to prevent this. The decision to have a blood transfusion has been considered carefully by your caregiver before blood is given. Blood is not given unless the benefits outweigh the risks. AFTER THE TRANSFUSION  Right after receiving a blood transfusion, you will usually feel much better and more energetic. This is especially true if your red blood cells have gotten low (anemic). The transfusion raises the level of the red blood cells which carry oxygen, and this  usually causes an energy increase.  The nurse administering the transfusion will monitor you carefully for complications. HOME CARE INSTRUCTIONS  No special instructions are needed after a transfusion. You may find your energy is better. Speak with your caregiver about any limitations on activity for underlying diseases you may have. SEEK MEDICAL CARE IF:   Your condition is not improving after your transfusion.  You develop redness or irritation at the intravenous (IV) site. SEEK IMMEDIATE MEDICAL CARE IF:  Any of the following symptoms occur over the next 12 hours:  Shaking  chills.  You have a temperature by mouth above 102 F (38.9 C), not controlled by medicine.  Chest, back, or muscle pain.  People around you feel you are not acting correctly or are confused.  Shortness of breath or difficulty breathing.  Dizziness and fainting.  You get a rash or develop hives.  You have a decrease in urine output.  Your urine turns a dark color or changes to pink, red, or brown. Any of the following symptoms occur over the next 10 days:  You have a temperature by mouth above 102 F (38.9 C), not controlled by medicine.  Shortness of breath.  Weakness after normal activity.  The white part of the eye turns yellow (jaundice).  You have a decrease in the amount of urine or are urinating less often.  Your urine turns a dark color or changes to pink, red, or brown. Document Released: 03/12/2000 Document Revised: 06/07/2011 Document Reviewed: 10/30/2007 [facility] Patient Information 2014 [place], [place].  _______________________________________________________________________

## 2014-07-15 NOTE — Progress Notes (Signed)
CMET DONE 07-11-14 ON CHART

## 2014-07-21 ENCOUNTER — Ambulatory Visit: Payer: Self-pay | Admitting: Orthopedic Surgery

## 2014-07-21 NOTE — H&P (Signed)
[name] DOB: 1927/01/13 Married / Language: English / Race: White Female Date of Admission:  07/22/2014 CC:  Left Knee Pain History of Present Illness  The patient is a 81 year old female who comes in for a preoperative History and Physical. The patient is scheduled for a left total knee arthroplasty to be performed by Dr. Dione Plover. Aluisio, MD at [facility] on 07-22-2014. The patient is a 81 year old female who presented for follow up of their knee. The patient is being followed for their left knee pain and osteoarthritis. They are many month(s) out from Synvisc Injections. Symptoms reported include: pain, pain at night and pain with weightbearing. The patient feels that they are doing poorly and report their pain level to be moderate. Current treatment includes: bracing and pain medications. The following medication has been used for pain control: Tylenol. The patient indicates that they have questions or concerns today regarding surgery. Unfortunately, the pain has gotten progressively worse. She is at a point where she is hurting all of the time. She is at a stage where she wants to go ahead and get this fixed surgically. Unfortunately, her husband, Rito Ehrlich, recently passed away. They were married for 60 years and this has already had a negative impact on her. She feels that if she goes ahead and does the surgery, that she will be able to get around better and take better care of herself, once she recovers. She has gotten advanced arthritis of the left knee with progressively worsening pain and dysfunction. Cortisone and Visco supplements did not help. At this point, the most predictable means of improving her pain and function will be total knee arthroplasty. We discussed the changes in this procedure today, compared to when she had it done on the other side, several years ago. She is encouraged by these changes. She is ready to proceed at this time. They have been treated  conservatively in the past for the above stated problem and despite conservative measures, they continue to have progressive pain and severe functional limitations and dysfunction. They have failed non-operative management including home exercise, medications, and injections. It is felt that they would benefit from undergoing total joint replacement. Risks and benefits of the procedure have been discussed with the patient and they elect to proceed with surgery. There are no active contraindications to surgery such as ongoing infection or rapidly progressive neurological disease.  Problem List/Past Medical  Mallet finger of left hand (M20.012) Achilles tendinitis (M76.60) Breast Cancer Right-sided Primary localized osteoarthritis of left knee (M17.12) Diabetes Mellitus, Type II High blood pressure Pneumonia Past History Diabetic Retinopathy Carotid Artery Stenosis Heart murmur Anxiety Disorder Cataract  Allergies Shellfish Denies any issues with topical Betadine Aspirin *ANALGESICS - NonNarcotic* in full doses but tolerates the baby aspirin  Family History Osteoarthritis mother Cancer father  Social History Drug/Alcohol Rehab (Previously) no Drug/Alcohol Rehab (Currently) no Number of flights of stairs before winded greater than 5 Marital status married Living situation live with spouse Tobacco use Never smoker. never smoker Children 4 Alcohol use never consumed alcohol Exercise Exercises rarely; does other Current work status retired Tobacco / smoke exposure no Pain Contract no Illicit drug use no Advance Directives Living Will, Healthcare POA  Medication History AmLODIPine Besylate (10MG  Tablet, Oral) Active. Aspirin EC (81MG  Tablet DR, Oral) Active. Calcitriol (0.25MCG Capsule, Oral) Active. Fish Oil Active. Gabapentin (100MG  Capsule, Oral) Active. Diovan (320MG  Tablet, Oral) Active. Furosemide (40MG  Tablet, Oral) Active. Xanax  (0.25MG  Tablet, Oral daily)  Active. TraMADol HCl (50MG  Tablet, Oral daily) Active.   Past Surgical History Rotator Cuff Repair right Tonsillectomy Date: 29. Total Knee Replacement Date: 2004. right Mastectomy Date: 24. right Arthroscopy of Shoulder right Cataract Extraction-Bilateral   Review of Systems General Not Present- Chills, Fatigue, Fever, Memory Loss, Night Sweats, Weight Gain and Weight Loss. Skin Not Present- Eczema, Hives, Itching, Lesions and Rash. HEENT Not Present- Dentures, Double Vision, Headache, Hearing Loss, Tinnitus and Visual Loss. Respiratory Not Present- Allergies, Chronic Cough, Coughing up blood, Shortness of breath at rest and Shortness of breath with exertion. Cardiovascular Not Present- Chest Pain, Difficulty Breathing Lying Down, Murmur, Palpitations, Racing/skipping heartbeats and Swelling. Gastrointestinal Not Present- Abdominal Pain, Bloody Stool, Constipation, Diarrhea, Difficulty Swallowing, Heartburn, Jaundice, Loss of appetitie, Nausea and Vomiting. Female Genitourinary Not Present- Blood in Urine, Discharge, Flank Pain, Incontinence, Painful Urination, Urgency, Urinary frequency, Urinary Retention, Urinating at Night and Weak urinary stream. Musculoskeletal Present- Joint Pain. Not Present- Back Pain, Joint Swelling, Morning Stiffness, Muscle Pain, Muscle Weakness and Spasms. Neurological Not Present- Blackout spells, Difficulty with balance, Dizziness, Paralysis, Tremor and Weakness. Psychiatric Not Present- Insomnia.  Vitals  Weight: 143.38 lb Height: 64in Body Surface Area: 1.7 m Body Mass Index: 24.61 kg/m  BP: 164/64 (Sitting, Left Arm, Standard)   Physical Exam  General Mental Status -Alert, cooperative and good historian. General Appearance-pleasant, Not in acute distress. Orientation-Oriented X3. Build & Nutrition-Well nourished and Well developed.  Head and Neck Head-normocephalic, atraumatic  . Neck Global Assessment - bruit auscultated on the right, bruit auscultated on the left and supple.  Eye Pupil - Bilateral-Regular and Round. Motion - Bilateral-EOMI.  Chest and Lung Exam Auscultation Breath sounds - clear at anterior chest wall and clear at posterior chest wall. Adventitious sounds - No Adventitious sounds.  Cardiovascular Auscultation Rhythm - Regular rate and rhythm. Heart Sounds - S1 WNL and S2 WNL. Murmurs & Other Heart Sounds: Murmur 1 - Location - Aortic Area, Left Carotid, Right Carotid and Sternal Border - Left. Timing - Holosystolic. Grade - III/VI. Character - Crescendo/Decrescendo.  Abdomen Palpation/Percussion Tenderness - Abdomen is non-tender to palpation. Rigidity (guarding) - Abdomen is soft. Auscultation Auscultation of the abdomen reveals - Bowel sounds normal.  Female Genitourinary Note: Not done, not pertinent to present illness   Musculoskeletal Note: On examination, she is alert and oriented in no apparent distress. Evaluation of her left knee shows trace effusion. She has marked crepitus on range of motion of the knee. Her range is about 5 to 120. She is tender medial greater than lateral with no instability noted. Pulses, sensation and motor are intact.  RADIOGRAPHS: We reviewed her radiographs from last year and she is bone-on-bone of the medial and patellofemoral compartments with lateral narrowing also. I saw no need to repeat the radiographs today, given the severe end-stage arthritis that was present then, and the fact that it is not going to be any better now.   Assessment & Plan Primary osteoarthritis of left knee (M17.12) Note:Surgical Plans: Left Total Knee Repalcement  Disposition: Home with Family  PCP: Dr. Mayra Neer - Patient has been seen preoperatively and felt to be stable for surgery. Cards: Dr. Radford Pax - Patient has been seen preoperatively and felt to be stable for surgery. Pulmonary: Dr.  Halford Chessman Reanl: Dr. Jimmy Footman  Topical TXA - Breast Cancer AVOID RIGHT ARM FOR IV'S/BP'S  Anesthesia Issues: Had postop nausea and vomiting in the past.  Signed electronically by Joelene Millin, III PA-C

## 2014-07-22 ENCOUNTER — Inpatient Hospital Stay (HOSPITAL_COMMUNITY): Payer: Medicare Other | Admitting: Registered Nurse

## 2014-07-22 ENCOUNTER — Encounter (HOSPITAL_COMMUNITY)
Admission: RE | Disposition: A | Discharge status: Home Health | Payer: Self-pay | Source: Ambulatory Visit | Attending: Orthopedic Surgery

## 2014-07-22 ENCOUNTER — Encounter (HOSPITAL_COMMUNITY): Payer: Self-pay | Admitting: Orthopedic Surgery

## 2014-07-22 ENCOUNTER — Inpatient Hospital Stay (HOSPITAL_COMMUNITY)
Admission: RE | Admit: 2014-07-22 | Discharge: 2014-07-25 | DRG: 470 | Disposition: A | Discharge status: Home Health | Payer: Medicare Other | Source: Ambulatory Visit | Attending: Orthopedic Surgery | Admitting: Orthopedic Surgery

## 2014-07-22 DIAGNOSIS — I129 Hypertensive chronic kidney disease with stage 1 through stage 4 chronic kidney disease, or unspecified chronic kidney disease: Secondary | ICD-10-CM | POA: Diagnosis present

## 2014-07-22 DIAGNOSIS — Z7982 Long term (current) use of aspirin: Secondary | ICD-10-CM

## 2014-07-22 DIAGNOSIS — M25562 Pain in left knee: Secondary | ICD-10-CM | POA: Diagnosis not present

## 2014-07-22 DIAGNOSIS — N183 Chronic kidney disease, stage 3 (moderate): Secondary | ICD-10-CM | POA: Diagnosis present

## 2014-07-22 DIAGNOSIS — Z79899 Other long term (current) drug therapy: Secondary | ICD-10-CM

## 2014-07-22 DIAGNOSIS — R112 Nausea with vomiting, unspecified: Secondary | ICD-10-CM | POA: Diagnosis not present

## 2014-07-22 DIAGNOSIS — I2510 Atherosclerotic heart disease of native coronary artery without angina pectoris: Secondary | ICD-10-CM | POA: Diagnosis not present

## 2014-07-22 DIAGNOSIS — E11319 Type 2 diabetes mellitus with unspecified diabetic retinopathy without macular edema: Secondary | ICD-10-CM | POA: Diagnosis present

## 2014-07-22 DIAGNOSIS — M179 Osteoarthritis of knee, unspecified: Secondary | ICD-10-CM | POA: Diagnosis not present

## 2014-07-22 DIAGNOSIS — Z853 Personal history of malignant neoplasm of breast: Secondary | ICD-10-CM | POA: Diagnosis not present

## 2014-07-22 DIAGNOSIS — M1712 Unilateral primary osteoarthritis, left knee: Principal | ICD-10-CM | POA: Diagnosis present

## 2014-07-22 DIAGNOSIS — F419 Anxiety disorder, unspecified: Secondary | ICD-10-CM | POA: Diagnosis present

## 2014-07-22 DIAGNOSIS — G2581 Restless legs syndrome: Secondary | ICD-10-CM | POA: Diagnosis present

## 2014-07-22 DIAGNOSIS — Z01812 Encounter for preprocedural laboratory examination: Secondary | ICD-10-CM | POA: Diagnosis not present

## 2014-07-22 DIAGNOSIS — M1710 Unilateral primary osteoarthritis, unspecified knee: Secondary | ICD-10-CM | POA: Diagnosis present

## 2014-07-22 HISTORY — PX: TOTAL KNEE ARTHROPLASTY: SHX125

## 2014-07-22 LAB — GLUCOSE, CAPILLARY
GLUCOSE-CAPILLARY: 138 mg/dL — AB (ref 70–99)
Glucose-Capillary: 136 mg/dL — ABNORMAL HIGH (ref 70–99)
Glucose-Capillary: 226 mg/dL — ABNORMAL HIGH (ref 70–99)

## 2014-07-22 LAB — TYPE AND SCREEN
ABO/RH(D): A NEG
Antibody Screen: POSITIVE
DAT, IGG: NEGATIVE

## 2014-07-22 SURGERY — ARTHROPLASTY, KNEE, TOTAL
Anesthesia: Spinal | Site: Knee | Laterality: Left

## 2014-07-22 MED ORDER — POLYETHYLENE GLYCOL 3350 17 G PO PACK
17.0000 g | PACK | Freq: Every day | ORAL | Status: DC | PRN
  Administered 2014-07-25: 17 g via ORAL
  Filled 2014-07-22: qty 1

## 2014-07-22 MED ORDER — FENTANYL CITRATE (PF) 100 MCG/2ML IJ SOLN
INTRAMUSCULAR | Status: AC
Start: 2014-07-22 — End: 2014-07-22
  Filled 2014-07-22: qty 2

## 2014-07-22 MED ORDER — DEXAMETHASONE SODIUM PHOSPHATE 10 MG/ML IJ SOLN
10.0000 mg | Freq: Once | INTRAMUSCULAR | Status: AC
  Administered 2014-07-22: 10 mg via INTRAVENOUS

## 2014-07-22 MED ORDER — ALPRAZOLAM 0.25 MG PO TABS
0.2500 mg | ORAL_TABLET | Freq: Two times a day (BID) | ORAL | Status: DC | PRN
  Administered 2014-07-23 – 2014-07-24 (×2): 0.25 mg via ORAL
  Filled 2014-07-22 (×2): qty 1

## 2014-07-22 MED ORDER — MIDAZOLAM HCL 2 MG/2ML IJ SOLN
INTRAMUSCULAR | Status: AC
  Filled 2014-07-22: qty 2

## 2014-07-22 MED ORDER — BUPIVACAINE LIPOSOME 1.3 % IJ SUSP
20.0000 mL | Freq: Once | INTRAMUSCULAR | Status: DC
  Filled 2014-07-22: qty 20

## 2014-07-22 MED ORDER — ACETAMINOPHEN 325 MG PO TABS
650.0000 mg | ORAL_TABLET | Freq: Four times a day (QID) | ORAL | Status: DC | PRN
  Administered 2014-07-23: 650 mg via ORAL
  Filled 2014-07-22: qty 2

## 2014-07-22 MED ORDER — SODIUM CHLORIDE 0.9 % IV SOLN
INTRAVENOUS | Status: DC
  Administered 2014-07-22: 75 mL/h via INTRAVENOUS
  Administered 2014-07-23: 05:00:00 via INTRAVENOUS

## 2014-07-22 MED ORDER — FENTANYL CITRATE (PF) 100 MCG/2ML IJ SOLN
INTRAMUSCULAR | Status: AC
  Filled 2014-07-22: qty 2

## 2014-07-22 MED ORDER — VANCOMYCIN HCL IN DEXTROSE 1-5 GM/200ML-% IV SOLN
1000.0000 mg | INTRAVENOUS | Status: AC
  Administered 2014-07-22: 1000 mg via INTRAVENOUS
  Filled 2014-07-22: qty 200

## 2014-07-22 MED ORDER — LATANOPROST 0.005 % OP SOLN
1.0000 [drp] | Freq: Every day | OPHTHALMIC | Status: DC
  Administered 2014-07-22 – 2014-07-24 (×3): 1 [drp] via OPHTHALMIC
  Filled 2014-07-22: qty 2.5

## 2014-07-22 MED ORDER — ACETAMINOPHEN 500 MG PO TABS
1000.0000 mg | ORAL_TABLET | Freq: Four times a day (QID) | ORAL | Status: AC
  Administered 2014-07-22 – 2014-07-23 (×3): 1000 mg via ORAL
  Filled 2014-07-22 (×3): qty 2

## 2014-07-22 MED ORDER — BUPIVACAINE LIPOSOME 1.3 % IJ SUSP
INTRAMUSCULAR | Status: DC | PRN
  Administered 2014-07-22: 20 mL

## 2014-07-22 MED ORDER — METOCLOPRAMIDE HCL 10 MG PO TABS: 5.0000 mg | ORAL_TABLET | Freq: Three times a day (TID) | ORAL | Status: DC | PRN

## 2014-07-22 MED ORDER — METHOCARBAMOL 500 MG PO TABS
500.0000 mg | ORAL_TABLET | Freq: Four times a day (QID) | ORAL | Status: DC | PRN
  Administered 2014-07-22 – 2014-07-25 (×6): 500 mg via ORAL
  Filled 2014-07-22 (×6): qty 1

## 2014-07-22 MED ORDER — METHOCARBAMOL 1000 MG/10ML IJ SOLN
500.0000 mg | Freq: Four times a day (QID) | INTRAVENOUS | Status: DC | PRN
  Administered 2014-07-22: 500 mg via INTRAVENOUS
  Filled 2014-07-22 (×2): qty 5

## 2014-07-22 MED ORDER — OXYCODONE HCL 5 MG PO TABS
5.0000 mg | ORAL_TABLET | ORAL | Status: DC | PRN
  Administered 2014-07-22 (×2): 5 mg via ORAL
  Administered 2014-07-22: 10 mg via ORAL
  Administered 2014-07-23 (×3): 5 mg via ORAL
  Administered 2014-07-24: 10 mg via ORAL
  Administered 2014-07-24 (×2): 5 mg via ORAL
  Filled 2014-07-22 (×2): qty 2
  Filled 2014-07-22 (×4): qty 1
  Filled 2014-07-22: qty 2
  Filled 2014-07-22: qty 1

## 2014-07-22 MED ORDER — PHENOL 1.4 % MT LIQD: 1.0000 | OROMUCOSAL | Status: DC | PRN

## 2014-07-22 MED ORDER — CHLORHEXIDINE GLUCONATE 4 % EX LIQD: 60.0000 mL | Freq: Once | CUTANEOUS | Status: DC

## 2014-07-22 MED ORDER — PROMETHAZINE HCL 25 MG/ML IJ SOLN: 6.2500 mg | INTRAMUSCULAR | Status: DC | PRN

## 2014-07-22 MED ORDER — MORPHINE SULFATE 2 MG/ML IJ SOLN
INTRAMUSCULAR | Status: AC
  Administered 2014-07-22: 1 mg via INTRAVENOUS
  Filled 2014-07-22: qty 1

## 2014-07-22 MED ORDER — AMLODIPINE BESYLATE 10 MG PO TABS
10.0000 mg | ORAL_TABLET | Freq: Every day | ORAL | Status: DC
  Administered 2014-07-22 – 2014-07-24 (×3): 10 mg via ORAL
  Filled 2014-07-22 (×4): qty 1

## 2014-07-22 MED ORDER — MIDAZOLAM HCL 5 MG/5ML IJ SOLN
INTRAMUSCULAR | Status: DC | PRN
  Administered 2014-07-22: 1 mg via INTRAVENOUS

## 2014-07-22 MED ORDER — TRANEXAMIC ACID 1000 MG/10ML IV SOLN
2000.0000 mg | INTRAVENOUS | Status: DC | PRN
  Administered 2014-07-22: 2000 mg via TOPICAL

## 2014-07-22 MED ORDER — BISACODYL 10 MG RE SUPP
10.0000 mg | Freq: Every day | RECTAL | Status: DC | PRN
Start: 2014-07-22 — End: 2014-07-25

## 2014-07-22 MED ORDER — ONDANSETRON HCL 4 MG/2ML IJ SOLN
4.0000 mg | Freq: Four times a day (QID) | INTRAMUSCULAR | Status: DC | PRN
  Administered 2014-07-22 – 2014-07-24 (×2): 4 mg via INTRAVENOUS
  Filled 2014-07-22 (×2): qty 2

## 2014-07-22 MED ORDER — SODIUM CHLORIDE 0.9 % IJ SOLN
INTRAMUSCULAR | Status: DC | PRN
  Administered 2014-07-22: 30 mL

## 2014-07-22 MED ORDER — ACETAMINOPHEN 10 MG/ML IV SOLN
1000.0000 mg | Freq: Once | INTRAVENOUS | Status: AC
  Administered 2014-07-22: 1000 mg via INTRAVENOUS
  Filled 2014-07-22: qty 100

## 2014-07-22 MED ORDER — DEXAMETHASONE SODIUM PHOSPHATE 10 MG/ML IJ SOLN
INTRAMUSCULAR | Status: AC
  Filled 2014-07-22: qty 1

## 2014-07-22 MED ORDER — ONDANSETRON HCL 4 MG/2ML IJ SOLN
INTRAMUSCULAR | Status: AC
  Filled 2014-07-22: qty 2

## 2014-07-22 MED ORDER — MORPHINE SULFATE 2 MG/ML IJ SOLN
1.0000 mg | INTRAMUSCULAR | Status: DC | PRN
Start: 2014-07-22 — End: 2014-07-25
  Administered 2014-07-22 (×2): 1 mg via INTRAVENOUS
  Filled 2014-07-22: qty 1

## 2014-07-22 MED ORDER — ONDANSETRON HCL 4 MG PO TABS
4.0000 mg | ORAL_TABLET | Freq: Four times a day (QID) | ORAL | Status: DC | PRN
  Administered 2014-07-24 – 2014-07-25 (×2): 4 mg via ORAL
  Filled 2014-07-22 (×2): qty 1

## 2014-07-22 MED ORDER — ONDANSETRON HCL 4 MG/2ML IJ SOLN
INTRAMUSCULAR | Status: DC | PRN
  Administered 2014-07-22: 4 mg via INTRAVENOUS

## 2014-07-22 MED ORDER — KETOROLAC TROMETHAMINE 15 MG/ML IJ SOLN
7.5000 mg | Freq: Four times a day (QID) | INTRAMUSCULAR | Status: AC | PRN
  Administered 2014-07-22 (×2): 7.5 mg via INTRAVENOUS
  Filled 2014-07-22 (×2): qty 1

## 2014-07-22 MED ORDER — TRAMADOL HCL 50 MG PO TABS
50.0000 mg | ORAL_TABLET | Freq: Four times a day (QID) | ORAL | Status: DC | PRN
  Administered 2014-07-23 – 2014-07-25 (×5): 100 mg via ORAL
  Filled 2014-07-22 (×5): qty 2

## 2014-07-22 MED ORDER — DEXAMETHASONE SODIUM PHOSPHATE 10 MG/ML IJ SOLN
10.0000 mg | Freq: Once | INTRAMUSCULAR | Status: AC
  Administered 2014-07-23: 10 mg via INTRAVENOUS
  Filled 2014-07-22: qty 1

## 2014-07-22 MED ORDER — LACTATED RINGERS IV SOLN
INTRAVENOUS | Status: DC
  Administered 2014-07-22 (×3): via INTRAVENOUS

## 2014-07-22 MED ORDER — SODIUM CHLORIDE 0.9 % IJ SOLN
INTRAMUSCULAR | Status: AC
  Filled 2014-07-22: qty 50

## 2014-07-22 MED ORDER — FENTANYL CITRATE (PF) 100 MCG/2ML IJ SOLN
25.0000 ug | INTRAMUSCULAR | Status: DC | PRN
Start: 2014-07-22 — End: 2014-07-22
  Administered 2014-07-22 (×2): 50 ug via INTRAVENOUS

## 2014-07-22 MED ORDER — INSULIN NPH (HUMAN) (ISOPHANE) 100 UNIT/ML ~~LOC~~ SUSP
30.0000 [IU] | Freq: Every day | SUBCUTANEOUS | Status: DC
  Administered 2014-07-23 – 2014-07-25 (×3): 30 [IU] via SUBCUTANEOUS
  Filled 2014-07-22: qty 10

## 2014-07-22 MED ORDER — DOCUSATE SODIUM 100 MG PO CAPS
100.0000 mg | ORAL_CAPSULE | Freq: Two times a day (BID) | ORAL | Status: DC
  Administered 2014-07-22 – 2014-07-25 (×6): 100 mg via ORAL

## 2014-07-22 MED ORDER — METOCLOPRAMIDE HCL 5 MG/ML IJ SOLN
5.0000 mg | Freq: Three times a day (TID) | INTRAMUSCULAR | Status: DC | PRN
  Administered 2014-07-22 – 2014-07-24 (×2): 10 mg via INTRAVENOUS
  Filled 2014-07-22 (×2): qty 2

## 2014-07-22 MED ORDER — PROPOFOL 10 MG/ML IV BOLUS
INTRAVENOUS | Status: AC
  Filled 2014-07-22: qty 40

## 2014-07-22 MED ORDER — SODIUM CHLORIDE 0.9 % IV SOLN: INTRAVENOUS | Status: DC

## 2014-07-22 MED ORDER — FUROSEMIDE 40 MG PO TABS
40.0000 mg | ORAL_TABLET | Freq: Two times a day (BID) | ORAL | Status: DC
  Administered 2014-07-23 – 2014-07-25 (×5): 40 mg via ORAL
  Filled 2014-07-22 (×7): qty 1

## 2014-07-22 MED ORDER — RIVAROXABAN 10 MG PO TABS
10.0000 mg | ORAL_TABLET | Freq: Every day | ORAL | Status: DC
  Administered 2014-07-23 – 2014-07-25 (×3): 10 mg via ORAL
  Filled 2014-07-22 (×4): qty 1

## 2014-07-22 MED ORDER — TRANEXAMIC ACID 1000 MG/10ML IV SOLN
2000.0000 mg | Freq: Once | INTRAVENOUS | Status: DC
  Filled 2014-07-22: qty 20

## 2014-07-22 MED ORDER — IRBESARTAN 150 MG PO TABS
150.0000 mg | ORAL_TABLET | Freq: Two times a day (BID) | ORAL | Status: DC
  Administered 2014-07-22 – 2014-07-25 (×6): 150 mg via ORAL
  Filled 2014-07-22 (×8): qty 1

## 2014-07-22 MED ORDER — BUPIVACAINE HCL (PF) 0.25 % IJ SOLN
INTRAMUSCULAR | Status: AC
  Filled 2014-07-22: qty 30

## 2014-07-22 MED ORDER — DIPHENHYDRAMINE HCL 12.5 MG/5ML PO ELIX: 12.5000 mg | ORAL_SOLUTION | ORAL | Status: DC | PRN

## 2014-07-22 MED ORDER — FLEET ENEMA 7-19 GM/118ML RE ENEM
1.0000 | ENEMA | Freq: Once | RECTAL | Status: AC | PRN
Start: 2014-07-22 — End: 2014-07-22

## 2014-07-22 MED ORDER — INSULIN ASPART 100 UNIT/ML ~~LOC~~ SOLN
0.0000 [IU] | Freq: Three times a day (TID) | SUBCUTANEOUS | Status: DC
  Administered 2014-07-22: 5 [IU] via SUBCUTANEOUS
  Administered 2014-07-23 (×2): 3 [IU] via SUBCUTANEOUS
  Administered 2014-07-23: 5 [IU] via SUBCUTANEOUS
  Administered 2014-07-24: 3 [IU] via SUBCUTANEOUS

## 2014-07-22 MED ORDER — ACETAMINOPHEN 650 MG RE SUPP: 650.0000 mg | Freq: Four times a day (QID) | RECTAL | Status: DC | PRN

## 2014-07-22 MED ORDER — MEPERIDINE HCL 50 MG/ML IJ SOLN: 6.2500 mg | INTRAMUSCULAR | Status: DC | PRN

## 2014-07-22 MED ORDER — GABAPENTIN 100 MG PO CAPS
200.0000 mg | ORAL_CAPSULE | Freq: Every day | ORAL | Status: DC
  Administered 2014-07-22 – 2014-07-24 (×3): 200 mg via ORAL
  Filled 2014-07-22 (×4): qty 2

## 2014-07-22 MED ORDER — VANCOMYCIN HCL IN DEXTROSE 1-5 GM/200ML-% IV SOLN
1000.0000 mg | Freq: Two times a day (BID) | INTRAVENOUS | Status: AC
  Administered 2014-07-22: 1000 mg via INTRAVENOUS
  Filled 2014-07-22: qty 200

## 2014-07-22 MED ORDER — MENTHOL 3 MG MT LOZG: 1.0000 | LOZENGE | OROMUCOSAL | Status: DC | PRN

## 2014-07-22 MED ORDER — FENTANYL CITRATE (PF) 100 MCG/2ML IJ SOLN
INTRAMUSCULAR | Status: DC | PRN
  Administered 2014-07-22 (×4): 50 ug via INTRAVENOUS

## 2014-07-22 MED ORDER — BUPIVACAINE HCL 0.25 % IJ SOLN
INTRAMUSCULAR | Status: DC | PRN
  Administered 2014-07-22: 30 mL

## 2014-07-22 MED ORDER — PROPOFOL 10 MG/ML IV BOLUS
INTRAVENOUS | Status: DC | PRN
  Administered 2014-07-22: 30 mg via INTRAVENOUS
  Administered 2014-07-22: 150 mg via INTRAVENOUS

## 2014-07-22 SURGICAL SUPPLY — 61 items
BAG DECANTER FOR FLEXI CONT (MISCELLANEOUS) ×3 IMPLANT
BAG ZIPLOCK 12X15 (MISCELLANEOUS) ×3 IMPLANT
BANDAGE ELASTIC 6 VELCRO ST LF (GAUZE/BANDAGES/DRESSINGS) ×3 IMPLANT
BANDAGE ESMARK 6X9 LF (GAUZE/BANDAGES/DRESSINGS) ×1 IMPLANT
BLADE SAG 18X100X1.27 (BLADE) ×3 IMPLANT
BLADE SAW SGTL 11.0X1.19X90.0M (BLADE) ×3 IMPLANT
BNDG ESMARK 6X9 LF (GAUZE/BANDAGES/DRESSINGS) ×3
BOWL SMART MIX CTS (DISPOSABLE) ×3 IMPLANT
CAP KNEE TOTAL 3 SIGMA ×3 IMPLANT
CEMENT HV SMART SET (Cement) ×6 IMPLANT
CLOSURE WOUND 1/2 X4 (GAUZE/BANDAGES/DRESSINGS) ×1
CUFF TOURN SGL QUICK 34 (TOURNIQUET CUFF) ×2
CUFF TRNQT CYL 34X4X40X1 (TOURNIQUET CUFF) ×1 IMPLANT
DECANTER SPIKE VIAL GLASS SM (MISCELLANEOUS) ×3 IMPLANT
DRAPE EXTREMITY T 121X128X90 (DRAPE) ×3 IMPLANT
DRAPE POUCH INSTRU U-SHP 10X18 (DRAPES) ×3 IMPLANT
DRAPE U-SHAPE 47X51 STRL (DRAPES) ×3 IMPLANT
DRSG ADAPTIC 3X8 NADH LF (GAUZE/BANDAGES/DRESSINGS) ×3 IMPLANT
DRSG PAD ABDOMINAL 8X10 ST (GAUZE/BANDAGES/DRESSINGS) ×3 IMPLANT
DURAPREP 26ML APPLICATOR (WOUND CARE) ×3 IMPLANT
ELECT REM PT RETURN 9FT ADLT (ELECTROSURGICAL) ×3
ELECTRODE REM PT RTRN 9FT ADLT (ELECTROSURGICAL) ×1 IMPLANT
EVACUATOR 1/8 PVC DRAIN (DRAIN) ×3 IMPLANT
FACESHIELD WRAPAROUND (MASK) ×15 IMPLANT
GAUZE SPONGE 4X4 12PLY STRL (GAUZE/BANDAGES/DRESSINGS) ×3 IMPLANT
GLOVE BIO SURGEON STRL SZ7.5 (GLOVE) ×3 IMPLANT
GLOVE BIO SURGEON STRL SZ8 (GLOVE) ×6 IMPLANT
GLOVE BIOGEL PI IND STRL 6.5 (GLOVE) IMPLANT
GLOVE BIOGEL PI IND STRL 8 (GLOVE) ×1 IMPLANT
GLOVE BIOGEL PI INDICATOR 6.5 (GLOVE)
GLOVE BIOGEL PI INDICATOR 8 (GLOVE) ×2
GLOVE SURG SS PI 6.5 STRL IVOR (GLOVE) IMPLANT
GOWN STRL REUS W/TWL LRG LVL3 (GOWN DISPOSABLE) ×3 IMPLANT
GOWN STRL REUS W/TWL XL LVL3 (GOWN DISPOSABLE) IMPLANT
HANDPIECE INTERPULSE COAX TIP (DISPOSABLE) ×2
IMMOBILIZER KNEE 20 (SOFTGOODS) ×3
IMMOBILIZER KNEE 20 THIGH 36 (SOFTGOODS) ×1 IMPLANT
KIT BASIN OR (CUSTOM PROCEDURE TRAY) ×3 IMPLANT
MANIFOLD NEPTUNE II (INSTRUMENTS) ×3 IMPLANT
NDL SAFETY ECLIPSE 18X1.5 (NEEDLE) ×3 IMPLANT
NEEDLE HYPO 18GX1.5 SHARP (NEEDLE) ×6
NS IRRIG 1000ML POUR BTL (IV SOLUTION) ×3 IMPLANT
PACK TOTAL JOINT (CUSTOM PROCEDURE TRAY) ×3 IMPLANT
PADDING CAST COTTON 6X4 STRL (CAST SUPPLIES) ×3 IMPLANT
PEN SKIN MARKING BROAD (MISCELLANEOUS) ×3 IMPLANT
POSITIONER SURGICAL ARM (MISCELLANEOUS) ×3 IMPLANT
SET HNDPC FAN SPRY TIP SCT (DISPOSABLE) ×1 IMPLANT
STRIP CLOSURE SKIN 1/2X4 (GAUZE/BANDAGES/DRESSINGS) ×2 IMPLANT
SUCTION FRAZIER 12FR DISP (SUCTIONS) ×3 IMPLANT
SUT MNCRL AB 4-0 PS2 18 (SUTURE) ×3 IMPLANT
SUT VIC AB 2-0 CT1 27 (SUTURE) ×6
SUT VIC AB 2-0 CT1 TAPERPNT 27 (SUTURE) ×3 IMPLANT
SUT VLOC 180 0 24IN GS25 (SUTURE) ×3 IMPLANT
SYR 20CC LL (SYRINGE) ×3 IMPLANT
SYR 50ML LL SCALE MARK (SYRINGE) ×3 IMPLANT
TOWEL OR 17X26 10 PK STRL BLUE (TOWEL DISPOSABLE) ×3 IMPLANT
TOWEL OR NON WOVEN STRL DISP B (DISPOSABLE) ×3 IMPLANT
TRAY FOLEY W/METER SILVER 14FR (SET/KITS/TRAYS/PACK) ×3 IMPLANT
WATER STERILE IRR 1500ML POUR (IV SOLUTION) ×3 IMPLANT
WRAP KNEE MAXI GEL POST OP (GAUZE/BANDAGES/DRESSINGS) ×3 IMPLANT
YANKAUER SUCT BULB TIP 10FT TU (MISCELLANEOUS) ×3 IMPLANT

## 2014-07-22 NOTE — Op Note (Signed)
Pre-operative diagnosis- Osteoarthritis  Left knee(s)  Post-operative diagnosis- Osteoarthritis Left knee(s)  Procedure-  Left  Total Knee Arthroplasty  Surgeon- Dione Plover. Primus Gritton, MD  Assistant- Arlee Muslim, PA-C   Anesthesia-  General  EBL-* No blood loss amount entered *   Drains Hemovac  Tourniquet time-  No tourniquet utilized   Complications- None  Condition-PACU - hemodynamically stable.   Brief Clinical Note  [name] is a 81 y.o. year old female with end stage OA of her left knee with progressively worsening pain and dysfunction. She has constant pain, with activity and at rest and significant functional deficits with difficulties even with ADLs. She has had extensive non-op management including analgesics, injections of cortisone and viscosupplements, and home exercise program, but remains in significant pain with significant dysfunction. Radiographs show bone on bone arthritis medial and patellofemoral. She presents now for left Total Knee Arthroplasty.    Procedure in detail---   The patient is brought into the operating room and positioned supine on the operating table. After successful administration of  General,   a tourniquet is placed high on the  Left thigh(s) and the lower extremity is prepped and draped in the usual sterile fashion. Time out is performed by the operating team and then the  Left lknee flexed and a midline incision is made with a ten blade through the subcutaneous tissue to the level of the extensor mechanism. A fresh blade is used to make a medial parapatellar arthrotomy. Soft tissue over the proximal medial tibia is subperiosteally elevated to the joint line with a knife and into the semimembranosus bursa with a Cobb elevator. Soft tissue over the proximal lateral tibia is elevated with attention being paid to avoiding the patellar tendon on the tibial tubercle. The patella is everted, knee flexed 90 degrees and the ACL and PCL are removed.  Findings are bone on bone medial and patellofemoral with large medial osteophytes.        The drill is used to create a starting hole in the distal femur and the canal is thoroughly irrigated with sterile saline to remove the fatty contents. The 5 degree Left  valgus alignment guide is placed into the femoral canal and the distal femoral cutting block is pinned to remove 10 mm off the distal femur. Resection is made with an oscillating saw.      The tibia is subluxed forward and the menisci are removed. The extramedullary alignment guide is placed referencing proximally at the medial aspect of the tibial tubercle and distally along the second metatarsal axis and tibial crest. The block is pinned to remove 61mm off the more deficient medial  side. Resection is made with an oscillating saw. Size 3is the most appropriate size for the tibia and the proximal tibia is prepared with the modular drill and keel punch for that size.      The femoral sizing guide is placed and size 4 is most appropriate. Rotation is marked off the epicondylar axis and confirmed by creating a rectangular flexion gap at 90 degrees. The size 4 cutting block is pinned in this rotation and the anterior, posterior and chamfer cuts are made with the oscillating saw. The intercondylar block is then placed and that cut is made.      Trial size 3 tibial component, trial size 4 narrow posterior stabilized femur and a 10  mm posterior stabilized rotating platform insert trial is placed. Full extension is achieved with excellent varus/valgus and anterior/posterior balance throughout full  range of motion. The patella is everted and thickness measured to be 24  mm. Free hand resection is taken to 14 mm, a 38 template is placed, lug holes are drilled, trial patella is placed, and it tracks normally. Osteophytes are removed off the posterior femur with the trial in place. All trials are removed and the cut bone surfaces prepared with pulsatile lavage.  Cement is mixed and once ready for implantation, the size 3 tibial implant, size  4 narrow posterior stabilized femoral component, and the size 38 patella are cemented in place and the patella is held with the clamp. The trial insert is placed and the knee held in full extension. The Exparel (20 ml mixed with 30 ml saline) and .25% Bupivicaine, are injected into the extensor mechanism, posterior capsule, medial and lateral gutters and subcutaneous tissues.  All extruded cement is removed and once the cement is hard the permanent 10 mm posterior stabilized rotating platform insert is placed into the tibial tray.      The wound is copiously irrigated with saline solution and the extensor mechanism closed over a hemovac drain with #1 V-loc suture. Flexion against gravity is 140 degrees and the patella tracks normally. Subcutaneous tissue is closed with 2.0 vicryl and subcuticular with running 4.0 Monocryl. The incision is cleaned and dried and steri-strips and a bulky sterile dressing are applied. The limb is placed into a knee immobilizer and the patient is awakened and transported to recovery in stable condition.      Please note that a surgical assistant was a medical necessity for this procedure in order to perform it in a safe and expeditious manner. Surgical assistant was necessary to retract the ligaments and vital neurovascular structures to prevent injury to them and also necessary for proper positioning of the limb to allow for anatomic placement of the prosthesis.   Dione Plover Eola Waldrep, MD    07/22/2014, 11:22 AM

## 2014-07-22 NOTE — H&P (View-Only) (Signed)
[name] DOB: [birth year] Married / Language: English / Race: White Female Date of Admission:  07/22/2014 CC:  Left Knee Pain History of Present Illness  The patient is a 81 year old female who comes in for a preoperative History and Physical. The patient is scheduled for a left total knee arthroplasty to be performed by Dr. Dione Plover. Aluisio, MD at [facility] on 07-22-2014. The patient is a 81 year old female who presented for follow up of their knee. The patient is being followed for their left knee pain and osteoarthritis. They are many month(s) out from Synvisc Injections. Symptoms reported include: pain, pain at night and pain with weightbearing. The patient feels that they are doing poorly and report their pain level to be moderate. Current treatment includes: bracing and pain medications. The following medication has been used for pain control: Tylenol. The patient indicates that they have questions or concerns today regarding surgery. Unfortunately, the pain has gotten progressively worse. She is at a point where she is hurting all of the time. She is at a stage where she wants to go ahead and get this fixed surgically. Unfortunately, her husband, Rito Ehrlich, recently passed away. They were married for 60 years and this has already had a negative impact on her. She feels that if she goes ahead and does the surgery, that she will be able to get around better and take better care of herself, once she recovers. She has gotten advanced arthritis of the left knee with progressively worsening pain and dysfunction. Cortisone and Visco supplements did not help. At this point, the most predictable means of improving her pain and function will be total knee arthroplasty. We discussed the changes in this procedure today, compared to when she had it done on the other side, several years ago. She is encouraged by these changes. She is ready to proceed at this time. They have been treated  conservatively in the past for the above stated problem and despite conservative measures, they continue to have progressive pain and severe functional limitations and dysfunction. They have failed non-operative management including home exercise, medications, and injections. It is felt that they would benefit from undergoing total joint replacement. Risks and benefits of the procedure have been discussed with the patient and they elect to proceed with surgery. There are no active contraindications to surgery such as ongoing infection or rapidly progressive neurological disease.  Problem List/Past Medical  Mallet finger of left hand (M20.012) Achilles tendinitis (M76.60) Breast Cancer Right-sided Primary localized osteoarthritis of left knee (M17.12) Diabetes Mellitus, Type II High blood pressure Pneumonia Past History Diabetic Retinopathy Carotid Artery Stenosis Heart murmur Anxiety Disorder Cataract  Allergies Shellfish Denies any issues with topical Betadine Aspirin *ANALGESICS - NonNarcotic* in full doses but tolerates the baby aspirin  Family History Osteoarthritis mother Cancer father  Social History Drug/Alcohol Rehab (Previously) no Drug/Alcohol Rehab (Currently) no Number of flights of stairs before winded greater than 5 Marital status married Living situation live with spouse Tobacco use Never smoker. never smoker Children 4 Alcohol use never consumed alcohol Exercise Exercises rarely; does other Current work status retired Tobacco / smoke exposure no Pain Contract no Illicit drug use no Advance Directives Living Will, Healthcare POA  Medication History AmLODIPine Besylate (10MG  Tablet, Oral) Active. Aspirin EC (81MG  Tablet DR, Oral) Active. Calcitriol (0.25MCG Capsule, Oral) Active. Fish Oil Active. Gabapentin (100MG  Capsule, Oral) Active. Diovan (320MG  Tablet, Oral) Active. Furosemide (40MG  Tablet, Oral) Active. Xanax  (0.25MG  Tablet, Oral daily)  Active. TraMADol HCl (50MG  Tablet, Oral daily) Active.   Past Surgical History Rotator Cuff Repair right Tonsillectomy Date: 78. Total Knee Replacement Date: 2004. right Mastectomy Date: 24. right Arthroscopy of Shoulder right Cataract Extraction-Bilateral   Review of Systems General Not Present- Chills, Fatigue, Fever, Memory Loss, Night Sweats, Weight Gain and Weight Loss. Skin Not Present- Eczema, Hives, Itching, Lesions and Rash. HEENT Not Present- Dentures, Double Vision, Headache, Hearing Loss, Tinnitus and Visual Loss. Respiratory Not Present- Allergies, Chronic Cough, Coughing up blood, Shortness of breath at rest and Shortness of breath with exertion. Cardiovascular Not Present- Chest Pain, Difficulty Breathing Lying Down, Murmur, Palpitations, Racing/skipping heartbeats and Swelling. Gastrointestinal Not Present- Abdominal Pain, Bloody Stool, Constipation, Diarrhea, Difficulty Swallowing, Heartburn, Jaundice, Loss of appetitie, Nausea and Vomiting. Female Genitourinary Not Present- Blood in Urine, Discharge, Flank Pain, Incontinence, Painful Urination, Urgency, Urinary frequency, Urinary Retention, Urinating at Night and Weak urinary stream. Musculoskeletal Present- Joint Pain. Not Present- Back Pain, Joint Swelling, Morning Stiffness, Muscle Pain, Muscle Weakness and Spasms. Neurological Not Present- Blackout spells, Difficulty with balance, Dizziness, Paralysis, Tremor and Weakness. Psychiatric Not Present- Insomnia.  Vitals  Weight: 143.38 lb Height: 64in Body Surface Area: 1.7 m Body Mass Index: 24.61 kg/m  BP: 164/64 (Sitting, Left Arm, Standard)   Physical Exam  General Mental Status -Alert, cooperative and good historian. General Appearance-pleasant, Not in acute distress. Orientation-Oriented X3. Build & Nutrition-Well nourished and Well developed.  Head and Neck Head-normocephalic, atraumatic  . Neck Global Assessment - bruit auscultated on the right, bruit auscultated on the left and supple.  Eye Pupil - Bilateral-Regular and Round. Motion - Bilateral-EOMI.  Chest and Lung Exam Auscultation Breath sounds - clear at anterior chest wall and clear at posterior chest wall. Adventitious sounds - No Adventitious sounds.  Cardiovascular Auscultation Rhythm - Regular rate and rhythm. Heart Sounds - S1 WNL and S2 WNL. Murmurs & Other Heart Sounds: Murmur 1 - Location - Aortic Area, Left Carotid, Right Carotid and Sternal Border - Left. Timing - Holosystolic. Grade - III/VI. Character - Crescendo/Decrescendo.  Abdomen Palpation/Percussion Tenderness - Abdomen is non-tender to palpation. Rigidity (guarding) - Abdomen is soft. Auscultation Auscultation of the abdomen reveals - Bowel sounds normal.  Female Genitourinary Note: Not done, not pertinent to present illness   Musculoskeletal Note: On examination, she is alert and oriented in no apparent distress. Evaluation of her left knee shows trace effusion. She has marked crepitus on range of motion of the knee. Her range is about 5 to 120. She is tender medial greater than lateral with no instability noted. Pulses, sensation and motor are intact.  RADIOGRAPHS: We reviewed her radiographs from last year and she is bone-on-bone of the medial and patellofemoral compartments with lateral narrowing also. I saw no need to repeat the radiographs today, given the severe end-stage arthritis that was present then, and the fact that it is not going to be any better now.   Assessment & Plan Primary osteoarthritis of left knee (M17.12) Note:Surgical Plans: Left Total Knee Repalcement  Disposition: Home with Family  PCP: Dr. Mayra Neer - Patient has been seen preoperatively and felt to be stable for surgery. Cards: Dr. Radford Pax - Patient has been seen preoperatively and felt to be stable for surgery. Pulmonary: Dr.  Halford Chessman Reanl: Dr. Jimmy Footman  Topical TXA - Breast Cancer AVOID RIGHT ARM FOR IV'S/BP'S  Anesthesia Issues: Had postop nausea and vomiting in the past.  Signed electronically by Joelene Millin, III PA-C

## 2014-07-22 NOTE — Anesthesia Procedure Notes (Addendum)
Procedure Name: LMA Insertion Date/Time: 07/22/2014 11:14 AM Performed by: Lissa Morales Pre-anesthesia Checklist: Patient identified, Emergency Drugs available, Suction available and Patient being monitored Patient Re-evaluated:Patient Re-evaluated prior to inductionOxygen Delivery Method: Circle System Utilized Preoxygenation: Pre-oxygenation with 100% oxygen Intubation Type: IV induction Ventilation: Mask ventilation without difficulty LMA: LMA with gastric port inserted LMA Size: 4.0 Tube type: Oral Number of attempts: 1 Airway Equipment and Method: Oral airway Placement Confirmation: positive ETCO2 and breath sounds checked- equal and bilateral Tube secured with: Tape Dental Injury: Teeth and Oropharynx as per pre-operative assessment  Comments: Spinal  Pt 's feet only numb.still able to move legs so proceeded to GA  LMA   Spinal  End time: 07/22/2014 10:27 AM Staffing Resident/CRNA: Enrigue Catena E Preanesthetic Checklist Completed: patient identified, site marked, surgical consent, pre-op evaluation, timeout performed, IV checked, risks and benefits discussed and monitors and equipment checked Spinal Block Patient position: sitting Prep: Betadine Patient monitoring: heart rate, continuous pulse ox and blood pressure Approach: right paramedian Location: L4-5 Injection technique: single-shot Needle Needle type: Spinocan  Needle gauge: 22 G Additional Notes Pt tolerated spinal well. Marland Kitchen Spinal kit and drugs within date. CSF x3, negative heme and  Or paresthesia.

## 2014-07-22 NOTE — Transfer of Care (Signed)
Immediate Anesthesia Transfer of Care Note  Patient: [name]  Procedure(s) Performed: Procedure(s) with comments: LEFT TOTAL KNEE ARTHROPLASTY (Left) - also LMA  Patient Location: PACU  Anesthesia Type:General and converted from failed spinal.  Level of Consciousness: awake, alert  and oriented  Airway & Oxygen Therapy: Patient Spontanous Breathing and Patient connected to face mask oxygen  Post-op Assessment: Report given to RN, Post -op Vital signs reviewed and stable and able to wiggle toes but denies knee pain.  Post vital signs: Reviewed and stable  Last Vitals:  Filed Vitals:   07/22/14 0727  BP: 185/62  Pulse: 75  Temp: 36.5 C  Resp: 16    Complications: No apparent anesthesia complications

## 2014-07-22 NOTE — Anesthesia Preprocedure Evaluation (Signed)
Anesthesia Evaluation  Patient identified by MRN, date of birth, ID band Patient awake    Reviewed: Allergy & Precautions, NPO status , Patient's Chart, lab work & pertinent test results  History of Anesthesia Complications (+) PONV  Airway Mallampati: II  TM Distance: >3 FB Neck ROM: Full    Dental no notable dental hx.    Pulmonary asthma ,  breath sounds clear to auscultation  Pulmonary exam normal       Cardiovascular hypertension, Pt. on medications + Valvular Problems/Murmurs MR Rhythm:Regular Rate:Normal     Neuro/Psych negative neurological ROS  negative psych ROS   GI/Hepatic negative GI ROS, Neg liver ROS,   Endo/Other  diabetes, Type 2, Oral Hypoglycemic Agents  Renal/GU negative Renal ROS  negative genitourinary   Musculoskeletal negative musculoskeletal ROS (+)   Abdominal   Peds negative pediatric ROS (+)  Hematology negative hematology ROS (+)   Anesthesia Other Findings   Reproductive/Obstetrics negative OB ROS                             Anesthesia Physical Anesthesia Plan  ASA: III  Anesthesia Plan: Spinal   Post-op Pain Management:    Induction:   Airway Management Planned: Simple Face Mask  Additional Equipment:   Intra-op Plan:   Post-operative Plan:   Informed Consent: I have reviewed the patients History and Physical, chart, labs and discussed the procedure including the risks, benefits and alternatives for the proposed anesthesia with the patient or authorized representative who has indicated his/her understanding and acceptance.   Dental advisory given  Plan Discussed with: CRNA  Anesthesia Plan Comments:         Anesthesia Quick Evaluation

## 2014-07-22 NOTE — Anesthesia Postprocedure Evaluation (Signed)
  Anesthesia Post-op Note  Patient: [name]  Procedure(s) Performed: Procedure(s) (LRB): LEFT TOTAL KNEE ARTHROPLASTY (Left)  Patient Location: PACU  Anesthesia Type: General  Level of Consciousness: awake and alert   Airway and Oxygen Therapy: Patient Spontanous Breathing  Post-op Pain: mild  Post-op Assessment: Post-op Vital signs reviewed, Patient's Cardiovascular Status Stable, Respiratory Function Stable, Patent Airway and No signs of Nausea or vomiting  Last Vitals:  Filed Vitals:   07/22/14 1606  BP: 166/54  Pulse: 80  Temp: 36.5 C  Resp: 14    Post-op Vital Signs: stable   Complications: No apparent anesthesia complications

## 2014-07-22 NOTE — Progress Notes (Signed)
PACU note----Dr. Marcell Barlow notified of pt's elevated blood pressure and heart rate 70's-80's; continue to observe ; no new orders

## 2014-07-22 NOTE — Progress Notes (Signed)
Utilization review completed.  

## 2014-07-22 NOTE — Interval H&P Note (Signed)
History and Physical Interval Note:  07/22/2014 9:27 AM  [name]  has presented today for surgery, with the diagnosis of oa left knee   The various methods of treatment have been discussed with the patient and family. After consideration of risks, benefits and other options for treatment, the patient has consented to  Procedure(s): LEFT TOTAL KNEE ARTHROPLASTY (Left) as a surgical intervention .  The patient's history has been reviewed, patient examined, no change in status, stable for surgery.  I have reviewed the patient's chart and labs.  Questions were answered to the patient's satisfaction.     Gearlean Alf

## 2014-07-23 LAB — CBC
HEMATOCRIT: 30.1 % — AB (ref 36.0–46.0)
HEMOGLOBIN: 9.9 g/dL — AB (ref 12.0–15.0)
MCH: 28.4 pg (ref 26.0–34.0)
MCHC: 32.9 g/dL (ref 30.0–36.0)
MCV: 86.5 fL (ref 78.0–100.0)
Platelets: 224 10*3/uL (ref 150–400)
RBC: 3.48 MIL/uL — ABNORMAL LOW (ref 3.87–5.11)
RDW: 14.1 % (ref 11.5–15.5)
WBC: 10.8 10*3/uL — ABNORMAL HIGH (ref 4.0–10.5)

## 2014-07-23 LAB — GLUCOSE, CAPILLARY
GLUCOSE-CAPILLARY: 240 mg/dL — AB (ref 70–99)
Glucose-Capillary: 280 mg/dL — ABNORMAL HIGH (ref 70–99)
Glucose-Capillary: 184 mg/dL — ABNORMAL HIGH (ref 70–99)
Glucose-Capillary: 183 mg/dL — ABNORMAL HIGH (ref 70–99)
Glucose-Capillary: 234 mg/dL — ABNORMAL HIGH (ref 70–99)

## 2014-07-23 LAB — BASIC METABOLIC PANEL
ANION GAP: 6 (ref 5–15)
BUN: 25 mg/dL — AB (ref 6–23)
CO2: 23 mmol/L (ref 19–32)
CREATININE: 1.15 mg/dL — AB (ref 0.50–1.10)
Calcium: 8.3 mg/dL — ABNORMAL LOW (ref 8.4–10.5)
Chloride: 105 mmol/L (ref 96–112)
GFR calc Af Amer: 49 mL/min — ABNORMAL LOW (ref >90)
GFR calc non Af Amer: 42 mL/min — ABNORMAL LOW (ref >90)
GLUCOSE: 257 mg/dL — AB (ref 70–99)
Potassium: 4.5 mmol/L (ref 3.5–5.1)
Sodium: 134 mmol/L — ABNORMAL LOW (ref 135–145)

## 2014-07-23 MED ORDER — TRAMADOL HCL 50 MG PO TABS: 50.0000 mg | ORAL_TABLET | Freq: Four times a day (QID) | ORAL | Status: DC | PRN

## 2014-07-23 MED ORDER — METHOCARBAMOL 500 MG PO TABS: 500.0000 mg | ORAL_TABLET | Freq: Four times a day (QID) | ORAL | Status: DC | PRN

## 2014-07-23 MED ORDER — OXYCODONE HCL 5 MG PO TABS: 5.0000 mg | ORAL_TABLET | ORAL | Status: DC | PRN

## 2014-07-23 MED ORDER — RIVAROXABAN 10 MG PO TABS: 10.0000 mg | ORAL_TABLET | Freq: Every day | ORAL | Status: DC

## 2014-07-23 NOTE — Evaluation (Signed)
Physical Therapy Evaluation Patient Details Name: [name] MRN: [id number] DOB: 07/01/1926 Today's Date: 07/23/2014   History of Present Illness  L tka  Clinical Impression  Patient tolerated very well, did not require KI today. Patient will benefit from PT to address  Problems listed in note below.    Follow Up Recommendations Home health PT;Supervision/Assistance - 24 hour    Equipment Recommendations  None recommended by PT    Recommendations for Other Services       Precautions / Restrictions Precautions Precautions: Fall;Knee Required Braces or Orthoses: Knee Immobilizer - Left Knee Immobilizer - Left: Discontinue once straight leg raise with < 10 degree lag (patient able to perform SLR today.)      Mobility  Bed Mobility Overal bed mobility: Needs Assistance Bed Mobility: Sit to Supine       Sit to supine: Min assist   General bed mobility comments: assist  with L leg onto bed.  Transfers Overall transfer level: Needs assistance Equipment used: Rolling walker (2 wheeled) Transfers: Sit to/from Stand Sit to Stand: Min guard         General transfer comment: cues for safety and for L leg position  Ambulation/Gait Ambulation/Gait assistance: Min assist Ambulation Distance (Feet): 90 Feet Assistive device: Rolling walker (2 wheeled) Gait Pattern/deviations: Step-to pattern;Step-through pattern;Antalgic     General Gait Details: cues for sequence and for posture  Stairs            Wheelchair Mobility    Modified Rankin (Stroke Patients Only)       Balance                                             Pertinent Vitals/Pain Pain Assessment: 0-10 Pain Score: 4  Pain Location: L knee Pain Descriptors / Indicators: Aching;Discomfort Pain Intervention(s): Ice applied;Premedicated before session;Limited activity within patient's tolerance    Home Living Family/patient expects to be discharged to:: Private  residence Living Arrangements: Alone Available Help at Discharge: Family;Friend(s) Type of Home: House Home Access: Stairs to enter Entrance Stairs-Rails: Right Entrance Stairs-Number of Steps: 2 Home Layout: One level Home Equipment: Walker - 2 wheels      Prior Function Level of Independence: Independent               Hand Dominance        Extremity/Trunk Assessment               Lower Extremity Assessment: LLE deficits/detail   LLE Deficits / Details: able to perform  a SLR.  Cervical / Trunk Assessment: Normal  Communication   Communication: No difficulties  Cognition Arousal/Alertness: Awake/alert Behavior During Therapy: WFL for tasks assessed/performed Overall Cognitive Status: Within Functional Limits for tasks assessed                      General Comments      Exercises Total Joint Exercises Ankle Circles/Pumps: AROM;Both;10 reps;Supine Quad Sets: AROM;Both;10 reps;Supine Short Arc Quad: AAROM;Left;10 reps;Supine Heel Slides: AAROM;Left;10 reps;Supine Hip ABduction/ADduction: AAROM;Left;10 reps;Supine Straight Leg Raises: AAROM;Left;10 reps;Supine      Assessment/Plan    PT Assessment Patient needs continued PT services  PT Diagnosis Difficulty walking;Acute pain   PT Problem List Decreased strength;Decreased range of motion;Decreased activity tolerance;Decreased mobility;Decreased safety awareness;Decreased knowledge of use of DME;Pain  PT Treatment Interventions DME instruction;Gait training;Stair training;Therapeutic activities;Functional mobility  training;Therapeutic exercise;Patient/family education   PT Goals (Current goals can be found in the Care Plan section) Acute Rehab PT Goals Patient Stated Goal: to go home, mow my grass. PT Goal Formulation: With patient/family Time For Goal Achievement: 07/30/14 Potential to Achieve Goals: Good    Frequency 7X/week   Barriers to discharge        Co-evaluation                End of Session   Activity Tolerance: Patient tolerated treatment well Patient left: in bed;with family/visitor present Nurse Communication: Mobility status         Time: 1000-1040 PT Time Calculation (min) (ACUTE ONLY): 40 min   Charges:   PT Evaluation $Initial PT Evaluation Tier I: 1 Procedure PT Treatments $Gait Training: 8-22 mins $Therapeutic Exercise: 8-22 mins   PT G Codes:        Claretha Cooper 07/23/2014, 1:22 PM Tresa Endo PT [phone]

## 2014-07-23 NOTE — Discharge Summary (Signed)
Physician Discharge Summary   Patient ID: [name] MRN: [id number] DOB/AGE: 79-Apr-1928 79 y.o.  Admit date: 07/22/2014 Discharge date: 07-25-2014  Primary Diagnosis:  Osteoarthritis Left knee(s)  Admission Diagnoses:  Past Medical History  Diagnosis Date  . Osteoarthritis   . Mild mitral regurgitation   . Diabetes mellitus     type 2 w complications of proteinuria and retinopathy  . Hypercholesterolemia   . Breast cancer 1991    right  . CKD (chronic kidney disease), stage III     from [facility]-Dr. Deterding  . Carotid artery stenosis 08/2011    nonobstructive 50-70% by doppler  . RLS (restless legs syndrome)     /insomnia-hydroxizine works well..  . Hypertension   . Complication of anesthesia   . PONV (postoperative nausea and vomiting)   . Asthma     seasonal  . Pernicious anemia   . Difficulty sleeping     due to husbands recent death  . Neuropathy    Discharge Diagnoses:   Principal Problem:   OA (osteoarthritis) of knee  Estimated body mass index is 23.46 kg/(m^2) as calculated from the following:   Height as of this encounter: _0  (1.651 m).   Weight as of this encounter: 63.957 kg (141 lb).  Procedure:  Procedure(s) (LRB): LEFT TOTAL KNEE ARTHROPLASTY (Left)   Consults: None  HPI: [name] is a 81 y.o. year old female with end stage OA of her left knee with progressively worsening pain and dysfunction. She has constant pain, with activity and at rest and significant functional deficits with difficulties even with ADLs. She has had extensive non-op management including analgesics, injections of cortisone and viscosupplements, and home exercise program, but remains in significant pain with significant dysfunction. Radiographs show bone on bone arthritis medial and patellofemoral. She presents now for left Total Knee Arthroplasty.  Laboratory Data: Admission on 07/22/2014, Discharged on 07/25/2014  Component Date Value Ref Range Status  . ABO/RH(D)  07/22/2014 A NEG   Final  . Antibody Screen 07/22/2014 POS   Final  . Sample Expiration 07/22/2014 07/25/2014   Final  . DAT, IgG 07/22/2014 NEG   Final  . Unit Number 07/22/2014 [id number]   Final  . Blood Component Type 07/22/2014 RED CELLS,LR   Final  . Unit division 07/22/2014 00   Final  . Status of Unit 07/22/2014 REL FROM [facility]   Final  . Transfusion Status 07/22/2014 OK TO TRANSFUSE   Final  . Crossmatch Result 07/22/2014 COMPATIBLE   Final  . Unit Number 07/22/2014 [id number]   Final  . Blood Component Type 07/22/2014 RED CELLS,LR   Final  . Unit division 07/22/2014 00   Final  . Status of Unit 07/22/2014 REL FROM [facility]   Final  . Transfusion Status 07/22/2014 OK TO TRANSFUSE   Final  . Crossmatch Result 07/22/2014 COMPATIBLE   Final  . Glucose-Capillary 07/22/2014 136* 70 - 99 mg/dL Final  . Comment 1 07/22/2014 Notify RN   Final  . Glucose-Capillary 07/22/2014 138* 70 - 99 mg/dL Final  . Comment 1 07/22/2014 Notify RN   Final  . Comment 2 07/22/2014 Document in Chart   Final  . WBC 07/23/2014 10.8* 4.0 - 10.5 K/uL Final  . RBC 07/23/2014 3.48* 3.87 - 5.11 MIL/uL Final  . Hemoglobin 07/23/2014 9.9* 12.0 - 15.0 g/dL Final  . HCT 07/23/2014 30.1* 36.0 - 46.0 % Final  . MCV 07/23/2014 86.5  78.0 - 100.0 fL Final  . MCH 07/23/2014 28.4  26.0 - 34.0 pg Final  . MCHC 07/23/2014 32.9  30.0 - 36.0 g/dL Final  . RDW 07/23/2014 14.1  11.5 - 15.5 % Final  . Platelets 07/23/2014 224  150 - 400 K/uL Final  . Sodium 07/23/2014 134* 135 - 145 mmol/L Final  . Potassium 07/23/2014 4.5  3.5 - 5.1 mmol/L Final  . Chloride 07/23/2014 105  96 - 112 mmol/L Final  . CO2 07/23/2014 23  19 - 32 mmol/L Final  . Glucose, Bld 07/23/2014 257* 70 - 99 mg/dL Final  . BUN 07/23/2014 25* 6 - 23 mg/dL Final  . Creatinine, Ser 07/23/2014 1.15* 0.50 - 1.10 mg/dL Final  . Calcium 07/23/2014 8.3* 8.4 - 10.5 mg/dL Final  . GFR calc non Af Amer 07/23/2014 42* >90 mL/min Final  . GFR calc Af Amer  07/23/2014 49* >90 mL/min Final   Comment: (NOTE) The eGFR has been calculated using the CKD EPI equation. This calculation has not been validated in all clinical situations. eGFR's persistently <90 mL/min signify possible Chronic Kidney Disease.   . Anion gap 07/23/2014 6  5 - 15 Final  . Glucose-Capillary 07/22/2014 226* 70 - 99 mg/dL Final  . Glucose-Capillary 07/22/2014 280* 70 - 99 mg/dL Final  . Glucose-Capillary 07/23/2014 184* 70 - 99 mg/dL Final  . Glucose-Capillary 07/23/2014 183* 70 - 99 mg/dL Final  . WBC 07/24/2014 13.5* 4.0 - 10.5 K/uL Final  . RBC 07/24/2014 3.43* 3.87 - 5.11 MIL/uL Final  . Hemoglobin 07/24/2014 9.8* 12.0 - 15.0 g/dL Final  . HCT 07/24/2014 29.8* 36.0 - 46.0 % Final  . MCV 07/24/2014 86.9  78.0 - 100.0 fL Final  . MCH 07/24/2014 28.6  26.0 - 34.0 pg Final  . MCHC 07/24/2014 32.9  30.0 - 36.0 g/dL Final  . RDW 07/24/2014 14.4  11.5 - 15.5 % Final  . Platelets 07/24/2014 234  150 - 400 K/uL Final  . Sodium 07/24/2014 139  135 - 145 mmol/L Final  . Potassium 07/24/2014 4.1  3.5 - 5.1 mmol/L Final  . Chloride 07/24/2014 103  96 - 112 mmol/L Final  . CO2 07/24/2014 28  19 - 32 mmol/L Final  . Glucose, Bld 07/24/2014 176* 70 - 99 mg/dL Final  . BUN 07/24/2014 29* 6 - 23 mg/dL Final  . Creatinine, Ser 07/24/2014 1.10  0.50 - 1.10 mg/dL Final  . Calcium 07/24/2014 8.8  8.4 - 10.5 mg/dL Final  . GFR calc non Af Amer 07/24/2014 44* >90 mL/min Final  . GFR calc Af Amer 07/24/2014 52* >90 mL/min Final   Comment: (NOTE) The eGFR has been calculated using the CKD EPI equation. This calculation has not been validated in all clinical situations. eGFR's persistently <90 mL/min signify possible Chronic Kidney Disease.   . Anion gap 07/24/2014 8  5 - 15 Final  . Glucose-Capillary 07/23/2014 240* 70 - 99 mg/dL Final  . Glucose-Capillary 07/23/2014 234* 70 - 99 mg/dL Final  . Glucose-Capillary 07/24/2014 139* 70 - 99 mg/dL Final  . Comment 1 07/24/2014 Notify RN    Final  . Comment 2 07/24/2014 Document in Chart   Final  . Glucose-Capillary 07/24/2014 123* 70 - 99 mg/dL Final  . Comment 1 07/24/2014 Notify RN   Final  . Comment 2 07/24/2014 Document in Chart   Final  . Glucose-Capillary 07/24/2014 185* 70 - 99 mg/dL Final  . Comment 1 07/24/2014 Notify RN   Final  . Comment 2 07/24/2014 Document in Chart   Final  . WBC  07/25/2014 10.3  4.0 - 10.5 K/uL Final  . RBC 07/25/2014 3.25* 3.87 - 5.11 MIL/uL Final  . Hemoglobin 07/25/2014 8.9* 12.0 - 15.0 g/dL Final  . HCT 07/25/2014 28.4* 36.0 - 46.0 % Final  . MCV 07/25/2014 87.4  78.0 - 100.0 fL Final  . MCH 07/25/2014 27.4  26.0 - 34.0 pg Final  . MCHC 07/25/2014 31.3  30.0 - 36.0 g/dL Final  . RDW 07/25/2014 14.4  11.5 - 15.5 % Final  . Platelets 07/25/2014 213  150 - 400 K/uL Final  . Glucose-Capillary 07/24/2014 218* 70 - 99 mg/dL Final  . Glucose-Capillary 07/25/2014 138* 70 - 99 mg/dL Final  Hospital Outpatient Visit on 07/15/2014  Component Date Value Ref Range Status  . MRSA, PCR 07/15/2014 NEGATIVE  NEGATIVE Final  . Staphylococcus aureus 07/15/2014 NEGATIVE  NEGATIVE Final   Comment:        The Xpert SA Assay (FDA approved for NASAL specimens in patients over 12 years of age), is one component of a comprehensive surveillance program.  Test performance has been validated by [facility] for patients greater than or equal to 36 year old. It is not intended to diagnose infection nor to guide or monitor treatment.   Marland Kitchen aPTT 07/15/2014 37  24 - 37 seconds Final   Comment:        IF BASELINE aPTT IS ELEVATED, SUGGEST PATIENT RISK ASSESSMENT BE USED TO DETERMINE APPROPRIATE ANTICOAGULANT THERAPY.   . WBC 07/15/2014 12.5* 4.0 - 10.5 K/uL Final  . RBC 07/15/2014 4.57  3.87 - 5.11 MIL/uL Final  . Hemoglobin 07/15/2014 12.8  12.0 - 15.0 g/dL Final  . HCT 07/15/2014 39.9  36.0 - 46.0 % Final  . MCV 07/15/2014 87.3  78.0 - 100.0 fL Final  . MCH 07/15/2014 28.0  26.0 - 34.0 pg Final  . MCHC  07/15/2014 32.1  30.0 - 36.0 g/dL Final  . RDW 07/15/2014 14.1  11.5 - 15.5 % Final  . Platelets 07/15/2014 282  150 - 400 K/uL Final  . Prothrombin Time 07/15/2014 14.3  11.6 - 15.2 seconds Final  . INR 07/15/2014 1.09  0.00 - 1.49 Final  . ABO/RH(D) 07/15/2014 A NEG   Final  . Antibody Screen 07/15/2014 POS   Final  . Sample Expiration 07/15/2014 07/21/2014   Final  . Antibody Identification 34/74/2595 ANTI D   Final  . DAT, IgG 07/15/2014 NEG   Final  . Color, Urine 07/15/2014 YELLOW  YELLOW Final  . APPearance 07/15/2014 CLEAR  CLEAR Final  . Specific Gravity, Urine 07/15/2014 1.013  1.005 - 1.030 Final  . pH 07/15/2014 5.5  5.0 - 8.0 Final  . Glucose, UA 07/15/2014 NEGATIVE  NEGATIVE mg/dL Final  . Hgb urine dipstick 07/15/2014 NEGATIVE  NEGATIVE Final  . Bilirubin Urine 07/15/2014 NEGATIVE  NEGATIVE Final  . Ketones, ur 07/15/2014 NEGATIVE  NEGATIVE mg/dL Final  . Protein, ur 07/15/2014 NEGATIVE  NEGATIVE mg/dL Final  . Urobilinogen, UA 07/15/2014 0.2  0.0 - 1.0 mg/dL Final  . Nitrite 07/15/2014 NEGATIVE  NEGATIVE Final  . Leukocytes, UA 07/15/2014 NEGATIVE  NEGATIVE Final   MICROSCOPIC NOT DONE ON URINES WITH NEGATIVE PROTEIN, BLOOD, LEUKOCYTES, NITRITE, OR GLUCOSE <1000 mg/dL.     X-Rays:No results found.  EKG: Orders placed or performed in visit on 04/10/14  . EKG 12-Lead     Hospital Course: [name] is a 81 y.o. who was admitted to [facility]. They were brought to the operating room on 07/22/2014  and underwent Procedure(s): LEFT TOTAL KNEE ARTHROPLASTY.  Patient tolerated the procedure well and was later transferred to the recovery room and then to the orthopaedic floor for postoperative care.  They were given PO and IV analgesics for pain control following their surgery.  They were given 24 hours of postoperative antibiotics of  Anti-infectives    Start     Dose/Rate Route Frequency Ordered Stop   07/22/14 2230  vancomycin (VANCOCIN) IVPB 1000 mg/200 mL  premix     1,000 mg 200 mL/hr over 60 Minutes Intravenous Every 12 hours 07/22/14 1315 07/22/14 2305   07/22/14 0730  vancomycin (VANCOCIN) IVPB 1000 mg/200 mL premix     1,000 mg 200 mL/hr over 60 Minutes Intravenous On call to O.R. 07/22/14 1607 07/22/14 1025     and started on DVT prophylaxis in the form of Xarelto.   PT and OT were ordered for total joint protocol.  Discharge planning consulted to help with postop disposition and equipment needs.  Patient had a rough night on the evening of surgery with pain and nausea.  They started to get up OOB with therapy on day one. Hemovac drain was pulled without difficulty.  Continued to work with therapy into day two.  Dressing was changed on day two and the incision was healing well but continued with nausea and vomiting on day two. Medications were changed for better control and to help with symptoms.  By day three, the patient had progressed with therapy and meeting their goals.  Incision was healing well.  Patient was seen in rounds on day three and feeling much better and was ready to go home.   Diet: Cardiac diet Activity:WBAT Follow-up:in 2 weeks Disposition - Home Discharged Condition: good       Discharge Instructions    Call MD / Call 911    Complete by:  As directed   If you experience chest pain or shortness of breath, CALL 911 and be transported to the hospital emergency room.  If you develope a fever above 101 F, pus (white drainage) or increased drainage or redness at the wound, or calf pain, call your surgeon's office.     Change dressing    Complete by:  As directed   Change dressing daily with sterile 4 x 4 inch gauze dressing and apply TED hose. Do not submerge the incision under water.     Constipation Prevention    Complete by:  As directed   Drink plenty of fluids.  Prune juice may be helpful.  You may use a stool softener, such as Colace (over the counter) 100 mg twice a day.  Use MiraLax (over the counter) for  constipation as needed.     Diet - low sodium heart healthy    Complete by:  As directed      Discharge instructions    Complete by:  As directed   Pick up stool softner and laxative for home use following surgery while on pain medications. Do not submerge incision under water. Please use good hand washing techniques while changing dressing each day. May shower starting three days after surgery. Please use a clean towel to pat the incision dry following showers. Continue to use ice for pain and swelling after surgery. Do not use any lotions or creams on the incision until instructed by your surgeon.  Take Xarelto for two and a half more weeks, then discontinue Xarelto. Once the patient has completed the Xarelto, they may resume the 81 mg  Aspirin.  Postoperative Constipation Protocol  Constipation - defined medically as fewer than three stools per week and severe constipation as less than one stool per week.  One of the most common issues patients have following surgery is constipation.  Even if you have a regular bowel pattern at home, your normal regimen is likely to be disrupted due to multiple reasons following surgery.  Combination of anesthesia, postoperative narcotics, change in appetite and fluid intake all can affect your bowels.  In order to avoid complications following surgery, here are some recommendations in order to help you during your recovery period.  Colace (docusate) - Pick up an over-the-counter form of Colace or another stool softener and take twice a day as long as you are requiring postoperative pain medications.  Take with a full glass of water daily.  If you experience loose stools or diarrhea, hold the colace until you stool forms back up.  If your symptoms do not get better within 1 week or if they get worse, check with your doctor.  Dulcolax (bisacodyl) - Pick up over-the-counter and take as directed by the product packaging as needed to assist with the movement of  your bowels.  Take with a full glass of water.  Use this product as needed if not relieved by Colace only.   MiraLax (polyethylene glycol) - Pick up over-the-counter to have on hand.  MiraLax is a solution that will increase the amount of water in your bowels to assist with bowel movements.  Take as directed and can mix with a glass of water, juice, soda, coffee, or tea.  Take if you go more than two days without a movement. Do not use MiraLax more than once per day. Call your doctor if you are still constipated or irregular after using this medication for 7 days in a row.  If you continue to have problems with postoperative constipation, please contact the office for further assistance and recommendations.  If you experience "the worst abdominal pain ever" or develop nausea or vomiting, please contact the office immediatly for further recommendations for treatment.     Do not put a pillow under the knee. Place it under the heel.    Complete by:  As directed      Do not sit on low chairs, stoools or toilet seats, as it may be difficult to get up from low surfaces    Complete by:  As directed      Driving restrictions    Complete by:  As directed   No driving until released by the physician.     Increase activity slowly as tolerated    Complete by:  As directed      Lifting restrictions    Complete by:  As directed   No lifting until released by the physician.     Patient may shower    Complete by:  As directed   You may shower without a dressing once there is no drainage.  Do not wash over the wound.  If drainage remains, do not shower until drainage stops.     TED hose    Complete by:  As directed   Use stockings (TED hose) for 3 weeks on both leg(s).  You may remove them at night for sleeping.     Weight bearing as tolerated    Complete by:  As directed   Laterality:  left  Extremity:  Lower            Medication List  STOP taking these medications        aspirin 81 MG tablet      calcitRIOL 0.25 MCG capsule  Commonly known as:  ROCALTROL     Fish Oil 1000 MG Caps      TAKE these medications        acetaminophen 500 MG tablet  Commonly known as:  TYLENOL  Take 1,000 mg by mouth every 6 (six) hours as needed for moderate pain or headache.     ALPRAZolam 0.25 MG tablet  Commonly known as:  XANAX  Take 1 tablet by mouth 2 (two) times daily as needed for anxiety.     amLODipine 10 MG tablet  Commonly known as:  NORVASC  Take 10 mg by mouth at bedtime.     furosemide 40 MG tablet  Commonly known as:  LASIX  Take 40 mg by mouth 2 (two) times daily. One tablet in the am and one at noon.     gabapentin 100 MG capsule  Commonly known as:  NEURONTIN  Take 100-200 mg by mouth 2 (two) times daily. Takes one capsule in the morning and two capsules at night.     HYDROcodone-acetaminophen 5-325 MG per tablet  Commonly known as:  NORCO/VICODIN  Take 1-2 tablets by mouth every 4 (four) hours as needed for moderate pain or severe pain.     latanoprost 0.005 % ophthalmic solution  Commonly known as:  XALATAN  Place 1 drop into both eyes at bedtime.     methocarbamol 500 MG tablet  Commonly known as:  ROBAXIN  Take 1 tablet (500 mg total) by mouth every 6 (six) hours as needed for muscle spasms.     NOVOLIN N 100 UNIT/ML injection  Generic drug:  insulin NPH Human  Inject 30 Units into the skin every morning.     promethazine 12.5 MG tablet  Commonly known as:  PHENERGAN  Take 1 tablet (12.5 mg total) by mouth every 6 (six) hours as needed for nausea or vomiting.     rivaroxaban 10 MG Tabs tablet  Commonly known as:  XARELTO  - Take 1 tablet (10 mg total) by mouth daily with breakfast. Take Xarelto for two and a half more weeks, then discontinue Xarelto.  - Once the patient has completed the Xarelto, they may resume the 81 mg Aspirin.     traMADol 50 MG tablet  Commonly known as:  ULTRAM  Take 1-2 tablets (50-100 mg total) by mouth every 6 (six) hours  as needed (mild pain).     valsartan 320 MG tablet  Commonly known as:  DIOVAN  Take 1 tablet (320 mg total) by mouth daily.       Follow-up Information    Follow up with [facility].   Why:  home health physical therapy   Contact information:   [address] [place] [address] [phone]       Follow up with Gearlean Alf, MD. Schedule an appointment as soon as possible for a visit in 2 weeks.   Specialty:  Orthopedic Surgery   Why:  Call office at [phone] to setup appointment with Dr. Wynelle Link.   Contact information:   [address] [place] [address] [phone]       Signed: Arlee Muslim, PA-C Orthopaedic Surgery 08/15/2014, 8:51 AM

## 2014-07-23 NOTE — Care Management Note (Signed)
    Page 1 of 1   07/23/2014     2:51:25 PM CARE MANAGEMENT NOTE 07/23/2014  Patient:  [name], [name]   Account Number:  [id number]  Date Initiated:  07/23/2014  Documentation initiated by:  [facility]  Subjective/Objective Assessment:   adm: LEFT TOTAL KNEE ARTHROPLASTY (Left)     Action/Plan:   discharge planning   Anticipated DC Date:  07/24/2014   Anticipated DC Plan:  [place]  CM consult      [facility] Choice  HOME HEALTH   Choice offered to / List presented to:  C-1 Patient        [place] arranged  HH-2 PT      [place]   Status of service:  Completed, signed off Medicare Important Message given?   (If response is "NO", the following Medicare IM given date fields will be blank) Date Medicare IM given:   Medicare IM given by:   Date Additional Medicare IM given:   Additional Medicare IM given by:    Discharge Disposition:  [place]  Per UR Regulation:    If discussed at Long Length of Stay Meetings, dates discussed:    Comments:  07/22/12:40 CM met with pt in room to offer home health agency.Pt chooses [facility] to render HHPT.  Pt has both a rolling walker and 3n1 at home.  Address and contact information verified by pt.  Referral emailed to [employer], Tim.  NO other CM needs werecommunicated.  Mariane Masters, BSN, [place].

## 2014-07-23 NOTE — Progress Notes (Signed)
   Subjective: 1 Day Post-Op Procedure(s) (LRB): LEFT TOTAL KNEE ARTHROPLASTY (Left) Patient reports pain as moderate.   Patient seen in rounds with Dr. Wynelle Link.  Patient had a rough night following surgery.  Nausea and vomiting last night but better this morning. Patient is well, but has had some minor complaints of pain in the knee, requiring pain medications We will start therapy today.  Plan is to go Home after hospital stay.  Objective: Vital signs in last 24 hours: Temp:  [97.4 F (36.3 C)-98.4 F (36.9 C)] 98.4 F (36.9 C) (04/26 0553) Pulse Rate:  [69-80] 74 (04/26 0553) Resp:  [12-17] 16 (04/26 0800) BP: (132-186)/(40-56) 136/45 mmHg (04/26 0553) SpO2:  [96 %-100 %] 96 % (04/26 0800) Weight:  [63.957 kg (141 lb)] 63.957 kg (141 lb) (04/25 1306)  Intake/Output from previous day:  Intake/Output Summary (Last 24 hours) at 07/23/14 0900 Last data filed at 07/23/14 0700  Gross per 24 hour  Intake   3320 ml  Output   2330 ml  Net    990 ml    Intake/Output this shift: UOP 800 since around [place]  Labs:  Recent Labs  07/23/14 0526  HGB 9.9*    Recent Labs  07/23/14 0526  WBC 10.8*  RBC 3.48*  HCT 30.1*  PLT 224    Recent Labs  07/23/14 0526  NA 134*  K 4.5  CL 105  CO2 23  BUN 25*  CREATININE 1.15*  GLUCOSE 257*  CALCIUM 8.3*   No results for input(s): LABPT, INR in the last 72 hours.  EXAM General - Patient is Alert, Appropriate and Oriented Extremity - Neurovascular intact Sensation intact distally Dorsiflexion/Plantar flexion intact Dressing - dressing C/D/I Motor Function - intact, moving foot and toes well on exam.  Hemovac pulled without difficulty.  Past Medical History  Diagnosis Date  . Osteoarthritis   . Mild mitral regurgitation   . Diabetes mellitus     type 2 w complications of proteinuria and retinopathy  . Hypercholesterolemia   . Breast cancer 1991    right  . CKD (chronic kidney disease), stage III     from [facility]-Dr.  Deterding  . Carotid artery stenosis 08/2011    nonobstructive 50-70% by doppler  . RLS (restless legs syndrome)     /insomnia-hydroxizine works well..  . Hypertension   . Complication of anesthesia   . PONV (postoperative nausea and vomiting)   . Asthma     seasonal  . Pernicious anemia   . Difficulty sleeping     due to husbands recent death  . Neuropathy     Assessment/Plan: 1 Day Post-Op Procedure(s) (LRB): LEFT TOTAL KNEE ARTHROPLASTY (Left) Principal Problem:   OA (osteoarthritis) of knee  Estimated body mass index is 23.46 kg/(m^2) as calculated from the following:   Height as of this encounter: 5\' 5"  (1.651 m).   Weight as of this encounter: 63.957 kg (141 lb). Advance diet Up with therapy Plan for discharge tomorrow Discharge home with home health  DVT Prophylaxis - Xarelto Weight-Bearing as tolerated to left leg D/C O2 and Pulse OX and try on Room Air  Arlee Muslim, PA-C Orthopaedic Surgery 07/23/2014, 9:00 AM

## 2014-07-23 NOTE — Discharge Instructions (Addendum)
Dr. Gaynelle Arabian Total Joint Specialist [facility] [address] [place], [place] [address] [phone]  TOTAL KNEE REPLACEMENT POSTOPERATIVE DIRECTIONS  Knee Rehabilitation, Guidelines Following Surgery  Results after knee surgery are often greatly improved when you follow the exercise, range of motion and muscle strengthening exercises prescribed by your doctor. Safety measures are also important to protect the knee from further injury. Any time any of these exercises cause you to have increased pain or swelling in your knee joint, decrease the amount until you are comfortable again and slowly increase them. If you have problems or questions, call your caregiver or physical therapist for advice.   HOME CARE INSTRUCTIONS  Remove items at home which could result in a fall. This includes throw rugs or furniture in walking pathways.   ICE to the affected knee every three hours for 30 minutes at a time and then as needed for pain and swelling.  Continue to use ice on the knee for pain and swelling from surgery. You may notice swelling that will progress down to the foot and ankle.  This is normal after surgery.  Elevate the leg when you are not up walking on it.    Continue to use the breathing machine which will help keep your temperature down.  It is common for your temperature to cycle up and down following surgery, especially at night when you are not up moving around and exerting yourself.  The breathing machine keeps your lungs expanded and your temperature down.  Do not place pillow under knee, focus on keeping the knee straight while resting  DIET You may resume your previous home diet once your are discharged from the hospital.  DRESSING / WOUND CARE / SHOWERING You may change your dressing 3-5 days after surgery.  Then change the dressing every day with sterile gauze.  Please use good hand washing techniques before changing the dressing.  Do not use any  lotions or creams on the incision until instructed by your surgeon. You may start showering once you are discharged home but do not submerge the incision under water. Just pat the incision dry and apply a dry gauze dressing on daily. Change the surgical dressing daily and reapply a dry dressing each time.  ACTIVITY Walk with your walker as instructed. Use walker as long as suggested by your caregivers. Avoid periods of inactivity such as sitting longer than an hour when not asleep. This helps prevent blood clots.  You may resume a sexual relationship in one month or when given the OK by your doctor.  You may return to work once you are cleared by your doctor.  Do not drive a car for 6 weeks or until released by you surgeon.  Do not drive while taking narcotics.  WEIGHT BEARING Weight bearing as tolerated with assist device (walker, cane, etc) as directed, use it as long as suggested by your surgeon or therapist, typically at least 4-6 weeks.  POSTOPERATIVE CONSTIPATION PROTOCOL Constipation - defined medically as fewer than three stools per week and severe constipation as less than one stool per week.  One of the most common issues patients have following surgery is constipation.  Even if you have a regular bowel pattern at home, your normal regimen is likely to be disrupted due to multiple reasons following surgery.  Combination of anesthesia, postoperative narcotics, change in appetite and fluid intake all can affect your bowels.  In order to avoid complications following surgery, here are some recommendations  in order to help you during your recovery period.  Colace (docusate) - Pick up an over-the-counter form of Colace or another stool softener and take twice a day as long as you are requiring postoperative pain medications.  Take with a full glass of water daily.  If you experience loose stools or diarrhea, hold the colace until you stool forms back up.  If your symptoms do not get better  within 1 week or if they get worse, check with your doctor.  Dulcolax (bisacodyl) - Pick up over-the-counter and take as directed by the product packaging as needed to assist with the movement of your bowels.  Take with a full glass of water.  Use this product as needed if not relieved by Colace only.   MiraLax (polyethylene glycol) - Pick up over-the-counter to have on hand.  MiraLax is a solution that will increase the amount of water in your bowels to assist with bowel movements.  Take as directed and can mix with a glass of water, juice, soda, coffee, or tea.  Take if you go more than two days without a movement. Do not use MiraLax more than once per day. Call your doctor if you are still constipated or irregular after using this medication for 7 days in a row.  If you continue to have problems with postoperative constipation, please contact the office for further assistance and recommendations.  If you experience "the worst abdominal pain ever" or develop nausea or vomiting, please contact the office immediatly for further recommendations for treatment.  ITCHING  If you experience itching with your medications, try taking only a single pain pill, or even half a pain pill at a time.  You can also use Benadryl over the counter for itching or also to help with sleep.   TED HOSE STOCKINGS Wear the elastic stockings on both legs for three weeks following surgery during the day but you may remove then at night for sleeping.  MEDICATIONS See your medication summary on the After Visit Summary that the nursing staff will review with you prior to discharge.  You may have some home medications which will be placed on hold until you complete the course of blood thinner medication.  It is important for you to complete the blood thinner medication as prescribed by your surgeon.  Continue your approved medications as instructed at time of discharge.  PRECAUTIONS If you experience chest pain or shortness  of breath - call 911 immediately for transfer to the hospital emergency department.  If you develop a fever greater that 101 F, purulent drainage from wound, increased redness or drainage from wound, foul odor from the wound/dressing, or calf pain - CONTACT YOUR SURGEON.                                                   FOLLOW-UP APPOINTMENTS Make sure you keep all of your appointments after your operation with your surgeon and caregivers. You should call the office at the above phone number and make an appointment for approximately two weeks after the date of your surgery or on the date instructed by your surgeon outlined in the "After Visit Summary".   RANGE OF MOTION AND STRENGTHENING EXERCISES  Rehabilitation of the knee is important following a knee injury or an operation. After just a few days of immobilization, the muscles  of the thigh which control the knee become weakened and shrink (atrophy). Knee exercises are designed to build up the tone and strength of the thigh muscles and to improve knee motion. Often times heat used for twenty to thirty minutes before working out will loosen up your tissues and help with improving the range of motion but do not use heat for the first two weeks following surgery. These exercises can be done on a training (exercise) mat, on the floor, on a table or on a bed. Use what ever works the best and is most comfortable for you Knee exercises include:  Leg Lifts - While your knee is still immobilized in a splint or cast, you can do straight leg raises. Lift the leg to 60 degrees, hold for 3 sec, and slowly lower the leg. Repeat 10-20 times 2-3 times daily. Perform this exercise against resistance later as your knee gets better.  Quad and Hamstring Sets - Tighten up the muscle on the front of the thigh (Quad) and hold for 5-10 sec. Repeat this 10-20 times hourly. Hamstring sets are done by pushing the foot backward against an object and holding for 5-10 sec. Repeat as  with quad sets.   Leg Slides: Lying on your back, slowly slide your foot toward your buttocks, bending your knee up off the floor (only go as far as is comfortable). Then slowly slide your foot back down until your leg is flat on the floor again.  Angel Wings: Lying on your back spread your legs to the side as far apart as you can without causing discomfort.  A rehabilitation program following serious knee injuries can speed recovery and prevent re-injury in the future due to weakened muscles. Contact your doctor or a physical therapist for more information on knee rehabilitation.   IF YOU ARE TRANSFERRED TO A SKILLED REHAB FACILITY If the patient is transferred to a skilled rehab facility following release from the hospital, a list of the current medications will be sent to the facility for the patient to continue.  When discharged from the skilled rehab facility, please have the facility set up the patient's [place] prior to being released. Also, the skilled facility will be responsible for providing the patient with their medications at time of release from the facility to include their pain medication, the muscle relaxants, and their blood thinner medication. If the patient is still at the rehab facility at time of the two week follow up appointment, the skilled rehab facility will also need to assist the patient in arranging follow up appointment in our office and any transportation needs.  MAKE SURE YOU:  Understand these instructions.  Get help right away if you are not doing well or get worse.    Pick up stool softner and laxative for home use following surgery while on pain medications. Do not submerge incision under water. Please use good hand washing techniques while changing dressing each day. May shower starting three days after surgery. Please use a clean towel to pat the incision dry following showers. Continue to use ice for pain and swelling after  surgery. Do not use any lotions or creams on the incision until instructed by your surgeon.  Take Xarelto for two and a half more weeks, then discontinue Xarelto. Once the patient has completed the Xarelto, they may resume the 81 mg Aspirin.  Information on my medicine - XARELTO (Rivaroxaban)  This medication education was reviewed with me or my healthcare representative as  part of my discharge preparation.  The pharmacist that spoke with me during my hospital stay was:  Minda Ditto, [facility]  Why was Xarelto prescribed for you? Xarelto was prescribed for you to reduce the risk of blood clots forming after orthopedic surgery. The medical term for these abnormal blood clots is venous thromboembolism (VTE).  What do you need to know about xarelto ? Take your Xarelto ONCE DAILY at the same time every day. You may take it either with or without food.  If you have difficulty swallowing the tablet whole, you may crush it and mix in applesauce just prior to taking your dose.  Take Xarelto exactly as prescribed by your doctor and DO NOT stop taking Xarelto without talking to the doctor who prescribed the medication.  Stopping without other VTE prevention medication to take the place of Xarelto may increase your risk of developing a clot.  After discharge, you should have regular check-up appointments with your healthcare provider that is prescribing your Xarelto.    What do you do if you miss a dose? If you miss a dose, take it as soon as you remember on the same day then continue your regularly scheduled once daily regimen the next day. Do not take two doses of Xarelto on the same day.   Important Safety Information A possible side effect of Xarelto is bleeding. You should call your healthcare provider right away if you experience any of the following: ? Bleeding from an injury or your nose that does not stop. ? Unusual colored urine (red or dark brown) or unusual colored stools (red  or black). ? Unusual bruising for unknown reasons. ? A serious fall or if you hit your head (even if there is no bleeding).  Some medicines may interact with Xarelto and might increase your risk of bleeding while on Xarelto. To help avoid this, consult your healthcare provider or pharmacist prior to using any new prescription or non-prescription medications, including herbals, vitamins, non-steroidal anti-inflammatory drugs (NSAIDs) and supplements.  We discussed holding Aspirin while on Xarelto  This website has more information on Xarelto: [email or web address]

## 2014-07-23 NOTE — Progress Notes (Signed)
Physical Therapy Treatment Patient Details Name: [name] MRN: [id number] DOB: November 16, 1926 Today's Date: 07/23/2014    History of Present Illness L tka    PT Comments    PATIENT TOLERATED AMBULATION THIS PM, min c/o pain.   Follow Up Recommendations  Home health PT;Supervision/Assistance - 24 hour     Equipment Recommendations  None recommended by PT    Recommendations for Other Services       Precautions / Restrictions Precautions Precautions: Fall;Knee Knee Immobilizer - Left: Discontinue once straight leg raise with < 10 degree lag Restrictions Weight Bearing Restrictions: No    Mobility  Bed Mobility Overal bed mobility: Needs Assistance Bed Mobility: Sit to Supine     Supine to sit: Min assist Sit to supine: Min assist   General bed mobility comments: assist for LLE  Transfers Overall transfer level: Needs assistance Equipment used: Rolling walker (2 wheeled) Transfers: Sit to/from Stand Sit to Stand: Min guard         General transfer comment: cues for hand placement when sitting down  Ambulation/Gait Ambulation/Gait assistance: Min assist Ambulation Distance (Feet): 90 Feet Assistive device: Rolling walker (2 wheeled) Gait Pattern/deviations: Step-to pattern;Step-through pattern     General Gait Details: cues for sequence and for posture   Stairs            Wheelchair Mobility    Modified Rankin (Stroke Patients Only)       Balance                                    Cognition Arousal/Alertness: Awake/alert Behavior During Therapy: WFL for tasks assessed/performed Overall Cognitive Status: Within Functional Limits for tasks assessed                      Exercises T  General Comments        Pertinent Vitals/Pain Pain Assessment: 0-10 Pain Score: 2  Pain Location: L knee Pain Descriptors / Indicators: Aching;Discomfort Pain Intervention(s): Limited activity within patient's  tolerance;Premedicated before session;Repositioned;Ice applied    Home Living Family/patient expects to be discharged to:: Private residence Living Arrangements: Alone Available Help at Discharge: Family;Friend(s);Available 24 hours/day         Home Equipment: Bedside commode;Shower seat      Prior Function Level of Independence: Independent          PT Goals (current goals can now be found in the care plan section) Acute Rehab PT Goals Patient Stated Goal: to go home, mow my grass. PT Goal Formulation: With patient/family Time For Goal Achievement: 07/30/14 Potential to Achieve Goals: Good Progress towards PT goals: Progressing toward goals    Frequency  7X/week    PT Plan Current plan remains appropriate    Co-evaluation             End of Session   Activity Tolerance: Patient tolerated treatment well Patient left: in bed;with call bell/phone within reach     Time: 1521-1535 PT Time Calculation (min) (ACUTE ONLY): 14 min  Charges:  $Gait Training: 8-22 mins $Therapeutic Exercise: 8-22 mins                    G Codes:      [name] 07/23/2014, 4:31 PM

## 2014-07-23 NOTE — Plan of Care (Signed)
Problem: Consults Goal: Diagnosis- Total Joint Replacement Outcome: Completed/Met Date Met:  07/23/14 Primary Total Knee LEFT     

## 2014-07-23 NOTE — Evaluation (Signed)
Occupational Therapy Evaluation Patient Details Name: [name] MRN: [id number] DOB: 1926-06-04 Today's Date: 07/23/2014    History of Present Illness L tka   Clinical Impression   This 81 year old female was admitted for the above surgery.  At baseline, she is independent with adls.  Pt currently needs min A to min guard overall and will have 24/7 assistance at home. Will follow in acute with supervision level goals.     Follow Up Recommendations  Supervision/Assistance - 24 hour    Equipment Recommendations  None recommended by OT    Recommendations for Other Services       Precautions / Restrictions Precautions Precautions: Fall;Knee Required Braces or Orthoses: Knee Immobilizer - Left Knee Immobilizer - Left: Discontinue once straight leg raise with < 10 degree lag Restrictions Weight Bearing Restrictions: No      Mobility Bed Mobility Overal bed mobility: Needs Assistance Bed Mobility: Supine to Sit     Supine to sit: Min assist    General bed mobility comments: assist for LLE  Transfers Overall transfer level: Needs assistance Equipment used: Rolling walker (2 wheeled) Transfers: Sit to/from Stand Sit to Stand: Min guard         General transfer comment: cues for hand placement when sitting down    Balance                                            ADL Overall ADL's : Needs assistance/impaired             Lower Body Bathing: Minimal assistance;Sit to/from stand       Lower Body Dressing: Minimal assistance;Sit to/from stand   Toilet Transfer: Minimal guard;Stand-pivot (recliner)             General ADL Comments: pt had catheter in during OT eval.  Got up to recliner but did not practice 3;1 commode transfer:  pt verbalizes understanding.  Pt has a reacher at home: explained ADL uses.  She will have 24/7 assistance at home from different family members.  Pt had her other knee done 12 years ago and used shower  seat in tub without stepping over.  Recommended that she do a "dry run" with [place] to make sure this is safe     Vision     Perception     Praxis      Pertinent Vitals/Pain Pain Assessment: 0-10 Pain Score: 2  Pain Location: left knee Pain Descriptors / Indicators: Aching Pain Intervention(s): Limited activity within patient's tolerance;Monitored during session;Premedicated before session;Repositioned;Ice applied     Hand Dominance     Extremity/Trunk Assessment Upper Extremity Assessment Upper Extremity Assessment: Overall WFL for tasks assessed      Cervical / Trunk Assessment Cervical / Trunk Assessment: Normal   Communication Communication Communication: No difficulties   Cognition Arousal/Alertness: Awake/alert Behavior During Therapy: WFL for tasks assessed/performed Overall Cognitive Status: Within Functional Limits for tasks assessed                     General Comments       Exercises       Shoulder Instructions      Home Living Family/patient expects to be discharged to:: Private residence Living Arrangements: Alone Available Help at Discharge: Family;Friend(s);Available 24 hours/day Type of Home: House Home Access: Stairs to enter [employer] of Steps: 2 Entrance  Stairs-Rails: Right Home Layout: One level     Bathroom Shower/Tub: Tub/shower unit Shower/tub characteristics: Curtain Biochemist, clinical: Handicapped height     Home Equipment: Bedside commode;Shower seat          Prior Functioning/Environment Level of Independence: Independent             OT Diagnosis: Acute pain;Generalized weakness   OT Problem List: Decreased strength;Decreased activity tolerance;Decreased knowledge of use of DME or AE;Pain   OT Treatment/Interventions: Self-care/ADL training;DME and/or AE instruction;Patient/family education    OT Goals(Current goals can be found in the care plan section) Acute Rehab OT Goals Patient Stated Goal:  to go home, mow my grass. OT Goal Formulation: With patient Time For Goal Achievement: 07/30/14 Potential to Achieve Goals: Good ADL Goals Pt Will Perform Grooming: with supervision;standing Pt Will Transfer to Toilet: with supervision;ambulating;bedside commode Pt Will Perform Toileting - Clothing Manipulation and hygiene: with supervision;sit to/from stand  OT Frequency: Min 2X/week   Barriers to D/C:            Co-evaluation              End of Session    Activity Tolerance: Patient tolerated treatment well Patient left: in chair;with call bell/phone within reach;with family/visitor present   Time: 4665-9935 OT Time Calculation (min): 21 min Charges:  OT General Charges $OT Visit: 1 Procedure OT Evaluation $Initial OT Evaluation Tier I: 1 Procedure G-Codes:    Tysen Roesler 08-19-2014, 2:37 PM Lesle Chris, OTR/L [phone] 19-Aug-2014

## 2014-07-24 LAB — CBC
HCT: 29.8 % — ABNORMAL LOW (ref 36.0–46.0)
Hemoglobin: 9.8 g/dL — ABNORMAL LOW (ref 12.0–15.0)
MCH: 28.6 pg (ref 26.0–34.0)
MCHC: 32.9 g/dL (ref 30.0–36.0)
MCV: 86.9 fL (ref 78.0–100.0)
Platelets: 234 10*3/uL (ref 150–400)
RBC: 3.43 MIL/uL — ABNORMAL LOW (ref 3.87–5.11)
RDW: 14.4 % (ref 11.5–15.5)
WBC: 13.5 10*3/uL — AB (ref 4.0–10.5)

## 2014-07-24 LAB — BASIC METABOLIC PANEL
Anion gap: 8 (ref 5–15)
BUN: 29 mg/dL — ABNORMAL HIGH (ref 6–23)
CALCIUM: 8.8 mg/dL (ref 8.4–10.5)
CO2: 28 mmol/L (ref 19–32)
CREATININE: 1.1 mg/dL (ref 0.50–1.10)
Chloride: 103 mmol/L (ref 96–112)
GFR calc Af Amer: 52 mL/min — ABNORMAL LOW (ref >90)
GFR calc non Af Amer: 44 mL/min — ABNORMAL LOW (ref >90)
GLUCOSE: 176 mg/dL — AB (ref 70–99)
Potassium: 4.1 mmol/L (ref 3.5–5.1)
Sodium: 139 mmol/L (ref 135–145)

## 2014-07-24 LAB — GLUCOSE, CAPILLARY
Glucose-Capillary: 139 mg/dL — ABNORMAL HIGH (ref 70–99)
Glucose-Capillary: 123 mg/dL — ABNORMAL HIGH (ref 70–99)
Glucose-Capillary: 185 mg/dL — ABNORMAL HIGH (ref 70–99)
Glucose-Capillary: 218 mg/dL — ABNORMAL HIGH (ref 70–99)

## 2014-07-24 MED ORDER — PROMETHAZINE HCL 25 MG/ML IJ SOLN: 6.2500 mg | Freq: Four times a day (QID) | INTRAMUSCULAR | Status: DC | PRN

## 2014-07-24 MED ORDER — PROMETHAZINE HCL 25 MG PO TABS
12.5000 mg | ORAL_TABLET | Freq: Four times a day (QID) | ORAL | Status: DC | PRN
  Filled 2014-07-24: qty 1

## 2014-07-24 MED ORDER — HYDROCODONE-ACETAMINOPHEN 5-325 MG PO TABS
1.0000 | ORAL_TABLET | ORAL | Status: DC | PRN
  Administered 2014-07-24 – 2014-07-25 (×4): 1 via ORAL
  Filled 2014-07-24 (×4): qty 1

## 2014-07-24 MED ORDER — PROMETHAZINE HCL 25 MG RE SUPP: 25.0000 mg | Freq: Four times a day (QID) | RECTAL | Status: DC | PRN

## 2014-07-24 NOTE — Progress Notes (Signed)
   Subjective: 2 Days Post-Op Procedure(s) (LRB): LEFT TOTAL KNEE ARTHROPLASTY (Left) Patient reports pain as mild and moderate.   Patient seen in rounds for Dr. Wynelle Link.  She has had a lot of nausea and some vomiting today.  Will change meds. Reduce the pain pill and add Phenergan for nausea control. Patient is having problems with nausea/vomiting and pain in the knee, requiring pain medications Plan is to go Home after hospital stay.  Objective: Vital signs in last 24 hours: Temp:  [97.8 F (36.6 C)-98.3 F (36.8 C)] 97.8 F (36.6 C) (04/27 0436) Pulse Rate:  [67-80] 70 (04/27 0755) Resp:  [14-16] 16 (04/27 0436) BP: (142-161)/(42-83) 161/65 mmHg (04/27 0755) SpO2:  [94 %-95 %] 94 % (04/27 0436)  Intake/Output from previous day:  Intake/Output Summary (Last 24 hours) at 07/24/14 1129 Last data filed at 07/24/14 0927  Gross per 24 hour  Intake    480 ml  Output   3000 ml  Net  -2520 ml    Intake/Output this shift: Total I/O In: 240 [P.O.:240] Out: 600 [Urine:600]  Labs:  Recent Labs  07/23/14 0526 07/24/14 0428  HGB 9.9* 9.8*    Recent Labs  07/23/14 0526 07/24/14 0428  WBC 10.8* 13.5*  RBC 3.48* 3.43*  HCT 30.1* 29.8*  PLT 224 234    Recent Labs  07/23/14 0526 07/24/14 0428  NA 134* 139  K 4.5 4.1  CL 105 103  CO2 23 28  BUN 25* 29*  CREATININE 1.15* 1.10  GLUCOSE 257* 176*  CALCIUM 8.3* 8.8   No results for input(s): LABPT, INR in the last 72 hours.  EXAM General - Patient is Alert and Appropriate Extremity - Neurovascular intact Sensation intact distally Dorsiflexion/Plantar flexion intact Dressing/Incision - clean, dry, no drainage Motor Function - intact, moving foot and toes well on exam.   Past Medical History  Diagnosis Date  . Osteoarthritis   . Mild mitral regurgitation   . Diabetes mellitus     type 2 w complications of proteinuria and retinopathy  . Hypercholesterolemia   . Breast cancer 1991    right  . CKD (chronic  kidney disease), stage III     from [facility]-Dr. Deterding  . Carotid artery stenosis 08/2011    nonobstructive 50-70% by doppler  . RLS (restless legs syndrome)     /insomnia-hydroxizine works well..  . Hypertension   . Complication of anesthesia   . PONV (postoperative nausea and vomiting)   . Asthma     seasonal  . Pernicious anemia   . Difficulty sleeping     due to husbands recent death  . Neuropathy     Assessment/Plan: 2 Days Post-Op Procedure(s) (LRB): LEFT TOTAL KNEE ARTHROPLASTY (Left) Principal Problem:   OA (osteoarthritis) of knee  Estimated body mass index is 23.46 kg/(m^2) as calculated from the following:   Height as of this encounter: 5\' 5"  (1.651 m).   Weight as of this encounter: 63.957 kg (141 lb). Up with therapy Plan for discharge tomorrow Discharge home with home health  DVT Prophylaxis - Xarelto Weight-Bearing as tolerated to left leg Change medications and add Phenergan for nausea.  Arlee Muslim, PA-C Orthopaedic Surgery 07/24/2014, 11:29 AM

## 2014-07-24 NOTE — Progress Notes (Signed)
Physical Therapy Treatment Patient Details Name: [name] MRN: [id number] DOB: 1927-02-20 Today's Date: 07/24/2014    History of Present Illness L tka    PT Comments    Patient feeling better. Plans Dc tomorrow.  Follow Up Recommendations  Home health PT;Supervision/Assistance - 24 hour     Equipment Recommendations  None recommended by PT    Recommendations for Other Services       Precautions / Restrictions Precautions Precautions: Fall;Knee Required Braces or Orthoses: Knee Immobilizer - Left Knee Immobilizer - Left: Discontinue once straight leg raise with < 10 degree lag    Mobility  Bed Mobility   Bed Mobility: Supine to Sit     Supine to sit: Min guard     General bed mobility comments: NT  too nauseated  Transfers   Equipment used: Rolling walker (2 wheeled) Transfers: Sit to/from Stand Sit to Stand: Supervision            Ambulation/Gait Ambulation/Gait assistance: Min guard Ambulation Distance (Feet): 90 Feet Assistive device: Rolling walker (2 wheeled) Gait Pattern/deviations: Step-to pattern;Antalgic     General Gait Details: cues for sequence and for posture   Stairs            Wheelchair Mobility    Modified Rankin (Stroke Patients Only)       Balance                                    Cognition Arousal/Alertness: Awake/alert Behavior During Therapy: Anxious                        Exercises Total Joint Exercises Ankle Circles/Pumps: AROM;Both;10 reps;Supine Quad Sets: AROM;Both;10 reps;Supine Towel Squeeze: AROM;Both;10 reps;Supine Short Arc Quad: AAROM;Left;Supine;15 reps Heel Slides: AAROM;Left;10 reps;Supine Hip ABduction/ADduction: AAROM;Left;10 reps;Supine Straight Leg Raises: AAROM;Left;10 reps;Supine Goniometric ROM: 5-50 L knee    General Comments        Pertinent Vitals/Pain Pain Score: 4  Pain Location: L knee Pain Descriptors / Indicators: Aching Pain  Intervention(s): Limited activity within patient's tolerance;Monitored during session;Premedicated before session;Ice applied    Home Living                      Prior Function            PT Goals (current goals can now be found in the care plan section) Progress towards PT goals: Progressing toward goals    Frequency  7X/week    PT Plan Current plan remains appropriate    Co-evaluation             End of Session   Activity Tolerance: Patient tolerated treatment well Patient left: in chair;with call bell/phone within reach     Time: 1405-1420 PT Time Calculation (min) (ACUTE ONLY): 15 min  Charges:  $Gait Training: 8-22 mins $Therapeutic Exercise: 23-37 mins                    G Codes:      Claretha Cooper 07/24/2014, 2:42 PM

## 2014-07-24 NOTE — Progress Notes (Signed)
Physical Therapy Treatment Patient Details Name: [name] MRN: [id number] DOB: [birth year] Today's Date: 07/24/2014    History of Present Illness L tka    PT Comments    Patient has been nauseated today. Able to perform there exercises, then became more nauseated. Will  Attempt ambulation later if feels better.  Follow Up Recommendations  Home health PT;Supervision/Assistance - 24 hour     Equipment Recommendations  None recommended by PT    Recommendations for Other Services       Precautions / Restrictions Precautions Precautions: Fall;Knee Required Braces or Orthoses: Knee Immobilizer - Left Knee Immobilizer - Left: Discontinue once straight leg raise with < 10 degree lag Restrictions Weight Bearing Restrictions: No    Mobility  Bed Mobility               General bed mobility comments: NT  too nauseated  Transfers                    Ambulation/Gait                 Stairs            Wheelchair Mobility    Modified Rankin (Stroke Patients Only)       Balance                                    Cognition Arousal/Alertness: Awake/alert Behavior During Therapy: Anxious                        Exercises Total Joint Exercises Ankle Circles/Pumps: AROM;Both;10 reps;Supine Quad Sets: AROM;Both;10 reps;Supine Towel Squeeze: AROM;Both;10 reps;Supine Short Arc Quad: AAROM;Left;Supine;15 reps Heel Slides: AAROM;Left;10 reps;Supine Hip ABduction/ADduction: AAROM;Left;10 reps;Supine Straight Leg Raises: AAROM;Left;10 reps;Supine Goniometric ROM: 5-50 L knee    General Comments        Pertinent Vitals/Pain Pain Score: 5  Pain Location: L knee, abdomen Pain Descriptors / Indicators: Aching Pain Intervention(s): Limited activity within patient's tolerance;Monitored during session;Premedicated before session;Ice applied    Home Living                      Prior Function            PT  Goals (current goals can now be found in the care plan section) Progress towards PT goals: Progressing toward goals    Frequency  7X/week    PT Plan Current plan remains appropriate    Co-evaluation             End of Session   Activity Tolerance: Treatment limited secondary to medical complications (Comment) (nausea) Patient left: in bed;with call bell/phone within reach     Time: 1040-1110 PT Time Calculation (min) (ACUTE ONLY): 30 min  Charges:  $Therapeutic Exercise: 23-37 mins                    G Codes:      Claretha Cooper 07/24/2014, 11:29 AM Tresa Endo PT [phone]

## 2014-07-24 NOTE — Plan of Care (Signed)
Problem: Phase III Progression Outcomes Goal: Anticoagulant follow-up in place Outcome: Not Applicable Date Met:  25/74/93 Xarelto VTE, no f/u needed.

## 2014-07-25 LAB — CBC
HCT: 28.4 % — ABNORMAL LOW (ref 36.0–46.0)
HEMOGLOBIN: 8.9 g/dL — AB (ref 12.0–15.0)
MCH: 27.4 pg (ref 26.0–34.0)
MCHC: 31.3 g/dL (ref 30.0–36.0)
MCV: 87.4 fL (ref 78.0–100.0)
Platelets: 213 10*3/uL (ref 150–400)
RBC: 3.25 MIL/uL — AB (ref 3.87–5.11)
RDW: 14.4 % (ref 11.5–15.5)
WBC: 10.3 10*3/uL (ref 4.0–10.5)

## 2014-07-25 LAB — GLUCOSE, CAPILLARY: Glucose-Capillary: 138 mg/dL — ABNORMAL HIGH (ref 70–99)

## 2014-07-25 MED ORDER — DOCUSATE SODIUM 100 MG PO CAPS: 100.0000 mg | ORAL_CAPSULE | Freq: Two times a day (BID) | ORAL | Status: DC

## 2014-07-25 MED ORDER — HYDROCODONE-ACETAMINOPHEN 5-325 MG PO TABS: 1.0000 | ORAL_TABLET | ORAL | Status: AC | PRN

## 2014-07-25 MED ORDER — PROMETHAZINE HCL 12.5 MG PO TABS: 12.5000 mg | ORAL_TABLET | Freq: Four times a day (QID) | ORAL | Status: AC | PRN

## 2014-07-25 MED ORDER — RIVAROXABAN 10 MG PO TABS
10.0000 mg | ORAL_TABLET | Freq: Every day | ORAL | Status: AC
Start: 2014-07-25 — End: ?

## 2014-07-25 MED ORDER — ONDANSETRON HCL 4 MG PO TABS: 4.0000 mg | ORAL_TABLET | Freq: Four times a day (QID) | ORAL | Status: DC | PRN

## 2014-07-25 MED ORDER — TRAMADOL HCL 50 MG PO TABS: 50.0000 mg | ORAL_TABLET | Freq: Four times a day (QID) | ORAL | Status: AC | PRN

## 2014-07-25 MED ORDER — PROMETHAZINE HCL 12.5 MG PO TABS: 12.5000 mg | ORAL_TABLET | Freq: Four times a day (QID) | ORAL | Status: DC | PRN

## 2014-07-25 MED ORDER — METHOCARBAMOL 500 MG PO TABS: 500.0000 mg | ORAL_TABLET | Freq: Four times a day (QID) | ORAL | Status: AC | PRN

## 2014-07-25 MED ORDER — BISACODYL 10 MG RE SUPP: 10.0000 mg | Freq: Every day | RECTAL | Status: DC | PRN

## 2014-07-25 MED ORDER — POLYETHYLENE GLYCOL 3350 17 G PO PACK: 17.0000 g | PACK | Freq: Every day | ORAL | Status: DC | PRN

## 2014-07-25 NOTE — Progress Notes (Signed)
Occupational Therapy Treatment and Discharge Patient Details Name: [name] MRN: [id number] DOB: 03/09/1927 Today's Date: 07/25/2014    History of present illness L tka   OT comments  This 81 yo female making progress and will have 24/7 S prn A at home. No further OT needs, all education has been completed. She is aware that any time she is up on her feet she needs someone with her. Acute OT will sign off.  Follow Up Recommendations  Supervision/Assistance - 24 hour    Equipment Recommendations  None recommended by OT       Precautions / Restrictions Precautions Precautions: Fall;Knee Required Braces or Orthoses: Knee Immobilizer - Left Knee Immobilizer - Left: Discontinue once straight leg raise with < 10 degree lag Restrictions Weight Bearing Restrictions: No       Mobility Bed Mobility               General bed mobility comments: Pt up in recliner upon my arrival  Transfers Overall transfer level: Needs assistance Equipment used: Rolling walker (2 wheeled) Transfers: Sit to/from Stand Sit to Stand: Min guard         General transfer comment: cues for hand placement when sitting down    Balance Overall balance assessment: Needs assistance Sitting-balance support: Feet supported;No upper extremity supported Sitting balance-Leahy Scale: Good     Standing balance support: Single extremity supported;During functional activity Standing balance-Leahy Scale: Poor Standing balance comment: pulling up LB clothing                   ADL Overall ADL's : Needs assistance/impaired     Grooming: Min guard;Standing           Upper Body Dressing : Set up;Sitting   Lower Body Dressing: Min guard;Sit to/from stand   Toilet Transfer: Min guard                              Cognition   Behavior During Therapy: WFL for tasks assessed/performed Overall Cognitive Status: Within Functional Limits for tasks assessed                                     Pertinent Vitals/ Pain       Pain Assessment: 0-10 Pain Score: 2  Pain Location: left knee Pain Descriptors / Indicators: Aching;Sore Pain Intervention(s): Monitored during session;Repositioned;Ice applied         Frequency Min 2X/week     Progress Toward Goals  OT Goals(current goals can now be found in the care plan section)  Progress towards OT goals: Progressing toward goals     Plan Discharge plan remains appropriate       End of Session Equipment Utilized During Treatment: Rolling walker CPM Left Knee CPM Left Knee: Off   Activity Tolerance Patient tolerated treatment well   Patient Left in chair;with call bell/phone within reach   Nurse Communication  (NT: I was finished so she could take out IV)        Time: 1324-4010 OT Time Calculation (min): 14 min  Charges: OT General Charges $OT Visit: 1 Procedure OT Treatments $Self Care/Home Management : 8-22 mins  Almon Register [phone] 07/25/2014, 12:33 PM

## 2014-07-25 NOTE — Progress Notes (Signed)
   Subjective: 3 Days Post-Op Procedure(s) (LRB): LEFT TOTAL KNEE ARTHROPLASTY (Left) Patient reports pain as mild.  Feeling much better today Plan is to go Home after hospital stay.  Objective: Vital signs in last 24 hours: Temp:  [97.9 F (36.6 C)-98.1 F (36.7 C)] 98.1 F (36.7 C) (04/28 0451) Pulse Rate:  [54-80] 67 (04/28 0451) Resp:  [16-17] 16 (04/28 0451) BP: (139-164)/(39-65) 139/39 mmHg (04/28 0451) SpO2:  [95 %-99 %] 95 % (04/28 0451)  Intake/Output from previous day:  Intake/Output Summary (Last 24 hours) at 07/25/14 0746 Last data filed at 07/24/14 2141  Gross per 24 hour  Intake    240 ml  Output   1800 ml  Net  -1560 ml    Intake/Output this shift:    Labs:  Recent Labs  07/23/14 0526 07/24/14 0428 07/25/14 0428  HGB 9.9* 9.8* 8.9*    Recent Labs  07/24/14 0428 07/25/14 0428  WBC 13.5* 10.3  RBC 3.43* 3.25*  HCT 29.8* 28.4*  PLT 234 213    Recent Labs  07/23/14 0526 07/24/14 0428  NA 134* 139  K 4.5 4.1  CL 105 103  CO2 23 28  BUN 25* 29*  CREATININE 1.15* 1.10  GLUCOSE 257* 176*  CALCIUM 8.3* 8.8   No results for input(s): LABPT, INR in the last 72 hours.  EXAM General - Patient is Alert, Appropriate and Oriented Extremity - Neurologically intact Neurovascular intact No cellulitis present Compartment soft Dressing/Incision - clean, dry, no drainage Motor Function - intact, moving foot and toes well on exam.   Past Medical History  Diagnosis Date  . Osteoarthritis   . Mild mitral regurgitation   . Diabetes mellitus     type 2 w complications of proteinuria and retinopathy  . Hypercholesterolemia   . Breast cancer 1991    right  . CKD (chronic kidney disease), stage III     from [facility]-Dr. Deterding  . Carotid artery stenosis 08/2011    nonobstructive 50-70% by doppler  . RLS (restless legs syndrome)     /insomnia-hydroxizine works well..  . Hypertension   . Complication of anesthesia   . PONV (postoperative nausea  and vomiting)   . Asthma     seasonal  . Pernicious anemia   . Difficulty sleeping     due to husbands recent death  . Neuropathy     Assessment/Plan: 3 Days Post-Op Procedure(s) (LRB): LEFT TOTAL KNEE ARTHROPLASTY (Left) Principal Problem:   OA (osteoarthritis) of knee   Discharge home with home health  DVT Prophylaxis - Xarelto Weight-Bearing as tolerated to left leg  Alecea Trego V 07/25/2014, 7:46 AM

## 2014-07-25 NOTE — Progress Notes (Signed)
Physical Therapy Treatment Patient Details Name: [name] MRN: [id number] DOB: [birth year] Today's Date: 07/25/2014    History of Present Illness L tka    PT Comments    POD # 3 pt eager to D/C to home.  No nausea today.  Applied KI and instructed on use for amb and stairs.  Practiced going up 2 steps forward with one rail.  Amb in hallway then returned to room.  Pt given handout HEP TKR TE's and instructed on freq.  Followed by [facility].   Follow Up Recommendations  Home health PT;Supervision/Assistance - 24 hour     Equipment Recommendations  None recommended by PT    Recommendations for Other Services       Precautions / Restrictions Precautions Precautions: Fall;Knee Precaution Comments: Instructed pt on KI use for amb and stairs Required Braces or Orthoses: Knee Immobilizer - Left Knee Immobilizer - Left: Discontinue once straight leg raise with < 10 degree lag Restrictions Weight Bearing Restrictions: No    Mobility  Bed Mobility               General bed mobility comments: Pt up in recliner upon my arrival  Transfers Overall transfer level: Needs assistance Equipment used: Rolling walker (2 wheeled) Transfers: Sit to/from Stand Sit to Stand: Supervision         General transfer comment: one VC on safety with turns  Ambulation/Gait Ambulation/Gait assistance: Supervision Ambulation Distance (Feet): 75 Feet Assistive device: Rolling walker (2 wheeled) Gait Pattern/deviations: Step-to pattern Gait velocity: decreased   General Gait Details: increased time and one VC on safety with backward gait   Stairs Stairs: Yes   Stair Management: One rail Right;Step to pattern;Forwards Number of Stairs: 2 General stair comments: performed 2 steps one rail wit one initial VC.   Wheelchair Mobility    Modified Rankin (Stroke Patients Only)       Balance Overall balance assessment: Needs assistance Sitting-balance support: Feet supported;No upper  extremity supported Sitting balance-Leahy Scale: Good     Standing balance support: Single extremity supported;During functional activity Standing balance-Leahy Scale: Poor Standing balance comment: pulling up LB clothing                    Cognition Arousal/Alertness: Awake/alert Behavior During Therapy: WFL for tasks assessed/performed Overall Cognitive Status: Within Functional Limits for tasks assessed                      Exercises      General Comments        Pertinent Vitals/Pain Pain Assessment: 0-10 Pain Score: 3  Pain Location: L knee Pain Descriptors / Indicators: Aching;Sore Pain Intervention(s): Monitored during session;Premedicated before session;Repositioned;Ice applied    Home Living                      Prior Function            PT Goals (current goals can now be found in the care plan section) Progress towards PT goals: Progressing toward goals    Frequency  7X/week    PT Plan Current plan remains appropriate    Co-evaluation             End of Session Equipment Utilized During Treatment: Gait belt Activity Tolerance: Patient tolerated treatment well Patient left: in chair;with call bell/phone within reach     Time: 0855-0920 PT Time Calculation (min) (ACUTE ONLY): 25 min  Charges:  $Gait Training: 8-22  mins $Therapeutic Activity: 8-22 mins                    G Codes:      [name]  PTA [facility]  Acute  Rehab Pager      [phone]

## 2014-07-26 DIAGNOSIS — I10 Essential (primary) hypertension: Secondary | ICD-10-CM | POA: Diagnosis not present

## 2014-07-26 DIAGNOSIS — E11319 Type 2 diabetes mellitus with unspecified diabetic retinopathy without macular edema: Secondary | ICD-10-CM | POA: Diagnosis not present

## 2014-07-26 DIAGNOSIS — Z96652 Presence of left artificial knee joint: Secondary | ICD-10-CM | POA: Diagnosis not present

## 2014-07-26 DIAGNOSIS — F419 Anxiety disorder, unspecified: Secondary | ICD-10-CM | POA: Diagnosis not present

## 2014-07-26 DIAGNOSIS — Z794 Long term (current) use of insulin: Secondary | ICD-10-CM | POA: Diagnosis not present

## 2014-07-26 DIAGNOSIS — Z471 Aftercare following joint replacement surgery: Secondary | ICD-10-CM | POA: Diagnosis not present

## 2014-07-26 LAB — TYPE AND SCREEN
ABO/RH(D): A NEG
Antibody Screen: POSITIVE
DAT, IgG: NEGATIVE
Unit division: 0
Unit division: 0

## 2014-07-29 DIAGNOSIS — Z471 Aftercare following joint replacement surgery: Secondary | ICD-10-CM | POA: Diagnosis not present

## 2014-07-29 DIAGNOSIS — Z794 Long term (current) use of insulin: Secondary | ICD-10-CM | POA: Diagnosis not present

## 2014-07-29 DIAGNOSIS — F419 Anxiety disorder, unspecified: Secondary | ICD-10-CM | POA: Diagnosis not present

## 2014-07-29 DIAGNOSIS — E11319 Type 2 diabetes mellitus with unspecified diabetic retinopathy without macular edema: Secondary | ICD-10-CM | POA: Diagnosis not present

## 2014-07-29 DIAGNOSIS — I10 Essential (primary) hypertension: Secondary | ICD-10-CM | POA: Diagnosis not present

## 2014-07-29 DIAGNOSIS — Z96652 Presence of left artificial knee joint: Secondary | ICD-10-CM | POA: Diagnosis not present

## 2014-07-31 DIAGNOSIS — I10 Essential (primary) hypertension: Secondary | ICD-10-CM | POA: Diagnosis not present

## 2014-07-31 DIAGNOSIS — E11319 Type 2 diabetes mellitus with unspecified diabetic retinopathy without macular edema: Secondary | ICD-10-CM | POA: Diagnosis not present

## 2014-07-31 DIAGNOSIS — Z471 Aftercare following joint replacement surgery: Secondary | ICD-10-CM | POA: Diagnosis not present

## 2014-07-31 DIAGNOSIS — Z794 Long term (current) use of insulin: Secondary | ICD-10-CM | POA: Diagnosis not present

## 2014-07-31 DIAGNOSIS — Z96652 Presence of left artificial knee joint: Secondary | ICD-10-CM | POA: Diagnosis not present

## 2014-07-31 DIAGNOSIS — F419 Anxiety disorder, unspecified: Secondary | ICD-10-CM | POA: Diagnosis not present

## 2014-08-02 DIAGNOSIS — Z96652 Presence of left artificial knee joint: Secondary | ICD-10-CM | POA: Diagnosis not present

## 2014-08-02 DIAGNOSIS — E11319 Type 2 diabetes mellitus with unspecified diabetic retinopathy without macular edema: Secondary | ICD-10-CM | POA: Diagnosis not present

## 2014-08-02 DIAGNOSIS — F419 Anxiety disorder, unspecified: Secondary | ICD-10-CM | POA: Diagnosis not present

## 2014-08-02 DIAGNOSIS — Z794 Long term (current) use of insulin: Secondary | ICD-10-CM | POA: Diagnosis not present

## 2014-08-02 DIAGNOSIS — I10 Essential (primary) hypertension: Secondary | ICD-10-CM | POA: Diagnosis not present

## 2014-08-02 DIAGNOSIS — Z471 Aftercare following joint replacement surgery: Secondary | ICD-10-CM | POA: Diagnosis not present

## 2014-08-05 DIAGNOSIS — M1712 Unilateral primary osteoarthritis, left knee: Secondary | ICD-10-CM | POA: Diagnosis not present

## 2014-08-05 DIAGNOSIS — Z96652 Presence of left artificial knee joint: Secondary | ICD-10-CM | POA: Diagnosis not present

## 2014-08-05 DIAGNOSIS — R262 Difficulty in walking, not elsewhere classified: Secondary | ICD-10-CM | POA: Diagnosis not present

## 2014-08-05 DIAGNOSIS — Z471 Aftercare following joint replacement surgery: Secondary | ICD-10-CM | POA: Diagnosis not present

## 2014-08-05 DIAGNOSIS — M25562 Pain in left knee: Secondary | ICD-10-CM | POA: Diagnosis not present

## 2014-08-07 DIAGNOSIS — R262 Difficulty in walking, not elsewhere classified: Secondary | ICD-10-CM | POA: Diagnosis not present

## 2014-08-07 DIAGNOSIS — M1712 Unilateral primary osteoarthritis, left knee: Secondary | ICD-10-CM | POA: Diagnosis not present

## 2014-08-07 DIAGNOSIS — M25562 Pain in left knee: Secondary | ICD-10-CM | POA: Diagnosis not present

## 2014-08-09 DIAGNOSIS — M25562 Pain in left knee: Secondary | ICD-10-CM | POA: Diagnosis not present

## 2014-08-09 DIAGNOSIS — R262 Difficulty in walking, not elsewhere classified: Secondary | ICD-10-CM | POA: Diagnosis not present

## 2014-08-09 DIAGNOSIS — M1712 Unilateral primary osteoarthritis, left knee: Secondary | ICD-10-CM | POA: Diagnosis not present

## 2014-08-12 DIAGNOSIS — R262 Difficulty in walking, not elsewhere classified: Secondary | ICD-10-CM | POA: Diagnosis not present

## 2014-08-12 DIAGNOSIS — M1712 Unilateral primary osteoarthritis, left knee: Secondary | ICD-10-CM | POA: Diagnosis not present

## 2014-08-12 DIAGNOSIS — M25562 Pain in left knee: Secondary | ICD-10-CM | POA: Diagnosis not present

## 2014-08-14 DIAGNOSIS — M1712 Unilateral primary osteoarthritis, left knee: Secondary | ICD-10-CM | POA: Diagnosis not present

## 2014-08-14 DIAGNOSIS — M25562 Pain in left knee: Secondary | ICD-10-CM | POA: Diagnosis not present

## 2014-08-14 DIAGNOSIS — R262 Difficulty in walking, not elsewhere classified: Secondary | ICD-10-CM | POA: Diagnosis not present

## 2014-08-16 DIAGNOSIS — M25562 Pain in left knee: Secondary | ICD-10-CM | POA: Diagnosis not present

## 2014-08-16 DIAGNOSIS — M1712 Unilateral primary osteoarthritis, left knee: Secondary | ICD-10-CM | POA: Diagnosis not present

## 2014-08-16 DIAGNOSIS — R262 Difficulty in walking, not elsewhere classified: Secondary | ICD-10-CM | POA: Diagnosis not present

## 2014-08-19 DIAGNOSIS — M1712 Unilateral primary osteoarthritis, left knee: Secondary | ICD-10-CM | POA: Diagnosis not present

## 2014-08-19 DIAGNOSIS — M25562 Pain in left knee: Secondary | ICD-10-CM | POA: Diagnosis not present

## 2014-08-19 DIAGNOSIS — R262 Difficulty in walking, not elsewhere classified: Secondary | ICD-10-CM | POA: Diagnosis not present

## 2014-08-21 DIAGNOSIS — R262 Difficulty in walking, not elsewhere classified: Secondary | ICD-10-CM | POA: Diagnosis not present

## 2014-08-21 DIAGNOSIS — M1712 Unilateral primary osteoarthritis, left knee: Secondary | ICD-10-CM | POA: Diagnosis not present

## 2014-08-21 DIAGNOSIS — M25562 Pain in left knee: Secondary | ICD-10-CM | POA: Diagnosis not present

## 2014-08-23 DIAGNOSIS — M25562 Pain in left knee: Secondary | ICD-10-CM | POA: Diagnosis not present

## 2014-08-23 DIAGNOSIS — R262 Difficulty in walking, not elsewhere classified: Secondary | ICD-10-CM | POA: Diagnosis not present

## 2014-08-23 DIAGNOSIS — M1712 Unilateral primary osteoarthritis, left knee: Secondary | ICD-10-CM | POA: Diagnosis not present

## 2014-08-27 DIAGNOSIS — Z96652 Presence of left artificial knee joint: Secondary | ICD-10-CM | POA: Diagnosis not present

## 2014-08-27 DIAGNOSIS — M7661 Achilles tendinitis, right leg: Secondary | ICD-10-CM | POA: Diagnosis not present

## 2014-08-27 DIAGNOSIS — Z471 Aftercare following joint replacement surgery: Secondary | ICD-10-CM | POA: Diagnosis not present

## 2014-08-28 DIAGNOSIS — M25562 Pain in left knee: Secondary | ICD-10-CM | POA: Diagnosis not present

## 2014-08-28 DIAGNOSIS — M1712 Unilateral primary osteoarthritis, left knee: Secondary | ICD-10-CM | POA: Diagnosis not present

## 2014-08-28 DIAGNOSIS — R262 Difficulty in walking, not elsewhere classified: Secondary | ICD-10-CM | POA: Diagnosis not present

## 2014-08-30 DIAGNOSIS — M25562 Pain in left knee: Secondary | ICD-10-CM | POA: Diagnosis not present

## 2014-08-30 DIAGNOSIS — M1712 Unilateral primary osteoarthritis, left knee: Secondary | ICD-10-CM | POA: Diagnosis not present

## 2014-08-30 DIAGNOSIS — R262 Difficulty in walking, not elsewhere classified: Secondary | ICD-10-CM | POA: Diagnosis not present

## 2014-09-11 DIAGNOSIS — H3531 Nonexudative age-related macular degeneration: Secondary | ICD-10-CM | POA: Diagnosis not present

## 2014-09-11 DIAGNOSIS — H4011X0 Primary open-angle glaucoma, stage unspecified: Secondary | ICD-10-CM | POA: Diagnosis not present

## 2014-09-11 DIAGNOSIS — E119 Type 2 diabetes mellitus without complications: Secondary | ICD-10-CM | POA: Diagnosis not present

## 2014-09-17 DIAGNOSIS — H35362 Drusen (degenerative) of macula, left eye: Secondary | ICD-10-CM | POA: Diagnosis not present

## 2014-09-17 DIAGNOSIS — E1139 Type 2 diabetes mellitus with other diabetic ophthalmic complication: Secondary | ICD-10-CM | POA: Diagnosis not present

## 2014-09-17 DIAGNOSIS — E11359 Type 2 diabetes mellitus with proliferative diabetic retinopathy without macular edema: Secondary | ICD-10-CM | POA: Diagnosis not present

## 2014-10-03 DIAGNOSIS — Z471 Aftercare following joint replacement surgery: Secondary | ICD-10-CM | POA: Diagnosis not present

## 2014-10-03 DIAGNOSIS — Z96652 Presence of left artificial knee joint: Secondary | ICD-10-CM | POA: Diagnosis not present

## 2014-10-22 DIAGNOSIS — N189 Chronic kidney disease, unspecified: Secondary | ICD-10-CM | POA: Diagnosis not present

## 2014-10-22 DIAGNOSIS — E1129 Type 2 diabetes mellitus with other diabetic kidney complication: Secondary | ICD-10-CM | POA: Diagnosis not present

## 2014-10-22 DIAGNOSIS — N183 Chronic kidney disease, stage 3 (moderate): Secondary | ICD-10-CM | POA: Diagnosis not present

## 2014-10-22 DIAGNOSIS — I129 Hypertensive chronic kidney disease with stage 1 through stage 4 chronic kidney disease, or unspecified chronic kidney disease: Secondary | ICD-10-CM | POA: Diagnosis not present

## 2014-10-22 DIAGNOSIS — N2581 Secondary hyperparathyroidism of renal origin: Secondary | ICD-10-CM | POA: Diagnosis not present

## 2014-10-22 DIAGNOSIS — D631 Anemia in chronic kidney disease: Secondary | ICD-10-CM | POA: Diagnosis not present

## 2014-10-31 DIAGNOSIS — M7752 Other enthesopathy of left foot: Secondary | ICD-10-CM | POA: Diagnosis not present

## 2014-11-08 ENCOUNTER — Telehealth: Payer: Self-pay | Admitting: Cardiology

## 2014-11-08 DIAGNOSIS — I10 Essential (primary) hypertension: Secondary | ICD-10-CM

## 2014-11-08 NOTE — Telephone Encounter (Signed)
New Message        Pt calling to schedule Carotid, no order in Epic. Please call back and advise.

## 2014-11-08 NOTE — Telephone Encounter (Signed)
Last carotid U/S results on 01/13/2014, Dr. Radford Pax wanted patient to repeat doppler in 6 months. Order for carotids are in. Will send [facility] message to schedule.

## 2014-11-21 DIAGNOSIS — N183 Chronic kidney disease, stage 3 (moderate): Secondary | ICD-10-CM | POA: Diagnosis not present

## 2014-11-25 ENCOUNTER — Other Ambulatory Visit: Payer: Self-pay | Admitting: Cardiology

## 2014-11-25 DIAGNOSIS — I6523 Occlusion and stenosis of bilateral carotid arteries: Secondary | ICD-10-CM

## 2014-11-25 DIAGNOSIS — I10 Essential (primary) hypertension: Secondary | ICD-10-CM

## 2014-12-03 ENCOUNTER — Ambulatory Visit (HOSPITAL_COMMUNITY)
Admission: RE | Admit: 2014-12-03 | Discharge: 2014-12-03 | Disposition: A | Discharge status: Home / Self Care | Payer: Medicare Other | Source: Ambulatory Visit | Attending: Cardiovascular Disease | Admitting: Cardiovascular Disease

## 2014-12-03 DIAGNOSIS — I10 Essential (primary) hypertension: Secondary | ICD-10-CM | POA: Insufficient documentation

## 2014-12-03 DIAGNOSIS — E119 Type 2 diabetes mellitus without complications: Secondary | ICD-10-CM | POA: Insufficient documentation

## 2014-12-03 DIAGNOSIS — E785 Hyperlipidemia, unspecified: Secondary | ICD-10-CM | POA: Insufficient documentation

## 2014-12-03 DIAGNOSIS — I6523 Occlusion and stenosis of bilateral carotid arteries: Secondary | ICD-10-CM | POA: Insufficient documentation

## 2014-12-06 ENCOUNTER — Telehealth: Payer: Self-pay

## 2014-12-06 DIAGNOSIS — I6523 Occlusion and stenosis of bilateral carotid arteries: Secondary | ICD-10-CM

## 2014-12-06 NOTE — Telephone Encounter (Signed)
Informed patient of results and verbal understanding expressed.  Repeat carotids ordered for scheduling in 6 months. Patient agrees with treatment plan.

## 2014-12-06 NOTE — Telephone Encounter (Signed)
-----   Message from Sueanne Margarita, MD sent at 12/04/2014  8:44 PM EDT ----- 60-79% bilateral carotid artery stenosis - repeat study in 6 weeks

## 2014-12-17 DIAGNOSIS — E875 Hyperkalemia: Secondary | ICD-10-CM | POA: Diagnosis not present

## 2015-01-07 DIAGNOSIS — Z471 Aftercare following joint replacement surgery: Secondary | ICD-10-CM | POA: Diagnosis not present

## 2015-01-07 DIAGNOSIS — Z23 Encounter for immunization: Secondary | ICD-10-CM | POA: Diagnosis not present

## 2015-01-07 DIAGNOSIS — Z96652 Presence of left artificial knee joint: Secondary | ICD-10-CM | POA: Diagnosis not present

## 2015-02-10 DIAGNOSIS — E1121 Type 2 diabetes mellitus with diabetic nephropathy: Secondary | ICD-10-CM | POA: Diagnosis not present

## 2015-02-10 DIAGNOSIS — E1159 Type 2 diabetes mellitus with other circulatory complications: Secondary | ICD-10-CM | POA: Diagnosis not present

## 2015-02-10 DIAGNOSIS — G4700 Insomnia, unspecified: Secondary | ICD-10-CM | POA: Diagnosis not present

## 2015-02-10 DIAGNOSIS — N183 Chronic kidney disease, stage 3 (moderate): Secondary | ICD-10-CM | POA: Diagnosis not present

## 2015-02-10 DIAGNOSIS — I350 Nonrheumatic aortic (valve) stenosis: Secondary | ICD-10-CM | POA: Diagnosis not present

## 2015-02-10 DIAGNOSIS — D509 Iron deficiency anemia, unspecified: Secondary | ICD-10-CM | POA: Diagnosis not present

## 2015-02-10 DIAGNOSIS — Z853 Personal history of malignant neoplasm of breast: Secondary | ICD-10-CM | POA: Diagnosis not present

## 2015-02-10 DIAGNOSIS — E11319 Type 2 diabetes mellitus with unspecified diabetic retinopathy without macular edema: Secondary | ICD-10-CM | POA: Diagnosis not present

## 2015-02-10 DIAGNOSIS — I739 Peripheral vascular disease, unspecified: Secondary | ICD-10-CM | POA: Diagnosis not present

## 2015-02-10 DIAGNOSIS — E782 Mixed hyperlipidemia: Secondary | ICD-10-CM | POA: Diagnosis not present

## 2015-02-10 DIAGNOSIS — Z0000 Encounter for general adult medical examination without abnormal findings: Secondary | ICD-10-CM | POA: Diagnosis not present

## 2015-02-10 DIAGNOSIS — I1310 Hypertensive heart and chronic kidney disease without heart failure, with stage 1 through stage 4 chronic kidney disease, or unspecified chronic kidney disease: Secondary | ICD-10-CM | POA: Diagnosis not present

## 2015-02-11 ENCOUNTER — Encounter: Payer: Self-pay | Admitting: Cardiology

## 2015-04-30 DIAGNOSIS — H353123 Nonexudative age-related macular degeneration, left eye, advanced atrophic without subfoveal involvement: Secondary | ICD-10-CM | POA: Diagnosis not present

## 2015-04-30 DIAGNOSIS — H35362 Drusen (degenerative) of macula, left eye: Secondary | ICD-10-CM | POA: Diagnosis not present

## 2015-04-30 DIAGNOSIS — E113553 Type 2 diabetes mellitus with stable proliferative diabetic retinopathy, bilateral: Secondary | ICD-10-CM | POA: Diagnosis not present

## 2015-04-30 DIAGNOSIS — H43393 Other vitreous opacities, bilateral: Secondary | ICD-10-CM | POA: Diagnosis not present

## 2015-06-04 DIAGNOSIS — H401134 Primary open-angle glaucoma, bilateral, indeterminate stage: Secondary | ICD-10-CM | POA: Diagnosis not present

## 2015-06-04 DIAGNOSIS — H353134 Nonexudative age-related macular degeneration, bilateral, advanced atrophic with subfoveal involvement: Secondary | ICD-10-CM | POA: Diagnosis not present

## 2015-06-04 DIAGNOSIS — H5347 Heteronymous bilateral field defects: Secondary | ICD-10-CM | POA: Diagnosis not present

## 2015-06-04 DIAGNOSIS — E119 Type 2 diabetes mellitus without complications: Secondary | ICD-10-CM | POA: Diagnosis not present

## 2015-06-04 DIAGNOSIS — Z961 Presence of intraocular lens: Secondary | ICD-10-CM | POA: Diagnosis not present

## 2015-06-09 DIAGNOSIS — I6782 Cerebral ischemia: Secondary | ICD-10-CM | POA: Diagnosis not present

## 2015-06-19 DIAGNOSIS — Z961 Presence of intraocular lens: Secondary | ICD-10-CM | POA: Diagnosis not present

## 2015-06-19 DIAGNOSIS — H353134 Nonexudative age-related macular degeneration, bilateral, advanced atrophic with subfoveal involvement: Secondary | ICD-10-CM | POA: Diagnosis not present

## 2015-06-26 DIAGNOSIS — H353123 Nonexudative age-related macular degeneration, left eye, advanced atrophic without subfoveal involvement: Secondary | ICD-10-CM | POA: Diagnosis not present

## 2015-06-26 DIAGNOSIS — H1851 Endothelial corneal dystrophy: Secondary | ICD-10-CM | POA: Diagnosis not present

## 2015-06-26 DIAGNOSIS — E113553 Type 2 diabetes mellitus with stable proliferative diabetic retinopathy, bilateral: Secondary | ICD-10-CM | POA: Diagnosis not present

## 2015-06-26 DIAGNOSIS — H35362 Drusen (degenerative) of macula, left eye: Secondary | ICD-10-CM | POA: Diagnosis not present

## 2015-07-10 ENCOUNTER — Ambulatory Visit (HOSPITAL_COMMUNITY)
Admission: RE | Admit: 2015-07-10 | Discharge: 2015-07-10 | Disposition: A | Discharge status: Home / Self Care | Payer: Medicare Other | Source: Ambulatory Visit | Attending: Cardiology | Admitting: Cardiology

## 2015-07-10 DIAGNOSIS — E1122 Type 2 diabetes mellitus with diabetic chronic kidney disease: Secondary | ICD-10-CM | POA: Diagnosis not present

## 2015-07-10 DIAGNOSIS — I129 Hypertensive chronic kidney disease with stage 1 through stage 4 chronic kidney disease, or unspecified chronic kidney disease: Secondary | ICD-10-CM | POA: Diagnosis not present

## 2015-07-10 DIAGNOSIS — N183 Chronic kidney disease, stage 3 (moderate): Secondary | ICD-10-CM | POA: Diagnosis not present

## 2015-07-10 DIAGNOSIS — I6523 Occlusion and stenosis of bilateral carotid arteries: Secondary | ICD-10-CM | POA: Insufficient documentation

## 2015-07-10 DIAGNOSIS — E1142 Type 2 diabetes mellitus with diabetic polyneuropathy: Secondary | ICD-10-CM | POA: Insufficient documentation

## 2015-07-14 ENCOUNTER — Telehealth: Payer: Self-pay

## 2015-07-14 DIAGNOSIS — I6529 Occlusion and stenosis of unspecified carotid artery: Secondary | ICD-10-CM

## 2015-07-14 NOTE — Telephone Encounter (Signed)
Informed patient of results and verbal understanding expressed.  Repeat carotids ordered to be scheduled in 1 year. Patient st she has not had fasting lab work in a very long time. She has an appointment 5/19 with her PCP and will have them drawn then. She understands to have PCP forward results of lab work to [employer] [facility] for Dr. Radford Pax to review. Patient agrees with treatment plan.

## 2015-07-14 NOTE — Telephone Encounter (Signed)
-----   Message from Sueanne Margarita, MD sent at 07/13/2015  8:51 AM EDT ----- 40-59 bilateral carotid artery stenosis - repeat study in 1 year.  Please get last lipids from PCP

## 2015-07-18 DIAGNOSIS — Z96652 Presence of left artificial knee joint: Secondary | ICD-10-CM | POA: Diagnosis not present

## 2015-07-18 DIAGNOSIS — Z471 Aftercare following joint replacement surgery: Secondary | ICD-10-CM | POA: Diagnosis not present

## 2015-08-08 DIAGNOSIS — N183 Chronic kidney disease, stage 3 (moderate): Secondary | ICD-10-CM | POA: Diagnosis not present

## 2015-08-08 DIAGNOSIS — N189 Chronic kidney disease, unspecified: Secondary | ICD-10-CM | POA: Diagnosis not present

## 2015-08-08 DIAGNOSIS — F329 Major depressive disorder, single episode, unspecified: Secondary | ICD-10-CM | POA: Diagnosis not present

## 2015-08-08 DIAGNOSIS — I129 Hypertensive chronic kidney disease with stage 1 through stage 4 chronic kidney disease, or unspecified chronic kidney disease: Secondary | ICD-10-CM | POA: Diagnosis not present

## 2015-08-08 DIAGNOSIS — D631 Anemia in chronic kidney disease: Secondary | ICD-10-CM | POA: Diagnosis not present

## 2015-08-08 DIAGNOSIS — E785 Hyperlipidemia, unspecified: Secondary | ICD-10-CM | POA: Diagnosis not present

## 2015-08-08 DIAGNOSIS — I739 Peripheral vascular disease, unspecified: Secondary | ICD-10-CM | POA: Diagnosis not present

## 2015-08-08 DIAGNOSIS — I6529 Occlusion and stenosis of unspecified carotid artery: Secondary | ICD-10-CM | POA: Diagnosis not present

## 2015-08-08 DIAGNOSIS — I509 Heart failure, unspecified: Secondary | ICD-10-CM | POA: Diagnosis not present

## 2015-08-08 DIAGNOSIS — N2581 Secondary hyperparathyroidism of renal origin: Secondary | ICD-10-CM | POA: Diagnosis not present

## 2015-08-08 DIAGNOSIS — C50911 Malignant neoplasm of unspecified site of right female breast: Secondary | ICD-10-CM | POA: Diagnosis not present

## 2015-08-08 DIAGNOSIS — M1990 Unspecified osteoarthritis, unspecified site: Secondary | ICD-10-CM | POA: Diagnosis not present

## 2015-08-08 DIAGNOSIS — E1129 Type 2 diabetes mellitus with other diabetic kidney complication: Secondary | ICD-10-CM | POA: Diagnosis not present

## 2015-08-11 DIAGNOSIS — H353134 Nonexudative age-related macular degeneration, bilateral, advanced atrophic with subfoveal involvement: Secondary | ICD-10-CM | POA: Diagnosis not present

## 2015-08-15 DIAGNOSIS — N183 Chronic kidney disease, stage 3 (moderate): Secondary | ICD-10-CM | POA: Diagnosis not present

## 2015-08-15 DIAGNOSIS — E1121 Type 2 diabetes mellitus with diabetic nephropathy: Secondary | ICD-10-CM | POA: Diagnosis not present

## 2015-08-15 DIAGNOSIS — I1310 Hypertensive heart and chronic kidney disease without heart failure, with stage 1 through stage 4 chronic kidney disease, or unspecified chronic kidney disease: Secondary | ICD-10-CM | POA: Diagnosis not present

## 2015-08-15 DIAGNOSIS — E1159 Type 2 diabetes mellitus with other circulatory complications: Secondary | ICD-10-CM | POA: Diagnosis not present

## 2015-08-15 DIAGNOSIS — E782 Mixed hyperlipidemia: Secondary | ICD-10-CM | POA: Diagnosis not present

## 2015-08-15 DIAGNOSIS — E11319 Type 2 diabetes mellitus with unspecified diabetic retinopathy without macular edema: Secondary | ICD-10-CM | POA: Diagnosis not present

## 2015-08-15 DIAGNOSIS — I350 Nonrheumatic aortic (valve) stenosis: Secondary | ICD-10-CM | POA: Diagnosis not present

## 2015-08-15 DIAGNOSIS — I739 Peripheral vascular disease, unspecified: Secondary | ICD-10-CM | POA: Diagnosis not present

## 2015-08-15 DIAGNOSIS — Z794 Long term (current) use of insulin: Secondary | ICD-10-CM | POA: Diagnosis not present

## 2015-08-15 DIAGNOSIS — G4700 Insomnia, unspecified: Secondary | ICD-10-CM | POA: Diagnosis not present

## 2015-08-18 ENCOUNTER — Encounter: Payer: Self-pay | Admitting: Cardiology

## 2015-12-15 DIAGNOSIS — N2581 Secondary hyperparathyroidism of renal origin: Secondary | ICD-10-CM | POA: Diagnosis not present

## 2015-12-15 DIAGNOSIS — E1322 Other specified diabetes mellitus with diabetic chronic kidney disease: Secondary | ICD-10-CM | POA: Diagnosis not present

## 2015-12-15 DIAGNOSIS — N183 Chronic kidney disease, stage 3 (moderate): Secondary | ICD-10-CM | POA: Diagnosis not present

## 2015-12-15 DIAGNOSIS — N182 Chronic kidney disease, stage 2 (mild): Secondary | ICD-10-CM | POA: Diagnosis not present

## 2015-12-15 DIAGNOSIS — D631 Anemia in chronic kidney disease: Secondary | ICD-10-CM | POA: Diagnosis not present

## 2015-12-15 DIAGNOSIS — I129 Hypertensive chronic kidney disease with stage 1 through stage 4 chronic kidney disease, or unspecified chronic kidney disease: Secondary | ICD-10-CM | POA: Diagnosis not present

## 2015-12-15 DIAGNOSIS — N189 Chronic kidney disease, unspecified: Secondary | ICD-10-CM | POA: Diagnosis not present

## 2016-01-02 DIAGNOSIS — Z23 Encounter for immunization: Secondary | ICD-10-CM | POA: Diagnosis not present

## 2016-01-29 DIAGNOSIS — H353221 Exudative age-related macular degeneration, left eye, with active choroidal neovascularization: Secondary | ICD-10-CM | POA: Diagnosis not present

## 2016-01-29 DIAGNOSIS — E113553 Type 2 diabetes mellitus with stable proliferative diabetic retinopathy, bilateral: Secondary | ICD-10-CM | POA: Diagnosis not present

## 2016-01-29 DIAGNOSIS — H353123 Nonexudative age-related macular degeneration, left eye, advanced atrophic without subfoveal involvement: Secondary | ICD-10-CM | POA: Diagnosis not present

## 2016-01-29 DIAGNOSIS — H35362 Drusen (degenerative) of macula, left eye: Secondary | ICD-10-CM | POA: Diagnosis not present

## 2016-02-05 DIAGNOSIS — H353221 Exudative age-related macular degeneration, left eye, with active choroidal neovascularization: Secondary | ICD-10-CM | POA: Diagnosis not present

## 2016-02-07 DIAGNOSIS — S81812A Laceration without foreign body, left lower leg, initial encounter: Secondary | ICD-10-CM | POA: Diagnosis not present

## 2016-02-13 DIAGNOSIS — L02415 Cutaneous abscess of right lower limb: Secondary | ICD-10-CM | POA: Diagnosis not present

## 2016-02-13 DIAGNOSIS — S8011XA Contusion of right lower leg, initial encounter: Secondary | ICD-10-CM | POA: Diagnosis not present

## 2016-03-18 DIAGNOSIS — H353221 Exudative age-related macular degeneration, left eye, with active choroidal neovascularization: Secondary | ICD-10-CM | POA: Diagnosis not present

## 2016-04-12 ENCOUNTER — Telehealth: Payer: Self-pay | Admitting: Cardiology

## 2016-04-12 DIAGNOSIS — N2581 Secondary hyperparathyroidism of renal origin: Secondary | ICD-10-CM | POA: Diagnosis not present

## 2016-04-12 DIAGNOSIS — E785 Hyperlipidemia, unspecified: Secondary | ICD-10-CM | POA: Diagnosis not present

## 2016-04-12 DIAGNOSIS — D631 Anemia in chronic kidney disease: Secondary | ICD-10-CM | POA: Diagnosis not present

## 2016-04-12 DIAGNOSIS — M1990 Unspecified osteoarthritis, unspecified site: Secondary | ICD-10-CM | POA: Diagnosis not present

## 2016-04-12 DIAGNOSIS — I739 Peripheral vascular disease, unspecified: Secondary | ICD-10-CM | POA: Diagnosis not present

## 2016-04-12 DIAGNOSIS — I509 Heart failure, unspecified: Secondary | ICD-10-CM | POA: Diagnosis not present

## 2016-04-12 DIAGNOSIS — E1122 Type 2 diabetes mellitus with diabetic chronic kidney disease: Secondary | ICD-10-CM | POA: Diagnosis not present

## 2016-04-12 DIAGNOSIS — I129 Hypertensive chronic kidney disease with stage 1 through stage 4 chronic kidney disease, or unspecified chronic kidney disease: Secondary | ICD-10-CM | POA: Diagnosis not present

## 2016-04-12 DIAGNOSIS — I6529 Occlusion and stenosis of unspecified carotid artery: Secondary | ICD-10-CM | POA: Diagnosis not present

## 2016-04-12 DIAGNOSIS — C50911 Malignant neoplasm of unspecified site of right female breast: Secondary | ICD-10-CM | POA: Diagnosis not present

## 2016-04-12 DIAGNOSIS — N183 Chronic kidney disease, stage 3 (moderate): Secondary | ICD-10-CM | POA: Diagnosis not present

## 2016-04-12 DIAGNOSIS — F329 Major depressive disorder, single episode, unspecified: Secondary | ICD-10-CM | POA: Diagnosis not present

## 2016-04-12 NOTE — Telephone Encounter (Signed)
Jan states pt saw Dr Jimmy Footman, nephrology, today. Jan states he was asking if pt had scans of arteries in abdomen and legs done at time of carotids. Jan advised other scans not done at time of carotids. Jan will call Dr Deterding's office tomorrow and will ask if Dr Deterding wants other scans performed.

## 2016-04-12 NOTE — Telephone Encounter (Signed)
New message  Pt daughter in law Jan call requesting to speak with RN about getting the vessels in her leg checked. Please call back to discuss

## 2016-05-06 ENCOUNTER — Other Ambulatory Visit: Payer: Self-pay | Admitting: Vascular Surgery

## 2016-05-06 DIAGNOSIS — I70219 Atherosclerosis of native arteries of extremities with intermittent claudication, unspecified extremity: Secondary | ICD-10-CM

## 2016-05-11 ENCOUNTER — Other Ambulatory Visit: Payer: Self-pay | Admitting: Family Medicine

## 2016-05-11 DIAGNOSIS — Z9011 Acquired absence of right breast and nipple: Secondary | ICD-10-CM

## 2016-05-11 DIAGNOSIS — Z1231 Encounter for screening mammogram for malignant neoplasm of breast: Secondary | ICD-10-CM

## 2016-05-13 DIAGNOSIS — H353221 Exudative age-related macular degeneration, left eye, with active choroidal neovascularization: Secondary | ICD-10-CM | POA: Diagnosis not present

## 2016-05-19 ENCOUNTER — Other Ambulatory Visit: Payer: Self-pay | Admitting: Family Medicine

## 2016-05-19 ENCOUNTER — Ambulatory Visit
Admission: RE | Admit: 2016-05-19 | Discharge: 2016-05-19 | Disposition: A | Discharge status: Home / Self Care | Payer: Medicare Other | Source: Ambulatory Visit | Attending: Family Medicine | Admitting: Family Medicine

## 2016-05-19 DIAGNOSIS — R062 Wheezing: Secondary | ICD-10-CM | POA: Diagnosis not present

## 2016-05-19 DIAGNOSIS — I1310 Hypertensive heart and chronic kidney disease without heart failure, with stage 1 through stage 4 chronic kidney disease, or unspecified chronic kidney disease: Secondary | ICD-10-CM | POA: Diagnosis not present

## 2016-05-19 DIAGNOSIS — M810 Age-related osteoporosis without current pathological fracture: Secondary | ICD-10-CM | POA: Diagnosis not present

## 2016-05-19 DIAGNOSIS — G629 Polyneuropathy, unspecified: Secondary | ICD-10-CM | POA: Diagnosis not present

## 2016-05-19 DIAGNOSIS — Z0000 Encounter for general adult medical examination without abnormal findings: Secondary | ICD-10-CM | POA: Diagnosis not present

## 2016-05-19 DIAGNOSIS — I739 Peripheral vascular disease, unspecified: Secondary | ICD-10-CM | POA: Diagnosis not present

## 2016-05-19 DIAGNOSIS — E1159 Type 2 diabetes mellitus with other circulatory complications: Secondary | ICD-10-CM | POA: Diagnosis not present

## 2016-05-19 DIAGNOSIS — E11319 Type 2 diabetes mellitus with unspecified diabetic retinopathy without macular edema: Secondary | ICD-10-CM | POA: Diagnosis not present

## 2016-05-19 DIAGNOSIS — N183 Chronic kidney disease, stage 3 (moderate): Secondary | ICD-10-CM | POA: Diagnosis not present

## 2016-05-19 DIAGNOSIS — D509 Iron deficiency anemia, unspecified: Secondary | ICD-10-CM | POA: Diagnosis not present

## 2016-05-19 DIAGNOSIS — E782 Mixed hyperlipidemia: Secondary | ICD-10-CM | POA: Diagnosis not present

## 2016-05-19 DIAGNOSIS — I350 Nonrheumatic aortic (valve) stenosis: Secondary | ICD-10-CM | POA: Diagnosis not present

## 2016-05-19 DIAGNOSIS — M150 Primary generalized (osteo)arthritis: Secondary | ICD-10-CM | POA: Diagnosis not present

## 2016-06-14 ENCOUNTER — Ambulatory Visit: Payer: Medicare Other

## 2016-06-18 ENCOUNTER — Encounter: Payer: Self-pay | Admitting: Vascular Surgery

## 2016-06-21 ENCOUNTER — Ambulatory Visit
Admission: RE | Admit: 2016-06-21 | Discharge: 2016-06-21 | Disposition: A | Discharge status: Home / Self Care | Payer: Medicare Other | Source: Ambulatory Visit | Attending: Family Medicine | Admitting: Family Medicine

## 2016-06-21 DIAGNOSIS — Z9011 Acquired absence of right breast and nipple: Secondary | ICD-10-CM

## 2016-06-21 DIAGNOSIS — Z1231 Encounter for screening mammogram for malignant neoplasm of breast: Secondary | ICD-10-CM | POA: Diagnosis not present

## 2016-06-23 ENCOUNTER — Other Ambulatory Visit: Payer: Self-pay | Admitting: Family Medicine

## 2016-06-23 DIAGNOSIS — R928 Other abnormal and inconclusive findings on diagnostic imaging of breast: Secondary | ICD-10-CM

## 2016-06-25 ENCOUNTER — Ambulatory Visit
Admission: RE | Admit: 2016-06-25 | Discharge: 2016-06-25 | Disposition: A | Discharge status: Home / Self Care | Payer: Medicare Other | Source: Ambulatory Visit | Attending: Family Medicine | Admitting: Family Medicine

## 2016-06-25 DIAGNOSIS — R928 Other abnormal and inconclusive findings on diagnostic imaging of breast: Secondary | ICD-10-CM

## 2016-06-25 DIAGNOSIS — N6489 Other specified disorders of breast: Secondary | ICD-10-CM | POA: Diagnosis not present

## 2016-06-29 ENCOUNTER — Ambulatory Visit (INDEPENDENT_AMBULATORY_CARE_PROVIDER_SITE_OTHER): Payer: Medicare Other | Admitting: Vascular Surgery

## 2016-06-29 ENCOUNTER — Ambulatory Visit (HOSPITAL_COMMUNITY)
Admission: RE | Admit: 2016-06-29 | Discharge: 2016-06-29 | Disposition: A | Discharge status: Home / Self Care | Payer: Medicare Other | Source: Ambulatory Visit | Attending: Vascular Surgery | Admitting: Vascular Surgery

## 2016-06-29 ENCOUNTER — Encounter: Payer: Self-pay | Admitting: Vascular Surgery

## 2016-06-29 ENCOUNTER — Encounter (HOSPITAL_COMMUNITY): Payer: Self-pay

## 2016-06-29 VITALS — BP 164/59 | HR 62 | Temp 97.0°F | Resp 20 | Ht 65.0 in | Wt 137.0 lb

## 2016-06-29 DIAGNOSIS — G4762 Sleep related leg cramps: Secondary | ICD-10-CM | POA: Diagnosis not present

## 2016-06-29 DIAGNOSIS — I10 Essential (primary) hypertension: Secondary | ICD-10-CM | POA: Diagnosis not present

## 2016-06-29 DIAGNOSIS — E119 Type 2 diabetes mellitus without complications: Secondary | ICD-10-CM | POA: Insufficient documentation

## 2016-06-29 DIAGNOSIS — Z9011 Acquired absence of right breast and nipple: Secondary | ICD-10-CM | POA: Diagnosis not present

## 2016-06-29 DIAGNOSIS — I70219 Atherosclerosis of native arteries of extremities with intermittent claudication, unspecified extremity: Secondary | ICD-10-CM

## 2016-06-29 NOTE — Progress Notes (Signed)
[facility] [place]  Patient name: [name] MRN: [id number] DOB: [birth year] Sex: female  REASON FOR CONSULT: Evaluation of peripheral vascular disease  HPI: [name] is a 83 y.o. female, who is seen today for evaluation for faster disease. Long-standing history of diabetes. She reported some cramping in her legs and is seen today to rule out arterial pathology for the cause of this. This is not claudication. She reports episodes of cramping in her lower extremities associated with nocturnal leg cramps. She has increased her gabapentin appears this is improving her symptoms. She does walk and is active despite her age of 32. She has a known history of moderate bilateral carotid disease which is being followed in our cardiology office with annual carotid duplex. Most recently in April 2017 she had 40-59% stenoses bilaterally. She's had no symptoms associated with this.  Past Medical History:  Diagnosis Date  . Asthma    seasonal  . Breast cancer ([place]) 1991   right  . Carotid artery stenosis 08/2011   nonobstructive 50-70% by doppler  . CKD (chronic kidney disease), stage III    from [facility]-Dr. Deterding  . Complication of anesthesia   . Diabetes mellitus ([place])    type 2 w complications of proteinuria and retinopathy  . Difficulty sleeping    due to husbands recent death  . Hypercholesterolemia   . Hypertension   . Mild mitral regurgitation   . Neuropathy ([place])   . Osteoarthritis   . Pernicious anemia   . PONV (postoperative nausea and vomiting)   . RLS (restless legs syndrome)    /insomnia-hydroxizine works well..    Family History  Problem Relation Age of Onset  . Emphysema Father   . Lymphoma Father     SOCIAL HISTORY: Social History   Social History  . Marital status: Married    Spouse name: N/A  . Number of children: 4  . Years of education: N/A   Occupational History  . retired Retired    Social History Main Topics  . Smoking status: Never Smoker  . Smokeless tobacco: Never Used  . Alcohol use No  . Drug use: No  . Sexual activity: Not Currently   Other Topics Concern  . Not on file   Social History Narrative  . No narrative on file    Allergies  Allergen Reactions  . Atorvastatin Other (See Comments)    LEG CRAMPS  . Bisphosphonates     Contraindicated due to renal disease  . Ceftin [Cefuroxime]     RASH  . Penicillins     rash  . Shellfish Allergy     hives  . Simvastatin Other (See Comments)    LEG CRAMPS  . Zetia [Ezetimibe]     hives    Current Outpatient Prescriptions  Medication Sig Dispense Refill  . acetaminophen (TYLENOL) 500 MG tablet Take 1,000 mg by mouth every 6 (six) hours as needed for moderate pain or headache.    . ALPRAZolam (XANAX) 0.25 MG tablet Take 1 tablet by mouth 2 (two) times daily as needed for anxiety.     Marland Kitchen amLODipine (NORVASC) 10 MG tablet Take 10 mg by mouth at bedtime.     . furosemide (LASIX) 40 MG tablet Take 40 mg by mouth 2 (two) times daily. One tablet in the am and one at noon.    . gabapentin (NEURONTIN) 100 MG capsule Take 100-200 mg by mouth 2 (two) times daily. Takes one capsule  in the morning and two capsules at night.    Marland Kitchen HYDROcodone-acetaminophen (NORCO/VICODIN) 5-325 MG per tablet Take 1-2 tablets by mouth every 4 (four) hours as needed for moderate pain or severe pain. 80 tablet 0  . insulin NPH (NOVOLIN N) 100 UNIT/ML injection Inject 30 Units into the skin every morning.     . latanoprost (XALATAN) 0.005 % ophthalmic solution Place 1 drop into both eyes at bedtime.     . methocarbamol (ROBAXIN) 500 MG tablet Take 1 tablet (500 mg total) by mouth every 6 (six) hours as needed for muscle spasms. 80 tablet 0  . promethazine (PHENERGAN) 12.5 MG tablet Take 1 tablet (12.5 mg total) by mouth every 6 (six) hours as needed for nausea or vomiting. 60 tablet 0  . rivaroxaban (XARELTO) 10 MG TABS tablet Take 1  tablet (10 mg total) by mouth daily with breakfast. Take Xarelto for two and a half more weeks, then discontinue Xarelto. Once the patient has completed the Xarelto, they may resume the 81 mg Aspirin. 18 tablet 0  . traMADol (ULTRAM) 50 MG tablet Take 1-2 tablets (50-100 mg total) by mouth every 6 (six) hours as needed (mild pain). 60 tablet 1  . valsartan (DIOVAN) 320 MG tablet Take 1 tablet (320 mg total) by mouth daily. (Patient taking differently: Take 160 mg by mouth 2 (two) times daily. ) 90 tablet 1   No current facility-administered medications for this visit.     REVIEW OF SYSTEMS:  [X]  denotes positive finding, [ ]  denotes negative finding Cardiac  Comments:  Chest pain or chest pressure:    Shortness of breath upon exertion:    Short of breath when lying flat:    Irregular heart rhythm:        Vascular    Pain in calf, thigh, or hip brought on by ambulation:    Pain in feet at night that wakes you up from your sleep:     Blood clot in your veins:    Leg swelling:         Pulmonary    Oxygen at home:    Productive cough:     Wheezing:         Neurologic    Sudden weakness in arms or legs:     Sudden numbness in arms or legs:  x hands  Sudden onset of difficulty speaking or slurred speech:    Temporary loss of vision in one eye:     Problems with dizziness:         Gastrointestinal    Blood in stool:     Vomited blood:         Genitourinary    Burning when urinating:     Blood in urine:        Psychiatric    Major depression:         Hematologic    Bleeding problems:    Problems with blood clotting too easily:        Skin    Rashes or ulcers:        Constitutional    Fever or chills:      PHYSICAL EXAM: Vitals:   06/29/16 1321  BP: (!) 164/59  Pulse: 62  Resp: 20  Temp: 97 F (36.1 C)  TempSrc: Oral  SpO2: 95%  Weight: 137 lb (62.1 kg)  Height: 5\' 5"  (1.651 m)    GENERAL: The patient is a well-nourished female, in no acute distress. The  vital signs  are documented above. CARDIOVASCULAR: 2+ radial and 2+ dorsalis pedis pulses bilaterally. Soft left carotid bruit and no bruit on the right PULMONARY: There is good air exchange  ABDOMEN: Soft and non-tender  MUSCULOSKELETAL: There are no major deformities or cyanosis. NEUROLOGIC: No focal weakness or paresthesias are detected. SKIN: There are no ulcers or rashes noted. PSYCHIATRIC: The patient has a normal affect.  DATA:  Noninvasive lower from the arterial studies reveal normal triphasic waveforms bilaterally and slightly diminished ankle arm indices bilaterally  MEDICAL ISSUES: Discuss these findings with patient and her daughter present. No evidence of lower extremity peripheral vascular disease and certainly nothing that would be associated with her nocturnal cramping. She was reassured this discussion will see [place] again on an as-needed basis. Will continue her yearly carotid duplex at her cardiology office   Rosetta Posner, MD [facility] Tel [phone] Pager [phone]

## 2016-07-06 DIAGNOSIS — H353221 Exudative age-related macular degeneration, left eye, with active choroidal neovascularization: Secondary | ICD-10-CM | POA: Diagnosis not present

## 2016-07-07 ENCOUNTER — Encounter (HOSPITAL_COMMUNITY): Payer: Self-pay | Admitting: Cardiology

## 2016-08-11 ENCOUNTER — Encounter (HOSPITAL_COMMUNITY): Payer: Medicare Other

## 2016-09-15 ENCOUNTER — Ambulatory Visit (HOSPITAL_COMMUNITY)
Admission: RE | Admit: 2016-09-15 | Discharge: 2016-09-15 | Disposition: A | Discharge status: Home / Self Care | Payer: Medicare Other | Source: Ambulatory Visit | Attending: Cardiovascular Disease | Admitting: Cardiovascular Disease

## 2016-09-15 DIAGNOSIS — I6523 Occlusion and stenosis of bilateral carotid arteries: Secondary | ICD-10-CM | POA: Insufficient documentation

## 2016-09-15 DIAGNOSIS — I6529 Occlusion and stenosis of unspecified carotid artery: Secondary | ICD-10-CM

## 2016-09-16 ENCOUNTER — Encounter: Payer: Self-pay | Admitting: Cardiology

## 2016-09-27 ENCOUNTER — Telehealth: Payer: Self-pay

## 2016-09-27 DIAGNOSIS — I6523 Occlusion and stenosis of bilateral carotid arteries: Secondary | ICD-10-CM

## 2016-09-27 NOTE — Telephone Encounter (Signed)
-----   Message from Sueanne Margarita, MD sent at 09/16/2016  8:20 PM EDT ----- 40-59% bilateral carotid artery stenosis - repeat dopplers in 1 year

## 2016-09-27 NOTE — Telephone Encounter (Signed)
Repeat carotids ordered to be scheduled in 1 year. 

## 2016-09-30 DIAGNOSIS — E113553 Type 2 diabetes mellitus with stable proliferative diabetic retinopathy, bilateral: Secondary | ICD-10-CM | POA: Diagnosis not present

## 2016-09-30 DIAGNOSIS — H353124 Nonexudative age-related macular degeneration, left eye, advanced atrophic with subfoveal involvement: Secondary | ICD-10-CM | POA: Diagnosis not present

## 2016-09-30 DIAGNOSIS — H353114 Nonexudative age-related macular degeneration, right eye, advanced atrophic with subfoveal involvement: Secondary | ICD-10-CM | POA: Diagnosis not present

## 2016-09-30 DIAGNOSIS — H353222 Exudative age-related macular degeneration, left eye, with inactive choroidal neovascularization: Secondary | ICD-10-CM | POA: Diagnosis not present

## 2016-11-10 DIAGNOSIS — C50911 Malignant neoplasm of unspecified site of right female breast: Secondary | ICD-10-CM | POA: Diagnosis not present

## 2016-11-10 DIAGNOSIS — I129 Hypertensive chronic kidney disease with stage 1 through stage 4 chronic kidney disease, or unspecified chronic kidney disease: Secondary | ICD-10-CM | POA: Diagnosis not present

## 2016-11-10 DIAGNOSIS — N2581 Secondary hyperparathyroidism of renal origin: Secondary | ICD-10-CM | POA: Diagnosis not present

## 2016-11-10 DIAGNOSIS — I6529 Occlusion and stenosis of unspecified carotid artery: Secondary | ICD-10-CM | POA: Diagnosis not present

## 2016-11-10 DIAGNOSIS — E1122 Type 2 diabetes mellitus with diabetic chronic kidney disease: Secondary | ICD-10-CM | POA: Diagnosis not present

## 2016-11-10 DIAGNOSIS — D631 Anemia in chronic kidney disease: Secondary | ICD-10-CM | POA: Diagnosis not present

## 2016-11-10 DIAGNOSIS — I779 Disorder of arteries and arterioles, unspecified: Secondary | ICD-10-CM | POA: Diagnosis not present

## 2016-11-10 DIAGNOSIS — H401131 Primary open-angle glaucoma, bilateral, mild stage: Secondary | ICD-10-CM | POA: Diagnosis not present

## 2016-11-10 DIAGNOSIS — D649 Anemia, unspecified: Secondary | ICD-10-CM | POA: Diagnosis not present

## 2016-11-10 DIAGNOSIS — N183 Chronic kidney disease, stage 3 (moderate): Secondary | ICD-10-CM | POA: Diagnosis not present

## 2016-11-10 DIAGNOSIS — I509 Heart failure, unspecified: Secondary | ICD-10-CM | POA: Diagnosis not present

## 2016-11-10 DIAGNOSIS — E785 Hyperlipidemia, unspecified: Secondary | ICD-10-CM | POA: Diagnosis not present

## 2016-11-10 DIAGNOSIS — I739 Peripheral vascular disease, unspecified: Secondary | ICD-10-CM | POA: Diagnosis not present

## 2016-12-21 DIAGNOSIS — M7710 Lateral epicondylitis, unspecified elbow: Secondary | ICD-10-CM | POA: Diagnosis not present

## 2016-12-21 DIAGNOSIS — E782 Mixed hyperlipidemia: Secondary | ICD-10-CM | POA: Diagnosis not present

## 2016-12-21 DIAGNOSIS — Z23 Encounter for immunization: Secondary | ICD-10-CM | POA: Diagnosis not present

## 2016-12-21 DIAGNOSIS — Z794 Long term (current) use of insulin: Secondary | ICD-10-CM | POA: Diagnosis not present

## 2016-12-21 DIAGNOSIS — D509 Iron deficiency anemia, unspecified: Secondary | ICD-10-CM | POA: Diagnosis not present

## 2016-12-21 DIAGNOSIS — N183 Chronic kidney disease, stage 3 (moderate): Secondary | ICD-10-CM | POA: Diagnosis not present

## 2016-12-21 DIAGNOSIS — G629 Polyneuropathy, unspecified: Secondary | ICD-10-CM | POA: Diagnosis not present

## 2016-12-21 DIAGNOSIS — I1310 Hypertensive heart and chronic kidney disease without heart failure, with stage 1 through stage 4 chronic kidney disease, or unspecified chronic kidney disease: Secondary | ICD-10-CM | POA: Diagnosis not present

## 2016-12-21 DIAGNOSIS — I739 Peripheral vascular disease, unspecified: Secondary | ICD-10-CM | POA: Diagnosis not present

## 2016-12-21 DIAGNOSIS — I350 Nonrheumatic aortic (valve) stenosis: Secondary | ICD-10-CM | POA: Diagnosis not present

## 2016-12-21 DIAGNOSIS — E113293 Type 2 diabetes mellitus with mild nonproliferative diabetic retinopathy without macular edema, bilateral: Secondary | ICD-10-CM | POA: Diagnosis not present

## 2017-01-06 DIAGNOSIS — H353222 Exudative age-related macular degeneration, left eye, with inactive choroidal neovascularization: Secondary | ICD-10-CM | POA: Diagnosis not present

## 2017-01-06 DIAGNOSIS — Z23 Encounter for immunization: Secondary | ICD-10-CM | POA: Diagnosis not present

## 2017-01-06 DIAGNOSIS — H353124 Nonexudative age-related macular degeneration, left eye, advanced atrophic with subfoveal involvement: Secondary | ICD-10-CM | POA: Diagnosis not present

## 2017-01-06 DIAGNOSIS — E113553 Type 2 diabetes mellitus with stable proliferative diabetic retinopathy, bilateral: Secondary | ICD-10-CM | POA: Diagnosis not present

## 2017-01-06 DIAGNOSIS — H353114 Nonexudative age-related macular degeneration, right eye, advanced atrophic with subfoveal involvement: Secondary | ICD-10-CM | POA: Diagnosis not present

## 2017-01-10 ENCOUNTER — Other Ambulatory Visit: Payer: Self-pay | Admitting: Family Medicine

## 2017-01-10 DIAGNOSIS — N6489 Other specified disorders of breast: Secondary | ICD-10-CM

## 2017-01-18 ENCOUNTER — Ambulatory Visit: Admission: RE | Admit: 2017-01-18 | Payer: Medicare Other | Source: Ambulatory Visit

## 2017-01-18 ENCOUNTER — Ambulatory Visit
Admission: RE | Admit: 2017-01-18 | Discharge: 2017-01-18 | Disposition: A | Discharge status: Home / Self Care | Payer: Medicare Other | Source: Ambulatory Visit | Attending: Family Medicine | Admitting: Family Medicine

## 2017-01-18 DIAGNOSIS — R928 Other abnormal and inconclusive findings on diagnostic imaging of breast: Secondary | ICD-10-CM | POA: Diagnosis not present

## 2017-01-18 DIAGNOSIS — N6489 Other specified disorders of breast: Secondary | ICD-10-CM

## 2017-02-10 DIAGNOSIS — R0989 Other specified symptoms and signs involving the circulatory and respiratory systems: Secondary | ICD-10-CM | POA: Diagnosis not present

## 2017-02-10 DIAGNOSIS — R05 Cough: Secondary | ICD-10-CM | POA: Diagnosis not present

## 2017-02-15 ENCOUNTER — Emergency Department (HOSPITAL_COMMUNITY): Payer: Medicare Other

## 2017-02-15 ENCOUNTER — Other Ambulatory Visit: Payer: Self-pay | Admitting: Physician Assistant

## 2017-02-15 ENCOUNTER — Encounter (HOSPITAL_COMMUNITY): Payer: Self-pay

## 2017-02-15 ENCOUNTER — Ambulatory Visit
Admission: RE | Admit: 2017-02-15 | Discharge: 2017-02-15 | Disposition: A | Discharge status: Home / Self Care | Payer: Medicare Other | Source: Ambulatory Visit | Attending: Physician Assistant | Admitting: Physician Assistant

## 2017-02-15 DIAGNOSIS — J189 Pneumonia, unspecified organism: Secondary | ICD-10-CM | POA: Diagnosis not present

## 2017-02-15 DIAGNOSIS — I472 Ventricular tachycardia: Secondary | ICD-10-CM | POA: Diagnosis not present

## 2017-02-15 DIAGNOSIS — D638 Anemia in other chronic diseases classified elsewhere: Secondary | ICD-10-CM | POA: Diagnosis present

## 2017-02-15 DIAGNOSIS — R7989 Other specified abnormal findings of blood chemistry: Secondary | ICD-10-CM

## 2017-02-15 DIAGNOSIS — Z888 Allergy status to other drugs, medicaments and biological substances status: Secondary | ICD-10-CM

## 2017-02-15 DIAGNOSIS — G4700 Insomnia, unspecified: Secondary | ICD-10-CM | POA: Diagnosis present

## 2017-02-15 DIAGNOSIS — I10 Essential (primary) hypertension: Secondary | ICD-10-CM | POA: Diagnosis not present

## 2017-02-15 DIAGNOSIS — R05 Cough: Secondary | ICD-10-CM

## 2017-02-15 DIAGNOSIS — I340 Nonrheumatic mitral (valve) insufficiency: Secondary | ICD-10-CM | POA: Diagnosis not present

## 2017-02-15 DIAGNOSIS — J449 Chronic obstructive pulmonary disease, unspecified: Secondary | ICD-10-CM | POA: Diagnosis present

## 2017-02-15 DIAGNOSIS — E7800 Pure hypercholesterolemia, unspecified: Secondary | ICD-10-CM | POA: Diagnosis not present

## 2017-02-15 DIAGNOSIS — R0689 Other abnormalities of breathing: Secondary | ICD-10-CM

## 2017-02-15 DIAGNOSIS — R112 Nausea with vomiting, unspecified: Secondary | ICD-10-CM | POA: Diagnosis present

## 2017-02-15 DIAGNOSIS — J9601 Acute respiratory failure with hypoxia: Secondary | ICD-10-CM | POA: Diagnosis present

## 2017-02-15 DIAGNOSIS — R918 Other nonspecific abnormal finding of lung field: Secondary | ICD-10-CM | POA: Diagnosis not present

## 2017-02-15 DIAGNOSIS — E1151 Type 2 diabetes mellitus with diabetic peripheral angiopathy without gangrene: Secondary | ICD-10-CM | POA: Diagnosis present

## 2017-02-15 DIAGNOSIS — I272 Pulmonary hypertension, unspecified: Secondary | ICD-10-CM | POA: Diagnosis present

## 2017-02-15 DIAGNOSIS — Z4682 Encounter for fitting and adjustment of non-vascular catheter: Secondary | ICD-10-CM | POA: Diagnosis not present

## 2017-02-15 DIAGNOSIS — Z794 Long term (current) use of insulin: Secondary | ICD-10-CM

## 2017-02-15 DIAGNOSIS — J9690 Respiratory failure, unspecified, unspecified whether with hypoxia or hypercapnia: Secondary | ICD-10-CM

## 2017-02-15 DIAGNOSIS — R55 Syncope and collapse: Secondary | ICD-10-CM

## 2017-02-15 DIAGNOSIS — Z9010 Acquired absence of unspecified breast and nipple: Secondary | ICD-10-CM

## 2017-02-15 DIAGNOSIS — E11319 Type 2 diabetes mellitus with unspecified diabetic retinopathy without macular edema: Secondary | ICD-10-CM | POA: Diagnosis present

## 2017-02-15 DIAGNOSIS — N179 Acute kidney failure, unspecified: Secondary | ICD-10-CM | POA: Diagnosis not present

## 2017-02-15 DIAGNOSIS — E1140 Type 2 diabetes mellitus with diabetic neuropathy, unspecified: Secondary | ICD-10-CM | POA: Diagnosis present

## 2017-02-15 DIAGNOSIS — Z825 Family history of asthma and other chronic lower respiratory diseases: Secondary | ICD-10-CM

## 2017-02-15 DIAGNOSIS — E872 Acidosis: Secondary | ICD-10-CM | POA: Diagnosis not present

## 2017-02-15 DIAGNOSIS — Z886 Allergy status to analgesic agent status: Secondary | ICD-10-CM

## 2017-02-15 DIAGNOSIS — E1122 Type 2 diabetes mellitus with diabetic chronic kidney disease: Secondary | ICD-10-CM | POA: Diagnosis present

## 2017-02-15 DIAGNOSIS — R069 Unspecified abnormalities of breathing: Secondary | ICD-10-CM | POA: Diagnosis not present

## 2017-02-15 DIAGNOSIS — R0902 Hypoxemia: Secondary | ICD-10-CM | POA: Diagnosis not present

## 2017-02-15 DIAGNOSIS — I25110 Atherosclerotic heart disease of native coronary artery with unstable angina pectoris: Secondary | ICD-10-CM | POA: Diagnosis not present

## 2017-02-15 DIAGNOSIS — Z881 Allergy status to other antibiotic agents status: Secondary | ICD-10-CM

## 2017-02-15 DIAGNOSIS — Z01818 Encounter for other preprocedural examination: Secondary | ICD-10-CM

## 2017-02-15 DIAGNOSIS — I130 Hypertensive heart and chronic kidney disease with heart failure and stage 1 through stage 4 chronic kidney disease, or unspecified chronic kidney disease: Secondary | ICD-10-CM | POA: Diagnosis not present

## 2017-02-15 DIAGNOSIS — I5030 Unspecified diastolic (congestive) heart failure: Secondary | ICD-10-CM | POA: Diagnosis present

## 2017-02-15 DIAGNOSIS — Z79891 Long term (current) use of opiate analgesic: Secondary | ICD-10-CM

## 2017-02-15 DIAGNOSIS — I214 Non-ST elevation (NSTEMI) myocardial infarction: Secondary | ICD-10-CM | POA: Diagnosis not present

## 2017-02-15 DIAGNOSIS — Z79899 Other long term (current) drug therapy: Secondary | ICD-10-CM

## 2017-02-15 DIAGNOSIS — N183 Chronic kidney disease, stage 3 unspecified: Secondary | ICD-10-CM | POA: Diagnosis present

## 2017-02-15 DIAGNOSIS — R079 Chest pain, unspecified: Secondary | ICD-10-CM

## 2017-02-15 DIAGNOSIS — R778 Other specified abnormalities of plasma proteins: Secondary | ICD-10-CM

## 2017-02-15 DIAGNOSIS — I959 Hypotension, unspecified: Secondary | ICD-10-CM | POA: Diagnosis present

## 2017-02-15 DIAGNOSIS — Z880 Allergy status to penicillin: Secondary | ICD-10-CM

## 2017-02-15 DIAGNOSIS — R059 Cough, unspecified: Secondary | ICD-10-CM

## 2017-02-15 DIAGNOSIS — E1129 Type 2 diabetes mellitus with other diabetic kidney complication: Secondary | ICD-10-CM | POA: Diagnosis present

## 2017-02-15 DIAGNOSIS — Z853 Personal history of malignant neoplasm of breast: Secondary | ICD-10-CM

## 2017-02-15 DIAGNOSIS — I350 Nonrheumatic aortic (valve) stenosis: Secondary | ICD-10-CM

## 2017-02-15 DIAGNOSIS — Z91013 Allergy to seafood: Secondary | ICD-10-CM

## 2017-02-15 DIAGNOSIS — J810 Acute pulmonary edema: Secondary | ICD-10-CM

## 2017-02-15 DIAGNOSIS — G2581 Restless legs syndrome: Secondary | ICD-10-CM | POA: Diagnosis present

## 2017-02-15 DIAGNOSIS — I2510 Atherosclerotic heart disease of native coronary artery without angina pectoris: Secondary | ICD-10-CM | POA: Diagnosis present

## 2017-02-15 DIAGNOSIS — J690 Pneumonitis due to inhalation of food and vomit: Secondary | ICD-10-CM | POA: Diagnosis not present

## 2017-02-15 DIAGNOSIS — Z96653 Presence of artificial knee joint, bilateral: Secondary | ICD-10-CM | POA: Diagnosis present

## 2017-02-15 DIAGNOSIS — R0602 Shortness of breath: Secondary | ICD-10-CM | POA: Diagnosis not present

## 2017-02-15 DIAGNOSIS — I2584 Coronary atherosclerosis due to calcified coronary lesion: Secondary | ICD-10-CM | POA: Diagnosis present

## 2017-02-15 DIAGNOSIS — R579 Shock, unspecified: Secondary | ICD-10-CM | POA: Diagnosis not present

## 2017-02-15 DIAGNOSIS — Z7982 Long term (current) use of aspirin: Secondary | ICD-10-CM

## 2017-02-15 DIAGNOSIS — I361 Nonrheumatic tricuspid (valve) insufficiency: Secondary | ICD-10-CM | POA: Diagnosis not present

## 2017-02-15 DIAGNOSIS — E785 Hyperlipidemia, unspecified: Secondary | ICD-10-CM | POA: Diagnosis present

## 2017-02-15 LAB — PROTIME-INR
INR: 1.14
PROTHROMBIN TIME: 14.5 s (ref 11.4–15.2)

## 2017-02-15 LAB — I-STAT CHEM 8, ED
BUN: 39 mg/dL — AB (ref 6–20)
CHLORIDE: 104 mmol/L (ref 101–111)
CREATININE: 1.5 mg/dL — AB (ref 0.44–1.00)
Calcium, Ion: 1.14 mmol/L — ABNORMAL LOW (ref 1.15–1.40)
Glucose, Bld: 118 mg/dL — ABNORMAL HIGH (ref 65–99)
HEMATOCRIT: 30 % — AB (ref 36.0–46.0)
Hemoglobin: 10.2 g/dL — ABNORMAL LOW (ref 12.0–15.0)
POTASSIUM: 4.4 mmol/L (ref 3.5–5.1)
Sodium: 137 mmol/L (ref 135–145)
TCO2: 24 mmol/L (ref 22–32)

## 2017-02-15 LAB — COMPREHENSIVE METABOLIC PANEL
ALK PHOS: 59 U/L (ref 38–126)
ALT: 15 U/L (ref 14–54)
AST: 25 U/L (ref 15–41)
Albumin: 3.1 g/dL — ABNORMAL LOW (ref 3.5–5.0)
Anion gap: 10 (ref 5–15)
BUN: 36 mg/dL — AB (ref 6–20)
CHLORIDE: 103 mmol/L (ref 101–111)
CO2: 22 mmol/L (ref 22–32)
CREATININE: 1.56 mg/dL — AB (ref 0.44–1.00)
Calcium: 8.9 mg/dL (ref 8.9–10.3)
GFR calc Af Amer: 33 mL/min — ABNORMAL LOW (ref >60)
GFR, EST NON AFRICAN AMERICAN: 29 mL/min — AB (ref >60)
Glucose, Bld: 115 mg/dL — ABNORMAL HIGH (ref 65–99)
Potassium: 4.4 mmol/L (ref 3.5–5.1)
Sodium: 135 mmol/L (ref 135–145)
Total Bilirubin: 1.1 mg/dL (ref 0.3–1.2)
Total Protein: 7.2 g/dL (ref 6.5–8.1)

## 2017-02-15 LAB — APTT: APTT: 33 s (ref 24–36)

## 2017-02-15 LAB — I-STAT TROPONIN, ED: TROPONIN I, POC: 0.66 ng/mL — AB (ref 0.00–0.08)

## 2017-02-15 LAB — CBC WITH DIFFERENTIAL/PLATELET
Basophils Absolute: 0 10*3/uL (ref 0.0–0.1)
Basophils Relative: 0 %
EOS ABS: 0.2 10*3/uL (ref 0.0–0.7)
EOS PCT: 2 %
HCT: 31.2 % — ABNORMAL LOW (ref 36.0–46.0)
Hemoglobin: 10.2 g/dL — ABNORMAL LOW (ref 12.0–15.0)
LYMPHS ABS: 1.7 10*3/uL (ref 0.7–4.0)
Lymphocytes Relative: 16 %
MCH: 28.3 pg (ref 26.0–34.0)
MCHC: 32.7 g/dL (ref 30.0–36.0)
MCV: 86.4 fL (ref 78.0–100.0)
MONOS PCT: 7 %
Monocytes Absolute: 0.7 10*3/uL (ref 0.1–1.0)
Neutro Abs: 8 10*3/uL — ABNORMAL HIGH (ref 1.7–7.7)
Neutrophils Relative %: 75 %
PLATELETS: 289 10*3/uL (ref 150–400)
RBC: 3.61 MIL/uL — AB (ref 3.87–5.11)
RDW: 14 % (ref 11.5–15.5)
WBC: 10.5 10*3/uL (ref 4.0–10.5)

## 2017-02-15 LAB — LIPID PANEL
CHOLESTEROL: 141 mg/dL (ref 0–200)
HDL: 53 mg/dL (ref >40)
LDL Cholesterol: 80 mg/dL (ref 0–99)
TRIGLYCERIDES: 40 mg/dL (ref <150)
Total CHOL/HDL Ratio: 2.7 RATIO
VLDL: 8 mg/dL (ref 0–40)

## 2017-02-15 LAB — BRAIN NATRIURETIC PEPTIDE: B Natriuretic Peptide: 382.8 pg/mL — ABNORMAL HIGH (ref 0.0–100.0)

## 2017-02-15 MED ORDER — HEPARIN BOLUS VIA INFUSION
3600.0000 [IU] | Freq: Once | INTRAVENOUS | Status: AC
  Administered 2017-02-15: 3600 [IU] via INTRAVENOUS
  Filled 2017-02-15: qty 3600

## 2017-02-15 MED ORDER — HEPARIN (PORCINE) IN NACL 100-0.45 UNIT/ML-% IJ SOLN
700.0000 [IU]/h | INTRAMUSCULAR | Status: DC
  Administered 2017-02-15: 700 [IU]/h via INTRAVENOUS
  Filled 2017-02-15 (×2): qty 250

## 2017-02-15 MED ORDER — HEPARIN SODIUM (PORCINE) 5000 UNIT/ML IJ SOLN: 60.0000 [IU]/kg | Freq: Once | INTRAMUSCULAR | Status: DC

## 2017-02-15 MED ORDER — SODIUM CHLORIDE 0.9 % IV SOLN
INTRAVENOUS | Status: DC
  Administered 2017-02-15: 23:00:00 via INTRAVENOUS

## 2017-02-15 MED ORDER — ASPIRIN 81 MG PO CHEW: 324.0000 mg | CHEWABLE_TABLET | Freq: Once | ORAL | Status: DC

## 2017-02-15 MED ORDER — ASPIRIN 81 MG PO CHEW
324.0000 mg | CHEWABLE_TABLET | Freq: Once | ORAL | Status: AC
  Administered 2017-02-15: 81 mg via ORAL
  Filled 2017-02-15: qty 4

## 2017-02-15 MED ORDER — LEVOFLOXACIN IN D5W 750 MG/150ML IV SOLN
750.0000 mg | Freq: Once | INTRAVENOUS | Status: AC
  Administered 2017-02-15: 750 mg via INTRAVENOUS
  Filled 2017-02-15: qty 150

## 2017-02-15 NOTE — Progress Notes (Signed)
ANTICOAGULATION CONSULT NOTE - Initial Consult  Pharmacy Consult for Heparin Indication: chest pain/ACS  Allergies  Allergen Reactions  . Aspirin Swelling    Can not take High dose Asprin (325)  . Atorvastatin Other (See Comments)    LEG CRAMPS  . Bisphosphonates     Contraindicated due to renal disease  . Ceftin [Cefuroxime]     RASH  . Penicillins     rash  . Shellfish Allergy     hives  . Simvastatin Other (See Comments)    LEG CRAMPS  . Zetia [Ezetimibe]     hives    Patient Measurements: Height: 5\' 5"  (165.1 cm) Weight: 132 lb (59.9 kg) IBW/kg (Calculated) : 57 Heparin Dosing Weight: 59.9kg  Vital Signs: Temp: 99.2 F (37.3 C) (11/20 2049) Temp Source: Oral (11/20 2049) BP: 105/45 (11/20 2215) Pulse Rate: 83 (11/20 2215)  Labs: Recent Labs    02/01/2017 2051 02/04/2017 2108  HGB 10.2* 10.2*  HCT 31.2* 30.0*  PLT 289  --   CREATININE 1.56* 1.50*    Estimated Creatinine Clearance: 23.8 mL/min (A) (by C-G formula based on SCr of 1.5 mg/dL (H)).   Medical History: Past Medical History:  Diagnosis Date  . Asthma    seasonal  . Breast cancer ([place]) 1991   right  . Carotid artery stenosis 08/2011   40-59% bilateral carotid stenosis by dopplers 08/2016  . CKD (chronic kidney disease), stage III ([place])    from [facility]-Dr. Deterding  . Complication of anesthesia   . Diabetes mellitus ([place])    type 2 w complications of proteinuria and retinopathy  . Difficulty sleeping    due to husbands recent death  . Hypercholesterolemia   . Hypertension   . Mild mitral regurgitation   . Neuropathy   . Osteoarthritis   . Pernicious anemia   . PONV (postoperative nausea and vomiting)   . RLS (restless legs syndrome)    /insomnia-hydroxizine works well..    Assessment: 89 pt complaining of chest pain. Pharmacy consulted to dose heparin.  No anticoagulation PTA,   Hgb 10.2, Hct 31.2, troponin 66   Goal of Therapy:  Heparin level 0.3-0.7 units/ml Monitor platelets by  anticoagulation protocol: Yes   Plan:  Give 3600 units bolus x 1 Start heparin infusion at 700 units/hr Continue to monitor H&H and platelets   F/U heparin level at 8hr and daily cbc and heparin level   Felicity Pellegrini  PharmD Candidate '19 01/31/2017 3:30 PM

## 2017-02-15 NOTE — Consult Note (Signed)
Admit date: 02/05/2017 Referring Physician  Dr. Tomi Bamberger Primary Cardiologist  Dr. Radford Pax Reason for Consultation  Chest pain  HPI: [name] is a 83 y.o. female who is being seen today for the evaluation of chest pain at the request of Dr. Marye Round.  The patient is an 83yo female with a history of mild to moderate AS, IDDM,  carotid disease, CKD stage III followed by Nephrology, COPD and HTN. No known CAD noted in the record. Last echo from 2014 with normal EF and grade 2 diastolic dysfunction.   She presented to the ER tonight with complaints of chest pain and SOB.  For the past month she has had progressive SOB with exertion and over the past week has had exertional CP.  She is a very poor historian and some of the history was obtained by her daughter.  She says over the past week she has had intermittent CP but cannot describe to me what it feels like.  It does seem to occur more with exertion and resolves with rest.  She saw her PCP today and was diagnosed with PNA.  She says that later today her grandson came over and she was looking for something for him.  She says she started to feel very fatigued and felt a tightness in her throat and neck and then had a syncopal episode for about 2 minutes when she came to she vomited.  She was brought to the emergency room by EMS.  In the ER the patient was noted to have marked ST depression on EKG with a millimeter of ST elevation in aVR.  The ER physician Dr. Marye Round discussed the EKG with Dr. Peter Martinique who felt this was not a STEMI.  Labs included aShe was looking for something for him creatinine of 1.5, troponin 0 0.66, BNP of 382.  Chest x-ray showed patchy infiltrative changes in the left mid lung and right upper lobe.  She was started empirically on IV antibiotics.  Tried hospitalists are admitting patient.  She had an episode of recurrent chest pain while down in the emergency room and repeat EKG showed slightly more ST depression in  the inferior lateral leads with persistence of 1 mm of ST elevation in aVR.  She currently is pain-free.  Cardiology is now asked to consult.     PMH:   Past Medical History:  Diagnosis Date  . Asthma    seasonal  . Breast cancer ([place]) 1991   right  . Carotid artery stenosis 08/2011   40-59% bilateral carotid stenosis by dopplers 08/2016  . CKD (chronic kidney disease), stage III ([place])    from [facility]-Dr. Deterding  . Complication of anesthesia   . Diabetes mellitus ([place])    type 2 w complications of proteinuria and retinopathy  . Difficulty sleeping    due to husbands recent death  . Hypercholesterolemia   . Hypertension   . Mild mitral regurgitation   . Neuropathy   . Osteoarthritis   . Pernicious anemia   . PONV (postoperative nausea and vomiting)   . RLS (restless legs syndrome)    /insomnia-hydroxizine works well..     PSH:   Past Surgical History:  Procedure Laterality Date  . MASTECTOMY     right  . right knee replacement    . right shoulder surgery    . TONSILLECTOMY    . TOTAL KNEE ARTHROPLASTY Left 07/22/2014   Procedure: LEFT TOTAL KNEE ARTHROPLASTY;  Surgeon: Gaynelle Arabian, MD;  Location: [facility];  Service: Orthopedics;  Laterality: Left;  also LMA    Allergies:  Aspirin; Atorvastatin; Bisphosphonates; Ceftin [cefuroxime]; Penicillins; Shellfish allergy; Simvastatin; and Zetia [ezetimibe] Prior to Admit Meds:   (Not in a hospital admission) Fam HX:    Family History  Problem Relation Age of Onset  . Emphysema Father   . Lymphoma Father    Social HX:    Social History   Socioeconomic History  . Marital status: Married    Spouse name: Not on file  . Number of children: 4  . Years of education: Not on file  . Highest education level: Not on file  Social Needs  . Financial resource strain: Not on file  . Food insecurity - worry: Not on file  . Food insecurity - inability: Not on file  . Transportation needs - medical: Not on file  . Transportation needs  - non-medical: Not on file  Occupational History  . Occupation: retired    Fish farm manager: RETIRED  Tobacco Use  . Smoking status: Never Smoker  . Smokeless tobacco: Never Used  Substance and Sexual Activity  . Alcohol use: No  . Drug use: No  . Sexual activity: Not Currently  Other Topics Concern  . Not on file  Social History Narrative  . Not on file     ROS:  All  ROS were addressed and are negative except what is stated in the HPI  Physical Exam: Blood pressure 113/61, pulse 89, temperature 99.2 F (37.3 C), temperature source Oral, resp. rate (!) 23, height 5\' 5"  (1.651 m), weight 132 lb (59.9 kg), SpO2 93 %.    General: Well developed, well nourished, in no acute distress Head: Eyes PERRLA, No xanthomas.   Normal cephalic and atramatic  Lungs:   Clear bilaterally to auscultation and percussion. Heart:   HRRR S1 S2 Pulses are 2+ & equal. 2/6 late peaking SM at the RUSB with no audible A2 component            No carotid bruit. No JVD.  No abdominal bruits. No femoral bruits. Abdomen: Bowel sounds are positive, abdomen soft and non-tender without masses or                  Hernia's noted. Msk:  Back normal, normal gait. Normal strength and tone for age. Extremities:   No clubbing, cyanosis or edema. Trace pulses Neuro: Alert and oriented X 3. Psych:  Good affect, responds appropriately    Labs:   Lab Results  Component Value Date   WBC 10.5 02/20/2017   HGB 10.2 (L) 02/10/2017   HCT 30.0 (L) 02/24/2017   MCV 86.4 02/08/2017   PLT 289 01/30/2017   Recent Labs  Lab 02/07/2017 2051 02/14/2017 2108  NA 135 137  K 4.4 4.4  CL 103 104  CO2 22  --   BUN 36* 39*  CREATININE 1.56* 1.50*  CALCIUM 8.9  --   PROT 7.2  --   BILITOT 1.1  --   ALKPHOS 59  --   ALT 15  --   AST 25  --   GLUCOSE 115* 118*   No results found for: PTT Lab Results  Component Value Date   INR 1.14 02/12/2017   INR 1.09 07/15/2014   No results found for: CKTOTAL, CKMB, CKMBINDEX, TROPONINI     Lab Results  Component Value Date   CHOL 141 02/25/2017   Lab Results  Component Value Date   HDL 53 02/11/2017  Lab Results  Component Value Date   LDLCALC 80 01/27/2017   Lab Results  Component Value Date   TRIG 40 02/16/2017   Lab Results  Component Value Date   CHOLHDL 2.7 02/12/2017   No results found for: LDLDIRECT    Radiology:  Dg Chest 2 View  Result Date: 01/31/2017 CLINICAL DATA:  Increased shortness of breath and cough EXAM: CHEST  2 VIEW COMPARISON:  Film from earlier in the same day. FINDINGS: Cardiac shadows within normal limits. Patchy infiltrative changes are again noted in the left mid lung and right upper lobe stable from the recent exam. No new focal infiltrate or sizable effusion is seen. Postsurgical changes on the right are again noted. No acute bony abnormality is seen. IMPRESSION: Stable bilateral infiltrates. Electronically Signed   By: Inez Catalina M.D.   On: 02/12/2017 21:43   Dg Chest 2 View  Result Date: 02/05/2017 CLINICAL DATA:  Cough and shortness of breath for the past week. History of breast malignancy in the past. EXAM: CHEST  2 VIEW COMPARISON:  Chest x-ray of May 19, 2016 FINDINGS: The lungs are well-expanded. The interstitial markings are mildly increased with areas of patchy airspace opacity in the right upper lobe and in the left mid lung. There is a trace of blunting of the costophrenic angles on the right. The heart and pulmonary vascularity are normal. There is calcification in the wall of the aortic arch. The trachea is midline. The bony thorax exhibits no acute abnormality. There surgical clips in the left axillary region from previous right mastectomy and axillary lymph node dissection. IMPRESSION: Patchy airspace opacities in the right upper lobe and left mid lung worrisome for pneumonia. Small right pleural effusion. No pulmonary edema. Followup PA and lateral chest X-ray is recommended in 3-4 weeks following trial of antibiotic  therapy to ensure resolution and exclude underlying malignancy. Thoracic aortic atherosclerosis. Electronically Signed   By: David  Martinique M.D.   On: 02/21/2017 09:33     Telemetry    Normal sinus rhythm with ST depression- Personally Reviewed  ECG    Normal sinus rhythm with marked ST depression in the inferior lateral leads with 1 mm of ST elevation in aVR- Personally Reviewed   ASSESSMENT/PLAN:   1.  Unstable angina pectoris/NSTEMI  - Troponin is mildly elevated at 0.66. - Etiology of chest pain unclear at this time.  She has a murmur on exam that could represent severe aortic stenosis but could be the etiology of chest pain with a mildly elevated troponin.  She also has been diagnosed with pneumonia and therefore troponin could be mildly elevated due to acute pulmonary disease.  Given that she does have evidence of peripheral vascular disease and chest CT showing multiple coronary artery calcifications, she likely has underlying coronary disease.  What is more worrisome is the fact that she has 1 mm of ST elevation in aVR with marked ST depression throughout the inferior lateral leads which makes coronary ischemia more likely and possibly multivessel versus left main disease. - She is currently pain-free. - continue to cycle enzymes - IV Heparin gtt, ASA.  She is statin intolerant. - NO IV NTG until echo done to assess degree of AS - 2D echo in am to assess LVF and AS - case discussed with Dr. Martinique and Dr. Tomi Bamberger and Dr. Martinique felt EKG did not represent STEMI.  She is now pain free.  If she has reoccurrence of pain may need to go to cath lab  tonight.  Otherwise will make NPO after MN for cath in am. - hold Lasix for now  2.  PNA - per TRH  3.  Elevated BNP - suspect that this is related to her PNA.  She has no edema on exam but does have crackles at left base which likely is due to her PNA.  4.  PVD with carotid artery stenosis.  40-59% by doppler 08/2016.    5.  Hyperlipidemia  with LDL goal  < 70.  She is statin intolerant.  6.  HTN - BP is controlled. - continue amlodipine 10mg  daily and Valsartan 320mg  daily.       Fransico Him, MD  01/29/2017  11:04 PM

## 2017-02-15 NOTE — ED Notes (Signed)
Dr. Acquanetta Belling at bedside.

## 2017-02-15 NOTE — ED Provider Notes (Signed)
[facility] EMERGENCY DEPARTMENT Provider Note   CSN: [id number] Arrival date & time: 02/05/2017  2043     History   Chief Complaint Chief Complaint  Patient presents with  . Shortness of Breath  . Near Syncope    HPI [name] is a 83 y.o. female who presents for syncopal event. The patient states that today she was at home with her grandson. She went into another room to get something for her grandchild and said that she noticed she was getting weak and so she sat down in a chair.  She says that the next thing she knows she was waking up in her grandson was calling 911.  She says that she was diagnosed this week with bilateral pneumonia and is currently taking an antibiotic.  She is unsure what she is taking.  She has noticed a little bit of wheezing.  She also states that over the past month she has noticed pain in her chest with exertion that relieved after she sits for some time with rest.  She has a history of aortic stenosis and mitral valve regurg.  The patient also has history of diabetes, hyper cholesterolemia and hypertension.  She has known carotid artery disease.  At this time the patient denies any current chest pain or shortness of breath.  She does have a cough.  HPI  Past Medical History:  Diagnosis Date  . Asthma    seasonal  . Breast cancer ([place]) 1991   right  . Carotid artery stenosis 08/2011   40-59% bilateral carotid stenosis by dopplers 08/2016  . CKD (chronic kidney disease), stage III ([place])    from [facility]-Dr. Deterding  . Complication of anesthesia   . Diabetes mellitus ([place])    type 2 w complications of proteinuria and retinopathy  . Difficulty sleeping    due to husbands recent death  . Hypercholesterolemia   . Hypertension   . Mild mitral regurgitation   . Neuropathy   . Osteoarthritis   . Pernicious anemia   . PONV (postoperative nausea and vomiting)   . RLS (restless legs syndrome)    /insomnia-hydroxizine works well..     Patient Active Problem List   Diagnosis Date Noted  . OA (osteoarthritis) of knee 07/22/2014  . Chest pain 01/23/2014  . Wheezing 03/09/2013  . Hypertension   . Aortic valve disorders 03/08/2013  . Mitral regurgitation 03/08/2013  . Sinoatrial node dysfunction ([place]) 03/08/2013  . HBP (high blood pressure) 02/06/2013  . Cough 06/22/2012    Past Surgical History:  Procedure Laterality Date  . MASTECTOMY     right  . right knee replacement    . right shoulder surgery    . TONSILLECTOMY    . TOTAL KNEE ARTHROPLASTY Left 07/22/2014   Procedure: LEFT TOTAL KNEE ARTHROPLASTY;  Surgeon: Gaynelle Arabian, MD;  Location: [facility];  Service: Orthopedics;  Laterality: Left;  also LMA    OB History    No data available       Home Medications    Prior to Admission medications   Medication Sig Start Date End Date Taking? Authorizing Provider  acetaminophen (TYLENOL) 500 MG tablet Take 1,000 mg by mouth every 6 (six) hours as needed for moderate pain or headache.   Yes [provider]  ALPRAZolam (XANAX) 0.25 MG tablet Take 1 tablet by mouth 2 (two) times daily as needed for anxiety.  01/09/14  Yes [provider]  amLODipine (NORVASC) 10 MG tablet Take 10  mg by mouth at bedtime.    Yes [provider]  aspirin EC 81 MG tablet Take 81 mg by mouth at bedtime.    Yes [provider]  calcitRIOL (ROCALTROL) 0.25 MCG capsule Take 0.25 mcg by mouth every other day. 02/04/17  Yes [provider]  clonazePAM (KLONOPIN) 0.5 MG tablet Take 0.5 mg by mouth at bedtime. 01/14/17  Yes [provider]  doxycycline (MONODOX) 100 MG capsule Take 100 mg by mouth 2 (two) times daily. Started 02/10/17  Stopped 02/05/2017 02/10/17  Yes [provider]  furosemide (LASIX) 40 MG tablet Take 40 mg by mouth 2 (two) times daily. One tablet in the am and one at noon.   Yes [provider]  gabapentin (NEURONTIN) 100 MG capsule Take 200 mg by mouth  2 (two) times daily. Takes one capsule in the morning and two capsules at night.   Yes [provider]  hydroxypropyl methylcellulose / hypromellose (ISOPTO TEARS / GONIOVISC) 2.5 % ophthalmic solution Place 1 drop into both eyes as needed for dry eyes.   Yes [provider]  hydrOXYzine (ATARAX/VISTARIL) 25 MG tablet Take 25 mg by mouth daily as needed.   Yes [provider]  insulin NPH (NOVOLIN N) 100 UNIT/ML injection Inject 30 Units into the skin every morning.    Yes [provider]  latanoprost (XALATAN) 0.005 % ophthalmic solution Place 1 drop into both eyes at bedtime.  10/20/13  Yes [provider]  moxifloxacin (AVELOX) 400 MG tablet Take 400 mg by mouth daily at 8 pm.   Yes [provider]  Omega 3 1200 MG CAPS Take 1,200 mg by mouth 2 (two) times daily.   Yes [provider]  promethazine (PHENERGAN) 12.5 MG tablet Take 1 tablet (12.5 mg total) by mouth every 6 (six) hours as needed for nausea or vomiting. 07/25/14  Yes Perkins, Alexzandrew L, PA-C  HYDROcodone-acetaminophen (NORCO/VICODIN) 5-325 MG per tablet Take 1-2 tablets by mouth every 4 (four) hours as needed for moderate pain or severe pain. Patient not taking: Reported on 02/24/2017 07/25/14   Gaynelle Arabian, MD  methocarbamol (ROBAXIN) 500 MG tablet Take 1 tablet (500 mg total) by mouth every 6 (six) hours as needed for muscle spasms. Patient not taking: Reported on 01/30/2017 07/25/14   Dara Lords, Alexzandrew L, PA-C  rivaroxaban (XARELTO) 10 MG TABS tablet Take 1 tablet (10 mg total) by mouth daily with breakfast. Take Xarelto for two and a half more weeks, then discontinue Xarelto. Once the patient has completed the Xarelto, they may resume the 81 mg Aspirin. Patient not taking: Reported on 02/14/2017 07/25/14   Dara Lords, Alexzandrew L, PA-C  traMADol (ULTRAM) 50 MG tablet Take 1-2 tablets (50-100 mg total) by mouth every 6 (six) hours as needed (mild pain). Patient not  taking: Reported on 02/14/2017 07/25/14   Dara Lords, Alexzandrew L, PA-C  valsartan (DIOVAN) 320 MG tablet Take 1 tablet (320 mg total) by mouth daily. Patient not taking: Reported on 02/25/2017 07/03/14   Sueanne Margarita, MD    Family History Family History  Problem Relation Age of Onset  . Emphysema Father   . Lymphoma Father     Social History Social History   Tobacco Use  . Smoking status: Never Smoker  . Smokeless tobacco: Never Used  Substance Use Topics  . Alcohol use: No  . Drug use: No     Allergies   Aspirin; Atorvastatin; Bisphosphonates; Ceftin [cefuroxime]; Penicillins; Shellfish allergy; Simvastatin; and Zetia [ezetimibe]  Review of Systems Review of Systems Ten systems reviewed and are negative for acute change, except as noted in the HPI.    Physical Exam Updated Vital Signs BP 104/80   Pulse 86   Temp 99.2 F (37.3 C) (Oral)   Resp (!) 22   Ht 5\' 5"  (1.651 m)   Wt 59.9 kg (132 lb)   SpO2 93%   BMI 21.97 kg/m   Physical Exam  Constitutional: She is oriented to person, place, and time. She appears well-developed and well-nourished. No distress.  HENT:  Head: Normocephalic and atraumatic.  Eyes: Conjunctivae are normal. No scleral icterus.  Neck: Normal range of motion.  Cardiovascular: Normal rate, regular rhythm and normal heart sounds. Exam reveals no gallop and no friction rub.  No murmur heard. Pulmonary/Chest: Effort normal. No respiratory distress. She has wheezes in the right upper field. She has rhonchi in the right lower field and the left lower field.  Abdominal: Soft. Bowel sounds are normal. She exhibits no distension and no mass. There is no tenderness. There is no guarding.  Neurological: She is alert and oriented to person, place, and time.  Skin: Skin is warm and dry. Capillary refill takes less than 2 seconds. She is not diaphoretic.  Psychiatric: Her behavior is normal.  Nursing note and vitals reviewed.    ED Treatments /  Results  Labs (all labs ordered are listed, but only abnormal results are displayed) Labs Reviewed  COMPREHENSIVE METABOLIC PANEL - Abnormal; Notable for the following components:      Result Value   Glucose, Bld 115 (*)    BUN 36 (*)    Creatinine, Ser 1.56 (*)    Albumin 3.1 (*)    GFR calc non Af Amer 29 (*)    GFR calc Af Amer 33 (*)    All other components within normal limits  CBC WITH DIFFERENTIAL/PLATELET - Abnormal; Notable for the following components:   RBC 3.61 (*)    Hemoglobin 10.2 (*)    HCT 31.2 (*)    Neutro Abs 8.0 (*)    All other components within normal limits  BRAIN NATRIURETIC PEPTIDE - Abnormal; Notable for the following components:   B Natriuretic Peptide 382.8 (*)    All other components within normal limits  I-STAT CHEM 8, ED - Abnormal; Notable for the following components:   BUN 39 (*)    Creatinine, Ser 1.50 (*)    Glucose, Bld 118 (*)    Calcium, Ion 1.14 (*)    Hemoglobin 10.2 (*)    HCT 30.0 (*)    All other components within normal limits  I-STAT TROPONIN, ED - Abnormal; Notable for the following components:   Troponin i, poc 0.66 (*)    All other components within normal limits  PROTIME-INR  APTT  LIPID PANEL  HEPARIN LEVEL (UNFRACTIONATED)  CBC    EKG  EKG Interpretation  Date/Time:  Tuesday February 15 2017 20:46:49 EST Ventricular Rate:  81 PR Interval:    QRS Duration: 91 QT Interval:  323 QTC Calculation: 375 R Axis:   96 Text Interpretation:  Sinus rhythm Probable left atrial enlargement Right axis deviation Repol abnrm, severe global ischemia (LM/MVD) ischemic changes are new since last tracing Confirmed by Dorie Rank [phone]) on 01/29/2017 9:16:35 PM       Radiology Dg Chest 2 View  Result Date: 02/06/2017 CLINICAL DATA:  Increased shortness of breath and cough EXAM: CHEST  2 VIEW COMPARISON:  Film from earlier in the  same day. FINDINGS: Cardiac shadows within normal limits. Patchy infiltrative changes are again  noted in the left mid lung and right upper lobe stable from the recent exam. No new focal infiltrate or sizable effusion is seen. Postsurgical changes on the right are again noted. No acute bony abnormality is seen. IMPRESSION: Stable bilateral infiltrates. Electronically Signed   By: Inez Catalina M.D.   On: 02/18/2017 21:43   Dg Chest 2 View  Result Date: 02/24/2017 CLINICAL DATA:  Cough and shortness of breath for the past week. History of breast malignancy in the past. EXAM: CHEST  2 VIEW COMPARISON:  Chest x-ray of May 19, 2016 FINDINGS: The lungs are well-expanded. The interstitial markings are mildly increased with areas of patchy airspace opacity in the right upper lobe and in the left mid lung. There is a trace of blunting of the costophrenic angles on the right. The heart and pulmonary vascularity are normal. There is calcification in the wall of the aortic arch. The trachea is midline. The bony thorax exhibits no acute abnormality. There surgical clips in the left axillary region from previous right mastectomy and axillary lymph node dissection. IMPRESSION: Patchy airspace opacities in the right upper lobe and left mid lung worrisome for pneumonia. Small right pleural effusion. No pulmonary edema. Followup PA and lateral chest X-ray is recommended in 3-4 weeks following trial of antibiotic therapy to ensure resolution and exclude underlying malignancy. Thoracic aortic atherosclerosis. Electronically Signed   By: David  Martinique M.D.   On: 01/29/2017 09:33    Procedures .Critical Care Performed by: Margarita Mail, PA-C Authorized by: Margarita Mail, PA-C   Critical care provider statement:    Critical care time (minutes):  40   Critical care was necessary to treat or prevent imminent or life-threatening deterioration of the following conditions:  Cardiac failure   Critical care was time spent personally by me on the following activities:  Review of old charts, re-evaluation of patient's  condition, ordering and review of radiographic studies, ordering and performing treatments and interventions, blood draw for specimens, discussions with consultants, evaluation of patient's response to treatment, examination of patient and interpretation of cardiac output measurements   (including critical care time)  Medications Ordered in ED Medications  0.9 %  sodium chloride infusion ( Intravenous New Bag/Given 02/18/2017 2244)  aspirin chewable tablet 324 mg (324 mg Oral Not Given 02/13/2017 2227)  heparin ADULT infusion 100 units/mL (25000 units/275mL sodium chloride 0.45%) (700 Units/hr Intravenous New Bag/Given 02/13/2017 2244)  levofloxacin (LEVAQUIN) IVPB 750 mg (not administered)  aspirin chewable tablet 324 mg (81 mg Oral Given 02/05/2017 2224)  heparin bolus via infusion 3,600 Units (3,600 Units Intravenous Bolus from Bag 02/04/2017 2244)     Initial Impression / Assessment and Plan / ED Course  I have reviewed the triage vital signs and the nursing notes.  Pertinent labs & imaging results that were available during my care of the patient were reviewed by me and considered in my medical decision making (see chart for details).  Clinical Course as of Feb 16 2312  Tue Feb 15, 2017  2127 D/w Dr Martinique who revoewed the EKGs with me.  Will not activate code stemi.  Appears to be more global ischemia.  Treat as nonstemi  [JK]  2216 Patient with diffuse ST segment depression.  Patient just called out for CP and repeat EKG shows worsening  [AH]  2225 Pt complained of recurrent chest pain.  EKG appears worse, deepening t wave changes. Will consult  cardiology.  [JK]  2300 11:00 PM Patient being treated for NSTEMI with heparin. She is currently CP free. ? PE given hypoxia which is new in onset, however patient will likely need VQ.  Dr. Radford Pax states that they will consult on the patient and asks for hospitalist admission.  [AH]    Clinical Course User Index [AH] Margarita Mail, PA-C [JK]  Dorie Rank, MD      Final Clinical Impressions(s) / ED Diagnoses   Final diagnoses:  None    ED Discharge Orders    None       Margarita Mail, PA-C 02/12/2017 Warden Fillers, MD February 18, 2017 [phone]

## 2017-02-15 NOTE — ED Triage Notes (Signed)
Pt with recent diagnosis of bilateral pneumonia presents via EMS from home after an "unresponsive episode". Per pt, pt was walking around at home and "felt funny". Pt reports dizziness and states she sat down in a chair. Per EMS, pt's grandson who is a Airline pilot reported she was alert but not responding verbally to his questions. Pt A7&Ox4 at this time. Denies CP. Pt reports recent hx of SOB on exertion. Denies fever or chills. Denies diaphoresis, N/V, weakness, HA, or slurred speech. 20 G L AC. EMS VS: 126/60, 87HR, 19 RR, CBG 182, 95% on 3L. Pt 95% on RA at this time. EMS EKG with ST depression

## 2017-02-15 NOTE — ED Notes (Addendum)
Pt O2 sat dropped to approx 89-90% on RA. Pt placed on 3L [place]. Pt O2 sat improved to 95%.

## 2017-02-15 NOTE — ED Notes (Signed)
Heparin verified with Larena Glassman, RN

## 2017-02-15 NOTE — ED Notes (Addendum)
Pt transported to XR.  

## 2017-02-15 NOTE — ED Notes (Signed)
Pt called out and reports sudden onset central CP 3/10. No radiation. Repeat EKG taken. Vernie Shanks, [place] notified. Will notify Dr. Tomi Bamberger.

## 2017-02-15 NOTE — ED Notes (Signed)
ED Provider at bedside. 

## 2017-02-15 NOTE — ED Notes (Signed)
Dr. Tomi Bamberger given EKG

## 2017-02-15 NOTE — ED Notes (Signed)
Pt reports L sided groin cramping, EDP aware.

## 2017-02-15 NOTE — ED Notes (Addendum)
EDP aware pt only able to take 81mg  of aspirin.

## 2017-02-15 NOTE — ED Provider Notes (Signed)
She presented to the emergency room for evaluation of chest pain and shortness of breath.  Patient was recently diagnosed with pneumonia.  While she was at home this evening she started to become more short of breath.  Family also thought she had an episode of confusion.    In the emergency room the patient denied any active chest pain.  She was not feeling short of breath. Physical Exam  BP (!) 135/45   Pulse 81   Temp 99.2 F (37.3 C) (Oral)   Resp (!) 21   Ht 1.651 m (5\' 5" )   Wt 59.9 kg (132 lb)   SpO2 93%   BMI 21.97 kg/m   Physical Exam  Constitutional: No distress.  Elderly, frail  HENT:  Head: Normocephalic and atraumatic.  Right Ear: External ear normal.  Left Ear: External ear normal.  Eyes: Conjunctivae are normal. Right eye exhibits no discharge. Left eye exhibits no discharge. No scleral icterus.  Neck: Neck supple. No tracheal deviation present.  Cardiovascular: Normal rate.  Pulmonary/Chest: Effort normal. No stridor. No respiratory distress.  Abdominal: She exhibits no distension.  Musculoskeletal: She exhibits no edema.  Neurological: She is alert. Cranial nerve deficit: no gross deficits.  Skin: Skin is warm and dry. No rash noted.  Psychiatric: She has a normal mood and affect.  Nursing note and vitals reviewed.   ED Course  .Critical Care Performed by: Dorie Rank, MD Authorized by: Dorie Rank, MD   Critical care provider statement:    Critical care time (minutes):  30   Critical care was time spent personally by me on the following activities:  Discussions with consultants, evaluation of patient's response to treatment, examination of patient, ordering and performing treatments and interventions, ordering and review of laboratory studies, ordering and review of radiographic studies, pulse oximetry, re-evaluation of patient's condition, obtaining history from patient or surrogate and review of old charts     EKG Interpretation  Date/Time:  Tuesday  February 15 2017 20:46:49 EST Ventricular Rate:  81 PR Interval:    QRS Duration: 91 QT Interval:  323 QTC Calculation: 375 R Axis:   96 Text Interpretation:  Sinus rhythm Probable left atrial enlargement Right axis deviation Repol abnrm, severe global ischemia (LM/MVD) ischemic changes are new since last tracing Confirmed by Dorie Rank [phone]) on 02/04/2017 9:16:35 PM       Clinical Course as of Feb 15 2226  Tue Feb 15, 2017  2127 D/w Dr Martinique who revoewed the EKGs with me.  Will not activate code stemi.  Appears to be more global ischemia.  Treat as nonstemi  [JK]  2216 Patient with diffuse ST segment depression.  Patient just called out for CP and repeat EKG shows worsening  [AH]  2225 Pt complained of recurrent chest pain.  EKG appears worse, deepening t wave changes. Will consult cardiology.  [JK]    Clinical Course User Index [AH] Margarita Mail, PA-C [JK] Dorie Rank, MD    Patient presented to the emergency room with complaints of chest pain and shortness of breath.  Her EKG is concerning for cardiac ischemia.  I discussed the case with Dr. Martinique he recommended medical management initially.  Plan on starting on heparin.  I will consult with cardiology.  I am concerned about her worsening EKG changes.  Medical screening examination/treatment/procedure(s) were conducted as a shared visit with non-physician practitioner(s) and myself.  I personally evaluated the patient during the encounter.      Dorie Rank, MD 02/25/2017  2229  

## 2017-02-15 NOTE — ED Notes (Signed)
Pt back from XR 

## 2017-02-16 ENCOUNTER — Inpatient Hospital Stay (HOSPITAL_COMMUNITY): Payer: Medicare Other

## 2017-02-16 ENCOUNTER — Other Ambulatory Visit: Payer: Self-pay

## 2017-02-16 ENCOUNTER — Encounter (HOSPITAL_COMMUNITY): Admission: EM | Disposition: E | Payer: Self-pay | Source: Home / Self Care | Attending: Pulmonary Disease

## 2017-02-16 ENCOUNTER — Encounter (HOSPITAL_COMMUNITY): Payer: Self-pay | Admitting: Internal Medicine

## 2017-02-16 DIAGNOSIS — N183 Chronic kidney disease, stage 3 unspecified: Secondary | ICD-10-CM | POA: Diagnosis present

## 2017-02-16 DIAGNOSIS — J9601 Acute respiratory failure with hypoxia: Secondary | ICD-10-CM | POA: Diagnosis present

## 2017-02-16 DIAGNOSIS — I25110 Atherosclerotic heart disease of native coronary artery with unstable angina pectoris: Secondary | ICD-10-CM

## 2017-02-16 DIAGNOSIS — I214 Non-ST elevation (NSTEMI) myocardial infarction: Principal | ICD-10-CM

## 2017-02-16 DIAGNOSIS — I361 Nonrheumatic tricuspid (valve) insufficiency: Secondary | ICD-10-CM

## 2017-02-16 DIAGNOSIS — E1129 Type 2 diabetes mellitus with other diabetic kidney complication: Secondary | ICD-10-CM | POA: Diagnosis present

## 2017-02-16 DIAGNOSIS — J810 Acute pulmonary edema: Secondary | ICD-10-CM

## 2017-02-16 DIAGNOSIS — J189 Pneumonia, unspecified organism: Secondary | ICD-10-CM | POA: Diagnosis present

## 2017-02-16 DIAGNOSIS — I959 Hypotension, unspecified: Secondary | ICD-10-CM

## 2017-02-16 DIAGNOSIS — I350 Nonrheumatic aortic (valve) stenosis: Secondary | ICD-10-CM

## 2017-02-16 HISTORY — PX: RIGHT/LEFT HEART CATH AND CORONARY ANGIOGRAPHY: CATH118266

## 2017-02-16 LAB — BASIC METABOLIC PANEL
Anion gap: 10 (ref 5–15)
BUN: 40 mg/dL — ABNORMAL HIGH (ref 6–20)
CHLORIDE: 103 mmol/L (ref 101–111)
CO2: 20 mmol/L — AB (ref 22–32)
Calcium: 8.3 mg/dL — ABNORMAL LOW (ref 8.9–10.3)
Creatinine, Ser: 1.84 mg/dL — ABNORMAL HIGH (ref 0.44–1.00)
GFR calc non Af Amer: 24 mL/min — ABNORMAL LOW (ref >60)
GFR, EST AFRICAN AMERICAN: 27 mL/min — AB (ref >60)
Glucose, Bld: 185 mg/dL — ABNORMAL HIGH (ref 65–99)
POTASSIUM: 4.7 mmol/L (ref 3.5–5.1)
SODIUM: 133 mmol/L — AB (ref 135–145)

## 2017-02-16 LAB — BLOOD GAS, ARTERIAL
ACID-BASE DEFICIT: 5.1 mmol/L — AB (ref 0.0–2.0)
BICARBONATE: 19.8 mmol/L — AB (ref 20.0–28.0)
Drawn by: 41977
FIO2: 60
LHR: 20 {breaths}/min
MECHVT: 450 mL
O2 SAT: 96.8 %
PEEP: 5 cmH2O
Patient temperature: 98.6
pCO2 arterial: 38.8 mmHg (ref 32.0–48.0)
pH, Arterial: 7.328 — ABNORMAL LOW (ref 7.350–7.450)
pO2, Arterial: 97.3 mmHg (ref 83.0–108.0)

## 2017-02-16 LAB — URINALYSIS, ROUTINE W REFLEX MICROSCOPIC
Bilirubin Urine: NEGATIVE
GLUCOSE, UA: NEGATIVE mg/dL
HGB URINE DIPSTICK: NEGATIVE
Ketones, ur: NEGATIVE mg/dL
LEUKOCYTES UA: NEGATIVE
Nitrite: NEGATIVE
PH: 5 (ref 5.0–8.0)
Protein, ur: NEGATIVE mg/dL
SPECIFIC GRAVITY, URINE: 1.024 (ref 1.005–1.030)

## 2017-02-16 LAB — POCT I-STAT 3, VENOUS BLOOD GAS (G3P V)
Acid-base deficit: 6 mmol/L — ABNORMAL HIGH (ref 0.0–2.0)
Bicarbonate: 21.1 mmol/L (ref 20.0–28.0)
O2 SAT: 72 %
PO2 VEN: 43 mmHg (ref 32.0–45.0)
TCO2: 22 mmol/L (ref 22–32)
pCO2, Ven: 45.7 mmHg (ref 44.0–60.0)
pH, Ven: 7.271 (ref 7.250–7.430)

## 2017-02-16 LAB — TROPONIN I: Troponin I: 19.79 ng/mL (ref <0.03)

## 2017-02-16 LAB — GLUCOSE, CAPILLARY
GLUCOSE-CAPILLARY: 185 mg/dL — AB (ref 65–99)
GLUCOSE-CAPILLARY: 118 mg/dL — AB (ref 65–99)
GLUCOSE-CAPILLARY: 108 mg/dL — AB (ref 65–99)
GLUCOSE-CAPILLARY: 86 mg/dL (ref 65–99)
Glucose-Capillary: 198 mg/dL — ABNORMAL HIGH (ref 65–99)
Glucose-Capillary: 105 mg/dL — ABNORMAL HIGH (ref 65–99)

## 2017-02-16 LAB — PHOSPHORUS: Phosphorus: 4.5 mg/dL (ref 2.5–4.6)

## 2017-02-16 LAB — CBC
HEMATOCRIT: 33.2 % — AB (ref 36.0–46.0)
HEMOGLOBIN: 10.6 g/dL — AB (ref 12.0–15.0)
MCH: 27.5 pg (ref 26.0–34.0)
MCHC: 31.9 g/dL (ref 30.0–36.0)
MCV: 86.2 fL (ref 78.0–100.0)
Platelets: 280 10*3/uL (ref 150–400)
RBC: 3.85 MIL/uL — ABNORMAL LOW (ref 3.87–5.11)
RDW: 13.9 % (ref 11.5–15.5)
WBC: 18.4 10*3/uL — ABNORMAL HIGH (ref 4.0–10.5)

## 2017-02-16 LAB — MRSA PCR SCREENING: MRSA BY PCR: NEGATIVE

## 2017-02-16 LAB — POCT ACTIVATED CLOTTING TIME: Activated Clotting Time: 136 seconds

## 2017-02-16 LAB — MAGNESIUM: Magnesium: 1.9 mg/dL (ref 1.7–2.4)

## 2017-02-16 LAB — POCT I-STAT 3, ART BLOOD GAS (G3+)
ACID-BASE DEFICIT: 5 mmol/L — AB (ref 0.0–2.0)
BICARBONATE: 21.2 mmol/L (ref 20.0–28.0)
O2 Saturation: 100 %
TCO2: 23 mmol/L (ref 22–32)
pCO2 arterial: 43.8 mmHg (ref 32.0–48.0)
pH, Arterial: 7.294 — ABNORMAL LOW (ref 7.350–7.450)
pO2, Arterial: 215 mmHg — ABNORMAL HIGH (ref 83.0–108.0)

## 2017-02-16 LAB — ECHOCARDIOGRAM COMPLETE
Height: 65 in
Weight: 2229.29 oz

## 2017-02-16 LAB — HEPARIN LEVEL (UNFRACTIONATED): Heparin Unfractionated: <0.1 IU/mL — ABNORMAL LOW (ref 0.30–0.70)

## 2017-02-16 LAB — STREP PNEUMONIAE URINARY ANTIGEN: Strep Pneumo Urinary Antigen: NEGATIVE

## 2017-02-16 LAB — CORTISOL: Cortisol, Plasma: 24.8 ug/dL

## 2017-02-16 LAB — PROCALCITONIN: Procalcitonin: 0.44 ng/mL

## 2017-02-16 LAB — LACTIC ACID, PLASMA: LACTIC ACID, VENOUS: 1.2 mmol/L (ref 0.5–1.9)

## 2017-02-16 SURGERY — RIGHT/LEFT HEART CATH AND CORONARY ANGIOGRAPHY
Anesthesia: LOCAL

## 2017-02-16 SURGERY — LEFT HEART CATH AND CORONARY ANGIOGRAPHY
Anesthesia: LOCAL

## 2017-02-16 MED ORDER — SODIUM CHLORIDE 0.9 % IV SOLN
INTRAVENOUS | Status: AC | PRN
  Administered 2017-02-16: 10 mL/h via INTRAVENOUS

## 2017-02-16 MED ORDER — VERAPAMIL HCL 2.5 MG/ML IV SOLN
INTRAVENOUS | Status: AC
  Filled 2017-02-16: qty 2

## 2017-02-16 MED ORDER — HEPARIN (PORCINE) IN NACL 2-0.9 UNIT/ML-% IJ SOLN
INTRAMUSCULAR | Status: AC | PRN
  Administered 2017-02-16: 1000 mL

## 2017-02-16 MED ORDER — MIDAZOLAM HCL 2 MG/2ML IJ SOLN
INTRAMUSCULAR | Status: DC | PRN
  Administered 2017-02-16: 1 mg via INTRAVENOUS
  Administered 2017-02-16: 2 mg via INTRAVENOUS

## 2017-02-16 MED ORDER — ONDANSETRON HCL 4 MG/2ML IJ SOLN: 4.0000 mg | Freq: Four times a day (QID) | INTRAMUSCULAR | Status: DC | PRN

## 2017-02-16 MED ORDER — FENTANYL CITRATE (PF) 100 MCG/2ML IJ SOLN
INTRAMUSCULAR | Status: AC
  Filled 2017-02-16: qty 2

## 2017-02-16 MED ORDER — SODIUM CHLORIDE 0.9 % IV BOLUS (SEPSIS): 500.0000 mL | Freq: Once | INTRAVENOUS | Status: DC

## 2017-02-16 MED ORDER — FENTANYL BOLUS VIA INFUSION
25.0000 ug | INTRAVENOUS | Status: DC | PRN
  Filled 2017-02-16: qty 25

## 2017-02-16 MED ORDER — NOREPINEPHRINE BITARTRATE 1 MG/ML IV SOLN
0.0000 ug/min | INTRAVENOUS | Status: DC
  Administered 2017-02-17: 4 ug/min via INTRAVENOUS
  Filled 2017-02-16 (×2): qty 4

## 2017-02-16 MED ORDER — FENTANYL CITRATE (PF) 100 MCG/2ML IJ SOLN
INTRAMUSCULAR | Status: AC
  Administered 2017-02-16 (×2): 50 ug
  Filled 2017-02-16: qty 2

## 2017-02-16 MED ORDER — DEXMEDETOMIDINE HCL IN NACL 400 MCG/100ML IV SOLN
0.4000 ug/kg/h | INTRAVENOUS | Status: DC
  Administered 2017-02-16: 0.4 ug/kg/h via INTRAVENOUS
  Filled 2017-02-16: qty 100

## 2017-02-16 MED ORDER — PANTOPRAZOLE SODIUM 40 MG IV SOLR
40.0000 mg | Freq: Every day | INTRAVENOUS | Status: DC
  Administered 2017-02-16: 40 mg via INTRAVENOUS
  Filled 2017-02-16: qty 40

## 2017-02-16 MED ORDER — ROSUVASTATIN CALCIUM 10 MG PO TABS
10.0000 mg | ORAL_TABLET | Freq: Every day | ORAL | Status: DC
  Administered 2017-02-16: 10 mg via ORAL
  Filled 2017-02-16: qty 1

## 2017-02-16 MED ORDER — CHLORHEXIDINE GLUCONATE CLOTH 2 % EX PADS
6.0000 | MEDICATED_PAD | Freq: Every day | CUTANEOUS | Status: DC
  Administered 2017-02-16: 6 via TOPICAL

## 2017-02-16 MED ORDER — HEPARIN (PORCINE) IN NACL 2-0.9 UNIT/ML-% IJ SOLN
INTRAMUSCULAR | Status: AC
  Filled 2017-02-16: qty 1000

## 2017-02-16 MED ORDER — NITROGLYCERIN 1 MG/10 ML FOR IR/CATH LAB
INTRA_ARTERIAL | Status: AC
  Filled 2017-02-16: qty 10

## 2017-02-16 MED ORDER — ONDANSETRON HCL 4 MG/2ML IJ SOLN
4.0000 mg | Freq: Four times a day (QID) | INTRAMUSCULAR | Status: DC | PRN
  Administered 2017-02-16: 4 mg via INTRAVENOUS
  Filled 2017-02-16: qty 2

## 2017-02-16 MED ORDER — LIDOCAINE HCL (PF) 1 % IJ SOLN
INTRAMUSCULAR | Status: AC
  Filled 2017-02-16: qty 30

## 2017-02-16 MED ORDER — ACETAMINOPHEN 650 MG RE SUPP
650.0000 mg | Freq: Four times a day (QID) | RECTAL | Status: DC | PRN
Start: 2017-02-16 — End: 2017-02-16

## 2017-02-16 MED ORDER — LIDOCAINE HCL (PF) 1 % IJ SOLN
INTRAMUSCULAR | Status: DC | PRN
  Administered 2017-02-16: 30 mL

## 2017-02-16 MED ORDER — MIDAZOLAM HCL 2 MG/2ML IJ SOLN
INTRAMUSCULAR | Status: AC
  Filled 2017-02-16: qty 2

## 2017-02-16 MED ORDER — SODIUM CHLORIDE 0.9 % IV SOLN: 250.0000 mL | INTRAVENOUS | Status: DC | PRN

## 2017-02-16 MED ORDER — CHLORHEXIDINE GLUCONATE 0.12% ORAL RINSE (MEDLINE KIT)
15.0000 mL | Freq: Two times a day (BID) | OROMUCOSAL | Status: DC
  Administered 2017-02-16: 15 mL via OROMUCOSAL

## 2017-02-16 MED ORDER — ORAL CARE MOUTH RINSE
15.0000 mL | Freq: Two times a day (BID) | OROMUCOSAL | Status: DC
  Administered 2017-02-16 (×2): 15 mL via OROMUCOSAL

## 2017-02-16 MED ORDER — INSULIN ASPART 100 UNIT/ML ~~LOC~~ SOLN
2.0000 [IU] | SUBCUTANEOUS | Status: DC
  Administered 2017-02-16: 4 [IU] via SUBCUTANEOUS

## 2017-02-16 MED ORDER — SODIUM CHLORIDE 0.9% FLUSH: 10.0000 mL | INTRAVENOUS | Status: DC | PRN

## 2017-02-16 MED ORDER — METOPROLOL TARTRATE 12.5 MG HALF TABLET
12.5000 mg | ORAL_TABLET | Freq: Two times a day (BID) | ORAL | Status: DC
  Filled 2017-02-16: qty 1

## 2017-02-16 MED ORDER — FENTANYL 2500MCG IN NS 250ML (10MCG/ML) PREMIX INFUSION: 25.0000 ug/h | INTRAVENOUS | Status: DC

## 2017-02-16 MED ORDER — FUROSEMIDE 10 MG/ML IJ SOLN
20.0000 mg | Freq: Once | INTRAMUSCULAR | Status: AC
  Administered 2017-02-16: 20 mg via INTRAVENOUS
  Filled 2017-02-16: qty 2

## 2017-02-16 MED ORDER — INSULIN ASPART 100 UNIT/ML ~~LOC~~ SOLN
0.0000 [IU] | Freq: Three times a day (TID) | SUBCUTANEOUS | Status: DC
Start: 2017-02-16 — End: 2017-02-16

## 2017-02-16 MED ORDER — MIDAZOLAM HCL 2 MG/2ML IJ SOLN
1.0000 mg | INTRAMUSCULAR | Status: DC | PRN
Start: 2017-02-16 — End: 2017-02-16
  Administered 2017-02-16: 1 mg via INTRAVENOUS

## 2017-02-16 MED ORDER — FENTANYL CITRATE (PF) 100 MCG/2ML IJ SOLN
50.0000 ug | INTRAMUSCULAR | Status: DC | PRN
  Administered 2017-02-16: 50 ug via INTRAVENOUS

## 2017-02-16 MED ORDER — IOPAMIDOL (ISOVUE-370) INJECTION 76%
INTRAVENOUS | Status: DC | PRN
  Administered 2017-02-16: 25 mL via INTRA_ARTERIAL

## 2017-02-16 MED ORDER — HEPARIN (PORCINE) IN NACL 100-0.45 UNIT/ML-% IJ SOLN
900.0000 [IU]/h | INTRAMUSCULAR | Status: DC
  Administered 2017-02-16: 700 [IU]/h via INTRAVENOUS
  Filled 2017-02-16: qty 250

## 2017-02-16 MED ORDER — PROMETHAZINE HCL 25 MG/ML IJ SOLN
12.5000 mg | Freq: Once | INTRAMUSCULAR | Status: AC
  Administered 2017-02-16: 12.5 mg via INTRAVENOUS
  Filled 2017-02-16: qty 1

## 2017-02-16 MED ORDER — SODIUM CHLORIDE 0.9% FLUSH: 3.0000 mL | INTRAVENOUS | Status: DC | PRN

## 2017-02-16 MED ORDER — SODIUM CHLORIDE 0.9 % IV BOLUS (SEPSIS)
500.0000 mL | Freq: Once | INTRAVENOUS | Status: AC
  Administered 2017-02-16 (×2): 250 mL via INTRAVENOUS

## 2017-02-16 MED ORDER — SODIUM CHLORIDE 0.9 % IV SOLN
0.5000 mg/h | INTRAVENOUS | Status: AC
  Administered 2017-02-16: 1 mg/h via INTRAVENOUS
  Filled 2017-02-16: qty 10

## 2017-02-16 MED ORDER — MIDAZOLAM HCL 2 MG/2ML IJ SOLN: 1.0000 mg | INTRAMUSCULAR | Status: DC | PRN

## 2017-02-16 MED ORDER — SODIUM CHLORIDE 0.9 % IV BOLUS (SEPSIS)
250.0000 mL | Freq: Once | INTRAVENOUS | Status: AC
  Administered 2017-02-16: 250 mL via INTRAVENOUS

## 2017-02-16 MED ORDER — ORAL CARE MOUTH RINSE
15.0000 mL | OROMUCOSAL | Status: DC
  Administered 2017-02-16 (×3): 15 mL via OROMUCOSAL

## 2017-02-16 MED ORDER — FENTANYL CITRATE (PF) 100 MCG/2ML IJ SOLN
INTRAMUSCULAR | Status: DC | PRN
  Administered 2017-02-16 (×2): 50 ug via INTRAVENOUS

## 2017-02-16 MED ORDER — LEVOFLOXACIN IN D5W 500 MG/100ML IV SOLN: 500.0000 mg | INTRAVENOUS | Status: DC

## 2017-02-16 MED ORDER — FUROSEMIDE 10 MG/ML IJ SOLN
40.0000 mg | Freq: Once | INTRAMUSCULAR | Status: AC
  Administered 2017-02-16: 40 mg via INTRAVENOUS
  Filled 2017-02-16: qty 4

## 2017-02-16 MED ORDER — FENTANYL CITRATE (PF) 100 MCG/2ML IJ SOLN: 50.0000 ug | Freq: Once | INTRAMUSCULAR | Status: AC

## 2017-02-16 MED ORDER — ACETAMINOPHEN 325 MG PO TABS: 650.0000 mg | ORAL_TABLET | Freq: Four times a day (QID) | ORAL | Status: DC | PRN

## 2017-02-16 MED ORDER — SODIUM CHLORIDE 0.9% FLUSH
10.0000 mL | Freq: Two times a day (BID) | INTRAVENOUS | Status: DC
  Administered 2017-02-16 (×2): 10 mL

## 2017-02-16 MED ORDER — ASPIRIN 81 MG PO CHEW
81.0000 mg | CHEWABLE_TABLET | Freq: Every day | ORAL | Status: DC
  Administered 2017-02-16: 81 mg via ORAL
  Filled 2017-02-16: qty 1

## 2017-02-16 MED ORDER — DEXTROSE 50 % IV SOLN
INTRAVENOUS | Status: AC
  Administered 2017-02-16: 25 mL
  Filled 2017-02-16: qty 50

## 2017-02-16 MED ORDER — SODIUM CHLORIDE 0.9 % IV SOLN
8.0000 mg | Freq: Four times a day (QID) | INTRAVENOUS | Status: DC | PRN
  Filled 2017-02-16: qty 4

## 2017-02-16 MED ORDER — FENTANYL 2500MCG IN NS 250ML (10MCG/ML) PREMIX INFUSION
0.0000 ug/h | INTRAVENOUS | Status: AC
Start: 2017-02-16 — End: 2017-02-16
  Administered 2017-02-16: 50 ug/h via INTRAVENOUS
  Filled 2017-02-16: qty 250

## 2017-02-16 MED ORDER — FENTANYL CITRATE (PF) 100 MCG/2ML IJ SOLN: 50.0000 ug | INTRAMUSCULAR | Status: DC | PRN

## 2017-02-16 MED ORDER — SODIUM CHLORIDE 0.9% FLUSH: 3.0000 mL | Freq: Two times a day (BID) | INTRAVENOUS | Status: DC

## 2017-02-16 SURGICAL SUPPLY — 13 items
CATH INFINITI 5FR MULTPACK ANG (CATHETERS) ×2 IMPLANT
CATH SWAN GANZ 7F STRAIGHT (CATHETERS) ×2 IMPLANT
ELECT DEFIB PAD ADLT CADENCE (PAD) ×2 IMPLANT
KIT ENCORE 26 ADVANTAGE (KITS) ×2 IMPLANT
KIT HEART LEFT (KITS) ×2 IMPLANT
PACK CARDIAC CATHETERIZATION (CUSTOM PROCEDURE TRAY) ×2 IMPLANT
SHEATH PINNACLE 6F 10CM (SHEATH) ×2 IMPLANT
SHEATH PINNACLE 7F 10CM (SHEATH) ×2 IMPLANT
SLEEVE REPOSITIONING LENGTH 30 (MISCELLANEOUS) ×2 IMPLANT
TRANSDUCER W/STOPCOCK (MISCELLANEOUS) ×2 IMPLANT
TUBING CIL FLEX 10 FLL-RA (TUBING) ×2 IMPLANT
WIRE EMERALD 3MM-J .025X260CM (WIRE) ×2 IMPLANT
WIRE EMERALD 3MM-J .035X150CM (WIRE) ×2 IMPLANT

## 2017-02-16 NOTE — Progress Notes (Signed)
  Echocardiogram 2D Echocardiogram has been performed.  Darlina Sicilian M 02/25/2017, 1:42 PM

## 2017-02-16 NOTE — Progress Notes (Signed)
ANTICOAGULATION CONSULT NOTE - Initial Consult  Pharmacy Consult for heparin Indication: diffuse 3VCAD awaiting surgical consult  Allergies  Allergen Reactions  . Aspirin Swelling    Can not take High dose Asprin (325)  . Atorvastatin Other (See Comments)    LEG CRAMPS  . Bisphosphonates     Contraindicated due to renal disease  . Ceftin [Cefuroxime]     RASH  . Penicillins     rash  . Shellfish Allergy     hives  . Simvastatin Other (See Comments)    LEG CRAMPS  . Zetia [Ezetimibe]     hives    Patient Measurements: Height: 5\' 5"  (165.1 cm) Weight: 132 lb (59.9 kg) IBW/kg (Calculated) : 57  Vital Signs: Temp: 101.6 F (38.7 C) (11/21 0048) Temp Source: Axillary (11/21 0048) BP: 132/60 (11/21 0330) Pulse Rate: 100 (11/21 0330)  Labs: Recent Labs    02/13/2017 2051 02/01/2017 2108  HGB 10.2* 10.2*  HCT 31.2* 30.0*  PLT 289  --   APTT 33  --   LABPROT 14.5  --   INR 1.14  --   CREATININE 1.56* 1.50*    Estimated Creatinine Clearance: 23.8 mL/min (A) (by C-G formula based on SCr of 1.5 mg/dL (H)).   Medical History: Past Medical History:  Diagnosis Date  . Asthma    seasonal  . Breast cancer ([place]) 1991   right  . Carotid artery stenosis 08/2011   40-59% bilateral carotid stenosis by dopplers 08/2016  . CKD (chronic kidney disease), stage III ([place])    from [facility]-Dr. Deterding  . Complication of anesthesia   . Diabetes mellitus ([place])    type 2 w complications of proteinuria and retinopathy  . Difficulty sleeping    due to husbands recent death  . Hypercholesterolemia   . Hypertension   . Mild mitral regurgitation   . Neuropathy   . Osteoarthritis   . Pernicious anemia   . PONV (postoperative nausea and vomiting)   . RLS (restless legs syndrome)    /insomnia-hydroxizine works well..    Medications:  Medications Prior to Admission  Medication Sig Dispense Refill Last Dose  . acetaminophen (TYLENOL) 500 MG tablet Take 1,000 mg by mouth every 6 (six)  hours as needed for moderate pain or headache.   02/14/2017 at prn  . ALPRAZolam (XANAX) 0.25 MG tablet Take 1 tablet by mouth 2 (two) times daily as needed for anxiety.    Past Month at prn  . amLODipine (NORVASC) 10 MG tablet Take 10 mg by mouth at bedtime.    02/14/2017 at Unknown time  . aspirin EC 81 MG tablet Take 81 mg by mouth at bedtime.    02/14/2017 at Unknown time  . calcitRIOL (ROCALTROL) 0.25 MCG capsule Take 0.25 mcg by mouth every other day.   Past Week at Unknown time  . clonazePAM (KLONOPIN) 0.5 MG tablet Take 0.5 mg by mouth at bedtime.   02/14/2017 at Unknown time  . doxycycline (MONODOX) 100 MG capsule Take 100 mg by mouth 2 (two) times daily. Started 02/10/17  Stopped 02/05/2017   02/10/2017 at Unknown time  . furosemide (LASIX) 40 MG tablet Take 40 mg by mouth 2 (two) times daily. One tablet in the am and one at noon.   02/02/2017 at Unknown time  . gabapentin (NEURONTIN) 100 MG capsule Take 200 mg by mouth 2 (two) times daily. Takes one capsule in the morning and two capsules at night.   02/08/2017 at Unknown time  .  hydroxypropyl methylcellulose / hypromellose (ISOPTO TEARS / GONIOVISC) 2.5 % ophthalmic solution Place 1 drop into both eyes as needed for dry eyes.   02/14/2017 at Unknown time  . hydrOXYzine (ATARAX/VISTARIL) 25 MG tablet Take 25 mg by mouth daily as needed.   02/14/2017 at Unknown time  . insulin NPH (NOVOLIN N) 100 UNIT/ML injection Inject 30 Units into the skin every morning.    02/23/2017 at Unknown time  . latanoprost (XALATAN) 0.005 % ophthalmic solution Place 1 drop into both eyes at bedtime.    02/14/2017 at Unknown time  . moxifloxacin (AVELOX) 400 MG tablet Take 400 mg by mouth daily at 8 pm.   02/11/2017 at Unknown time  . Omega 3 1200 MG CAPS Take 1,200 mg by mouth 2 (two) times daily.   02/14/2017 at Unknown time  . promethazine (PHENERGAN) 12.5 MG tablet Take 1 tablet (12.5 mg total) by mouth every 6 (six) hours as needed for nausea or vomiting. 60  tablet 0 unknown at prn  . HYDROcodone-acetaminophen (NORCO/VICODIN) 5-325 MG per tablet Take 1-2 tablets by mouth every 4 (four) hours as needed for moderate pain or severe pain. (Patient not taking: Reported on 02/03/2017) 80 tablet 0 Not Taking at Unknown time  . methocarbamol (ROBAXIN) 500 MG tablet Take 1 tablet (500 mg total) by mouth every 6 (six) hours as needed for muscle spasms. (Patient not taking: Reported on 01/30/2017) 80 tablet 0 Not Taking at Unknown time  . rivaroxaban (XARELTO) 10 MG TABS tablet Take 1 tablet (10 mg total) by mouth daily with breakfast. Take Xarelto for two and a half more weeks, then discontinue Xarelto. Once the patient has completed the Xarelto, they may resume the 81 mg Aspirin. (Patient not taking: Reported on 02/07/2017) 18 tablet 0 Not Taking at Unknown time  . traMADol (ULTRAM) 50 MG tablet Take 1-2 tablets (50-100 mg total) by mouth every 6 (six) hours as needed (mild pain). (Patient not taking: Reported on 02/01/2017) 60 tablet 1 Not Taking at Unknown time  . valsartan (DIOVAN) 320 MG tablet Take 1 tablet (320 mg total) by mouth daily. (Patient not taking: Reported on 02/19/2017) 90 tablet 1 Not Taking at Unknown time   Scheduled:  . furosemide  20 mg Intravenous Once  . insulin aspart  2-6 Units Subcutaneous Q4H  . metoprolol tartrate  12.5 mg Oral BID  . pantoprazole (PROTONIX) IV  40 mg Intravenous QHS  . rosuvastatin  10 mg Oral q1800   Infusions:  . sodium chloride      Assessment: 83yo female was started on heparin infusion for USAP vs NSTEMI, decompensated on admission to SDU and sent urgently to cath lab where diffuse 3VCAD was revealed but no clear culprit lesion, awaiting surgical consult, to resume heparin 8hr after sheath removal; sheath pulled at 0316 in lab.  Goal of Therapy:  Heparin level 0.3-0.7 units/ml Monitor platelets by anticoagulation protocol: Yes   Plan:  At 1115am will resume heparin gtt at 700 units/hr and monitor  heparin levels and CBC.  Wynona Neat, PharmD, BCPS  02/09/2017,3:58 AM

## 2017-02-16 NOTE — Progress Notes (Signed)
Paged to [facility] 10 for Patient very sick needed to go to cath lab but had to be relieved from respiratory distress first.  Spent time with daughter -had prayer, waited with her as family members came to be of support to her.   Patient had to have heart cath after settled down with breathing.  Doctor came and gave report.  I had prayer with family and offered to share with the on flloor chaplain to check in with them tomorrow if possible to see how things were going.  Sherle Poe )   Conard Novak, Chaplain    02/01/2017 0300  Clinical Encounter Type  Visited With Family  Visit Type Initial;Spiritual support;Other (Comment)  Referral From (patient quickly got very ill)  Consult/Referral To Chaplain  Spiritual Encounters  Spiritual Needs Prayer;Emotional  Stress Factors  Patient Stress Factors Health changes  Family Stress Factors Health changes

## 2017-02-16 NOTE — Progress Notes (Signed)
Received a page that cath lab was activated for this patient who needed an urgent cath.  I walked to [facility] to help the RNs.  When I arrived, patient was in distress.  Patient was profusely diaphoretic (drenched in sweat), very restless, + nausea, + dry heaving, + vomiting, coughing up pink thick secretions.  Sats were only 86% on 6L, I asked the patient be placed on NRB 15L and that RT be notified. Lung sounds: + crackles/rhonchi, poor air movement, RR in the mid 40s.    Patient was hypotensive prior to my arrival and received 250cc NS bolus, SBP improved to > 95, HR I the 100-120s. Cardiology MD was already en route.  TRH MD was briefly at the bedside.  Patient stated her chest pain was 4/10, became increasingly more restless, anxious, and had "impending doom" feeling and further decompensated from a respiratory perspective. I immediately had ELINK camera in and requested that ICU rounding team come to bedside right away.  Patient was peri-arrest and airway protection was needed emergently.  CCM MD arrived and CARDS MD arrived, immediately decided to emergently intubate patient, RSI performed, hemodynamics maintained, patient taken to CCL immediately.   EKG was done prior to my arrival, no acute changes per staff, patient + NSTEMI, was on heparin drip when I arrived.   RRT INTERVENTIONS: -- assisted in RSI during peri-arrest situation.   Daughter was bedside during the event, consented to intubation with CCM MD. Family was very supportive and chaplain was with family as well.    Start time 0112 End Time 205

## 2017-02-16 NOTE — Progress Notes (Addendum)
[facility] for heparin Indication: diffuse 3VCAD awaiting surgical consult  Allergies  Allergen Reactions  . Aspirin Swelling    Can not take High dose Asprin (325)  . Atorvastatin Other (See Comments)    LEG CRAMPS  . Bisphosphonates     Contraindicated due to renal disease  . Ceftin [Cefuroxime]     RASH  . Penicillins     rash  . Shellfish Allergy     hives  . Simvastatin Other (See Comments)    LEG CRAMPS  . Zetia [Ezetimibe]     hives    Patient Measurements: Height: 5\' 5"  (165.1 cm) Weight: 139 lb 5.3 oz (63.2 kg) IBW/kg (Calculated) : 57  Vital Signs: Temp: 100.2 F (37.9 C) (11/21 2000) Temp Source: Core (Comment) (11/21 1600) BP: 107/44 (11/21 2000) Pulse Rate: 88 (11/21 2000)  Labs: Recent Labs    02/25/2017 2051 02/14/2017 2108 02/08/2017 0441 02/03/2017 1100 01/29/2017 2015  HGB 10.2* 10.2* 10.6*  --   --   HCT 31.2* 30.0* 33.2*  --   --   PLT 289  --  280  --   --   APTT 33  --   --   --   --   LABPROT 14.5  --   --   --   --   INR 1.14  --   --   --   --   HEPARINUNFRC  --   --   --   --  <0.10*  CREATININE 1.56* 1.50* 1.84*  --   --   TROPONINI  --   --  19.79* >65.00*  --     Estimated Creatinine Clearance: 19.4 mL/min (A) (by C-G formula based on SCr of 1.84 mg/dL (H)).   Medical History: Past Medical History:  Diagnosis Date  . Asthma    seasonal  . Breast cancer ([place]) 1991   right  . Carotid artery stenosis 08/2011   40-59% bilateral carotid stenosis by dopplers 08/2016  . CKD (chronic kidney disease), stage III ([place])    from [facility]-Dr. Deterding  . Complication of anesthesia   . Diabetes mellitus ([place])    type 2 w complications of proteinuria and retinopathy  . Difficulty sleeping    due to husbands recent death  . Hypercholesterolemia   . Hypertension   . Mild mitral regurgitation   . Neuropathy   . Osteoarthritis   . Pernicious anemia   . PONV (postoperative nausea and vomiting)   . RLS  (restless legs syndrome)    /insomnia-hydroxizine works well..    Infusions:  . sodium chloride 250 mL (02/19/2017 1900)  . sodium chloride    . heparin 700 Units/hr (02/14/2017 1900)  . [START ON 03-15-2017] levofloxacin (LEVAQUIN) IV    . norepinephrine (LEVOPHED) Adult infusion      Assessment: 83yo female was started on heparin infusion for USAP vs NSTEMI, decompensated on admission to SDU and sent urgently to cath lab on 11/21 where diffuse 3VCAD was revealed but no clear culprit lesion, awaiting surgical consult on heparin at 700 units/hr -Initial heparin level < 0.1  Goal of Therapy:  Heparin level 0.3-0.7 units/ml Monitor platelets by anticoagulation protocol: Yes   Plan:  -Increase heparin to 900 units/hr -Heparin level in 8 hours and daily wth CBC daily  Hildred Laser, Pharm D 01/29/2017 9:31 PM

## 2017-02-16 NOTE — H&P (Signed)
History and Physical    [name] XIP:[id number] DOB: November 25, 1926 DOA: 01/30/2017  PCP: Mayra Neer, MD  Patient coming from: Home.  Chief Complaint: Loss of consciousness.  HPI: [name] is a 83 y.o. female with history of moderate aortic stenosis, COPD, hypertension, chronic kidney disease, diabetes mellitus was brought to the ER after patient had a syncopal episode at home.  As per the patient's daughter and patient patient has been having some shortness of breath with cough last 2 weeks and was prescribed a course of antibiotics doxycycline by primary care physician.  Despite taking which patient was still having symptoms and had gone to her PCP yesterday and was prescribed Avelox.  Following Avelox patient threw up 1 hour later.  Patient last evening around 7 PM had gone to help her grandson and picking up something when patient felt weak and had to sit on the recliner and lost consciousness for  minutes.  He did not have any incontinence of urine or tongue bite.  Patient over the last 1 month has been having exertional shortness of breath and chest pain.  ED Course: In the ER patient's EKG was showing diffuse ST depression with mildly elevated troponin.  Cardiology was consulted patient was placed on heparin.  Chest x-ray was showing possible infiltrates for which patient started on antibiotics.  At the time of my exam patient has become more short of breath and was complaining of chest pain.  Patient also has become hypotensive.  And is been having at least 2 episodes of nausea vomiting.  Review of Systems: As per HPI, rest all negative.   Past Medical History:  Diagnosis Date  . Asthma    seasonal  . Breast cancer ([place]) 1991   right  . Carotid artery stenosis 08/2011   40-59% bilateral carotid stenosis by dopplers 08/2016  . CKD (chronic kidney disease), stage III ([place])    from [facility]-Dr. Deterding  . Complication of anesthesia   . Diabetes mellitus ([place])    type  2 w complications of proteinuria and retinopathy  . Difficulty sleeping    due to husbands recent death  . Hypercholesterolemia   . Hypertension   . Mild mitral regurgitation   . Neuropathy   . Osteoarthritis   . Pernicious anemia   . PONV (postoperative nausea and vomiting)   . RLS (restless legs syndrome)    /insomnia-hydroxizine works well..    Past Surgical History:  Procedure Laterality Date  . MASTECTOMY     right  . right knee replacement    . right shoulder surgery    . TONSILLECTOMY    . TOTAL KNEE ARTHROPLASTY Left 07/22/2014   Procedure: LEFT TOTAL KNEE ARTHROPLASTY;  Surgeon: Gaynelle Arabian, MD;  Location: [facility];  Service: Orthopedics;  Laterality: Left;  also LMA     reports that  has never smoked. she has never used smokeless tobacco. She reports that she does not drink alcohol or use drugs.  Allergies  Allergen Reactions  . Aspirin Swelling    Can not take High dose Asprin (325)  . Atorvastatin Other (See Comments)    LEG CRAMPS  . Bisphosphonates     Contraindicated due to renal disease  . Ceftin [Cefuroxime]     RASH  . Penicillins     rash  . Shellfish Allergy     hives  . Simvastatin Other (See Comments)    LEG CRAMPS  . Zetia [Ezetimibe]     hives  Family History  Problem Relation Age of Onset  . Emphysema Father   . Lymphoma Father     Prior to Admission medications   Medication Sig Start Date End Date Taking? Authorizing Provider  acetaminophen (TYLENOL) 500 MG tablet Take 1,000 mg by mouth every 6 (six) hours as needed for moderate pain or headache.   Yes [provider]  ALPRAZolam (XANAX) 0.25 MG tablet Take 1 tablet by mouth 2 (two) times daily as needed for anxiety.  01/09/14  Yes [provider]  amLODipine (NORVASC) 10 MG tablet Take 10 mg by mouth at bedtime.    Yes [provider]  aspirin EC 81 MG tablet Take 81 mg by mouth at bedtime.    Yes [provider]  calcitRIOL (ROCALTROL) 0.25 MCG  capsule Take 0.25 mcg by mouth every other day. 02/04/17  Yes [provider]  clonazePAM (KLONOPIN) 0.5 MG tablet Take 0.5 mg by mouth at bedtime. 01/14/17  Yes [provider]  doxycycline (MONODOX) 100 MG capsule Take 100 mg by mouth 2 (two) times daily. Started 02/10/17  Stopped 02/08/2017 02/10/17  Yes [provider]  furosemide (LASIX) 40 MG tablet Take 40 mg by mouth 2 (two) times daily. One tablet in the am and one at noon.   Yes [provider]  gabapentin (NEURONTIN) 100 MG capsule Take 200 mg by mouth 2 (two) times daily. Takes one capsule in the morning and two capsules at night.   Yes [provider]  hydroxypropyl methylcellulose / hypromellose (ISOPTO TEARS / GONIOVISC) 2.5 % ophthalmic solution Place 1 drop into both eyes as needed for dry eyes.   Yes [provider]  hydrOXYzine (ATARAX/VISTARIL) 25 MG tablet Take 25 mg by mouth daily as needed.   Yes [provider]  insulin NPH (NOVOLIN N) 100 UNIT/ML injection Inject 30 Units into the skin every morning.    Yes [provider]  latanoprost (XALATAN) 0.005 % ophthalmic solution Place 1 drop into both eyes at bedtime.  10/20/13  Yes [provider]  moxifloxacin (AVELOX) 400 MG tablet Take 400 mg by mouth daily at 8 pm.   Yes [provider]  Omega 3 1200 MG CAPS Take 1,200 mg by mouth 2 (two) times daily.   Yes [provider]  promethazine (PHENERGAN) 12.5 MG tablet Take 1 tablet (12.5 mg total) by mouth every 6 (six) hours as needed for nausea or vomiting. 07/25/14  Yes Perkins, Alexzandrew L, PA-C  HYDROcodone-acetaminophen (NORCO/VICODIN) 5-325 MG per tablet Take 1-2 tablets by mouth every 4 (four) hours as needed for moderate pain or severe pain. Patient not taking: Reported on 01/28/2017 07/25/14   Gaynelle Arabian, MD  methocarbamol (ROBAXIN) 500 MG tablet Take 1 tablet (500 mg total) by mouth every 6 (six) hours as needed for muscle  spasms. Patient not taking: Reported on 02/21/2017 07/25/14   Dara Lords, Alexzandrew L, PA-C  rivaroxaban (XARELTO) 10 MG TABS tablet Take 1 tablet (10 mg total) by mouth daily with breakfast. Take Xarelto for two and a half more weeks, then discontinue Xarelto. Once the patient has completed the Xarelto, they may resume the 81 mg Aspirin. Patient not taking: Reported on 02/07/2017 07/25/14   Dara Lords, Alexzandrew L, PA-C  traMADol (ULTRAM) 50 MG tablet Take 1-2 tablets (50-100 mg total) by mouth every 6 (six) hours as needed (mild pain). Patient not taking: Reported on 01/28/2017 07/25/14   Dara Lords, Alexzandrew L, PA-C  valsartan (DIOVAN) 320 MG tablet Take 1 tablet (  320 mg total) by mouth daily. Patient not taking: Reported on 02/01/2017 07/03/14   Sueanne Margarita, MD    Physical Exam: Vitals:   02/24/2017 2315 01/31/2017 0021 02/22/2017 0048 01/31/2017 0052  BP: (!) 136/57  (!) 81/49   Pulse: 87  93   Resp: (!) 25  20 (!) 23  Temp:  (!) 101.5 F (38.6 C) (!) 101.6 F (38.7 C)   TempSrc:  Oral Axillary   SpO2: 94%  (!) 89% (!) 87%  Weight:      Height:          Constitutional: Moderately built and nourished. Vitals:   02/06/2017 2315 02/18/2017 0021 02/24/2017 0048 02/02/2017 0052  BP: (!) 136/57  (!) 81/49   Pulse: 87  93   Resp: (!) 25  20 (!) 23  Temp:  (!) 101.5 F (38.6 C) (!) 101.6 F (38.7 C)   TempSrc:  Oral Axillary   SpO2: 94%  (!) 89% (!) 87%  Weight:      Height:       Eyes: Anicteric no pallor. ENMT: No discharge from the ears eyes nose or mouth. Neck: No mass felt.  No neck rigidity. Respiratory: No rhonchi or crepitations. Cardiovascular: E9-H3 heard systolic murmur. Abdomen: Soft nontender bowel sounds present. Musculoskeletal: No edema. Skin: No rash. Neurologic: Alert awake oriented to time place and person.  Moves all extremities. Psychiatric: Appears normal.  Normal affect.   Labs on Admission: I have personally reviewed following labs and imaging  studies  CBC: Recent Labs  Lab 02/10/2017 2051 02/05/2017 2108  WBC 10.5  --   NEUTROABS 8.0*  --   HGB 10.2* 10.2*  HCT 31.2* 30.0*  MCV 86.4  --   PLT 289  --    Basic Metabolic Panel: Recent Labs  Lab 01/30/2017 2051 02/06/2017 2108  NA 135 137  K 4.4 4.4  CL 103 104  CO2 22  --   GLUCOSE 115* 118*  BUN 36* 39*  CREATININE 1.56* 1.50*  CALCIUM 8.9  --    GFR: Estimated Creatinine Clearance: 23.8 mL/min (A) (by C-G formula based on SCr of 1.5 mg/dL (H)). Liver Function Tests: Recent Labs  Lab 02/14/2017 2051  AST 25  ALT 15  ALKPHOS 59  BILITOT 1.1  PROT 7.2  ALBUMIN 3.1*   No results for input(s): LIPASE, AMYLASE in the last 168 hours. No results for input(s): AMMONIA in the last 168 hours. Coagulation Profile: Recent Labs  Lab 02/10/2017 2051  INR 1.14   Cardiac Enzymes: No results for input(s): CKTOTAL, CKMB, CKMBINDEX, TROPONINI in the last 168 hours. BNP (last 3 results) No results for input(s): PROBNP in the last 8760 hours. HbA1C: No results for input(s): HGBA1C in the last 72 hours. CBG: Recent Labs  Lab 02/12/2017 0105  GLUCAP 185*   Lipid Profile: Recent Labs    01/27/2017 2051  CHOL 141  HDL 53  LDLCALC 80  TRIG 40  CHOLHDL 2.7   Thyroid Function Tests: No results for input(s): TSH, T4TOTAL, FREET4, T3FREE, THYROIDAB in the last 72 hours. Anemia Panel: No results for input(s): VITAMINB12, FOLATE, FERRITIN, TIBC, IRON, RETICCTPCT in the last 72 hours. Urine analysis:    Component Value Date/Time   COLORURINE YELLOW 07/15/2014 1405   APPEARANCEUR CLEAR 07/15/2014 1405   LABSPEC 1.013 07/15/2014 1405   PHURINE 5.5 07/15/2014 1405   GLUCOSEU NEGATIVE 07/15/2014 1405   HGBUR NEGATIVE 07/15/2014 [place] 07/15/2014 [place] 07/15/2014  [place] 07/15/2014 1405   UROBILINOGEN 0.2 07/15/2014 1405   NITRITE NEGATIVE 07/15/2014 1405   LEUKOCYTESUR NEGATIVE 07/15/2014 1405   Sepsis  Labs: @LABRCNTIP (procalcitonin:4,lacticidven:4) )No results found for this or any previous visit (from the past 240 hour(s)).   Radiological Exams on Admission: Dg Chest 2 View  Result Date: 01/30/2017 CLINICAL DATA:  Increased shortness of breath and cough EXAM: CHEST  2 VIEW COMPARISON:  Film from earlier in the same day. FINDINGS: Cardiac shadows within normal limits. Patchy infiltrative changes are again noted in the left mid lung and right upper lobe stable from the recent exam. No new focal infiltrate or sizable effusion is seen. Postsurgical changes on the right are again noted. No acute bony abnormality is seen. IMPRESSION: Stable bilateral infiltrates. Electronically Signed   By: Inez Catalina M.D.   On: 02/09/2017 21:43   Dg Chest 2 View  Result Date: 02/14/2017 CLINICAL DATA:  Cough and shortness of breath for the past week. History of breast malignancy in the past. EXAM: CHEST  2 VIEW COMPARISON:  Chest x-ray of May 19, 2016 FINDINGS: The lungs are well-expanded. The interstitial markings are mildly increased with areas of patchy airspace opacity in the right upper lobe and in the left mid lung. There is a trace of blunting of the costophrenic angles on the right. The heart and pulmonary vascularity are normal. There is calcification in the wall of the aortic arch. The trachea is midline. The bony thorax exhibits no acute abnormality. There surgical clips in the left axillary region from previous right mastectomy and axillary lymph node dissection. IMPRESSION: Patchy airspace opacities in the right upper lobe and left mid lung worrisome for pneumonia. Small right pleural effusion. No pulmonary edema. Followup PA and lateral chest X-ray is recommended in 3-4 weeks following trial of antibiotic therapy to ensure resolution and exclude underlying malignancy. Thoracic aortic atherosclerosis. Electronically Signed   By: David  Martinique M.D.   On: 02/05/2017 09:33    EKG: Independently  reviewed.  Normal sinus rhythm with diffuse ST depression.  Assessment/Plan Principal Problem:   Non-ST elevation MI (NSTEMI) ([place]) Active Problems:   CAP (community acquired pneumonia)   Acute respiratory failure with hypoxia ([facility])   CKD (chronic kidney disease) stage 3, GFR 30-59 ml/min ([facility])   Type 2 diabetes mellitus with renal manifestations ([facility])   Aortic stenosis    1. Non-ST elevation MI -patient is placed on heparin and aspirin intolerant to statins.  Patient's blood pressures in the low normal so no beta blockers.  2. Acute respiratory failure with hypoxia -likely from a combination of CHF and possible pneumonia.  Blood cultures pro calcitonin level lactic acid levels have been ordered.  Patient is on empiric antibiotics.  Will check 2D echo. 3. Aortic stenosis -check 2D echo. 4. Diabetes mellitus type 2 -for now we will keep patient on sliding scale coverage. 5. Chronic kidney disease stage III creatinine appears to be at baseline. 6. History of hypertension presently patient mildly hypotensive.  At the time of my exam patient is having active chest pain.  I have consulted cardiology again.  Cardiologist is planning urgent cardiac cath.  This patient also became hypoxic and is vomiting I have consulted pulmonary critical care.  Patient probably will require intubation.   DVT prophylaxis: Heparin. Code Status: Full code. Family Communication: Patient's daughter. Disposition Plan: Home. Consults called: Cardiology and pulmonary. Admission status: Inpatient.   Rise Patience MD [facility] Pager [phone].  If 7PM-7AM, please contact night-coverage [email or web address] Password [email or web address]  02/04/2017, 1:19 AM

## 2017-02-16 NOTE — Progress Notes (Signed)
RT note- Ventilator changes by Dr. Nelda Marseille to wean.

## 2017-02-16 NOTE — Procedures (Signed)
Extubation Procedure Note  Patient Details:   Name: [name] DOB: Mar 06, 1927 MRN: [id number]   Airway Documentation:     Evaluation  O2 sats: stable throughout Complications: No apparent complications Patient did tolerate procedure well. Bilateral Breath Sounds: Clear, Diminished   Yes  Incentive spirometer performed 542ml  fio2 6l/min [place], sp02-91% BBs rhonchi  Revonda Standard 02/07/2017, 11:24 AM

## 2017-02-16 NOTE — Progress Notes (Signed)
RT NOTE:  RT assisted with intubation. Pt being transported to Cath Lab by [employer], RRT. Pt will go to 2H06, report called to [place], RRT.

## 2017-02-16 NOTE — Progress Notes (Addendum)
PULMONARY / CRITICAL CARE MEDICINE   Name: [name] MRN: [id number] DOB: 09-18-1926    ADMISSION DATE:  01/30/2017 CONSULTATION DATE:  02/13/2017  REFERRING MD:  Dr. Hal Hope   CHIEF COMPLAINT:  Chest Pain   HISTORY OF PRESENT ILLNESS:   83 year old female with PMH of Breast Cancer, Asthma, Carotid Artery Stenosis, CKD stage 3, DM, HTN, Mild Mitral Regurgitation, Anemia, COPD   Presents to ED on 11/20 after "unresponisve episode". Family reports that patient was alert but not responding to verbal questions after syncopal episode. Recent diagnosis of bilateral pneumonia. Upon arrival to ED patient is alert and oriented. Denies Chest Pain. EKG with ST depression. CXR with bilateral airspace opacities. Hemodynamically stable. At 2212 patient complaining of 3/10 chest pain. Heparin started and cardiology consulted. Admitted to Step-Down. While on unit patient with severe CP episode with hypotension and hypoxia. Patient was in significant respiratory distress, diaphoretic, tachycardic. Family at bedside wants everything done. Patient was immedialty Intubated. Taken to cath lab post intubation     SUBJECTIVE:   VITAL SIGNS: BP (!) 101/58   Pulse 81   Temp 99 F (37.2 C)   Resp 20   Ht 5\' 5"  (1.651 m)   Wt 139 lb 5.3 oz (63.2 kg)   SpO2 97%   BMI 23.19 kg/m   HEMODYNAMICS: PAP: (31-59)/(13-25) 49/25 CVP:  [3 mmHg-13 mmHg] 13 mmHg CO:  [4.6 L/min] 4.6 L/min CI:  [2.8 L/min/m2] 2.8 L/min/m2  VENTILATOR SETTINGS: Vent Mode: PRVC FiO2 (%):  [40 %-100 %] 40 % Set Rate:  [18 bmp-20 bmp] 20 bmp Vt Set:  [450 mL] 450 mL PEEP:  [5 cmH20] 5 cmH20 Plateau Pressure:  [7 cmH20-22 cmH20] 7 cmH20  INTAKE / OUTPUT: I/O last 3 completed shifts: In: 83.9 [I.V.:58.9; Other:25] Out: -   PHYSICAL EXAMINATION: General: Elderly white female sedated on ventilator no acute distress HEENT: MM pink/moist endotracheal tube connected to ventilator no JVD appreciated PSY: Dull  effect Neuro: Moves all extremities x4 to command CV: Heart sounds are regular heart rate is 92 not on pressure support PULM: Decreased breath sounds in the bases FiO2 is 40% KT:GYBW, non-tender, bsx4 active  Extremities: Cool lower extremities/dry, no edema edema  Skin: no rashes or lesions   LABS:  BMET Recent Labs  Lab 01/31/2017 2051 02/10/2017 2108 02/09/2017 0441  NA 135 137 133*  K 4.4 4.4 4.7  CL 103 104 103  CO2 22  --  20*  BUN 36* 39* 40*  CREATININE 1.56* 1.50* 1.84*  GLUCOSE 115* 118* 185*    Electrolytes Recent Labs  Lab 01/31/2017 2051 01/28/2017 0441  CALCIUM 8.9 8.3*  MG  --  1.9  PHOS  --  4.5    CBC Recent Labs  Lab 02/22/2017 2051 01/27/2017 2108 01/27/2017 0441  WBC 10.5  --  18.4*  HGB 10.2* 10.2* 10.6*  HCT 31.2* 30.0* 33.2*  PLT 289  --  280    Coag's Recent Labs  Lab 01/31/2017 2051  APTT 33  INR 1.14    Sepsis Markers Recent Labs  Lab 01/31/2017 0441  LATICACIDVEN 1.2  PROCALCITON 0.44    ABG Recent Labs  Lab 01/29/2017 0421  PHART 7.328*  PCO2ART 38.8  PO2ART 97.3    Liver Enzymes Recent Labs  Lab 02/07/2017 2051  AST 25  ALT 15  ALKPHOS 59  BILITOT 1.1  ALBUMIN 3.1*    Cardiac Enzymes Recent Labs  Lab 02/01/2017 0441  TROPONINI 19.79*  Glucose Recent Labs  Lab 02/04/2017 0105 02/13/2017 0544  GLUCAP 185* 198*    Imaging Dg Chest 2 View  Result Date: 02/03/2017 CLINICAL DATA:  Increased shortness of breath and cough EXAM: CHEST  2 VIEW COMPARISON:  Film from earlier in the same day. FINDINGS: Cardiac shadows within normal limits. Patchy infiltrative changes are again noted in the left mid lung and right upper lobe stable from the recent exam. No new focal infiltrate or sizable effusion is seen. Postsurgical changes on the right are again noted. No acute bony abnormality is seen. IMPRESSION: Stable bilateral infiltrates. Electronically Signed   By: Inez Catalina M.D.   On: 01/27/2017 21:43   Dg Chest 2  View  Result Date: 02/02/2017 CLINICAL DATA:  Cough and shortness of breath for the past week. History of breast malignancy in the past. EXAM: CHEST  2 VIEW COMPARISON:  Chest x-ray of May 19, 2016 FINDINGS: The lungs are well-expanded. The interstitial markings are mildly increased with areas of patchy airspace opacity in the right upper lobe and in the left mid lung. There is a trace of blunting of the costophrenic angles on the right. The heart and pulmonary vascularity are normal. There is calcification in the wall of the aortic arch. The trachea is midline. The bony thorax exhibits no acute abnormality. There surgical clips in the left axillary region from previous right mastectomy and axillary lymph node dissection. IMPRESSION: Patchy airspace opacities in the right upper lobe and left mid lung worrisome for pneumonia. Small right pleural effusion. No pulmonary edema. Followup PA and lateral chest X-ray is recommended in 3-4 weeks following trial of antibiotic therapy to ensure resolution and exclude underlying malignancy. Thoracic aortic atherosclerosis. Electronically Signed   By: David  Martinique M.D.   On: 02/21/2017 09:33   Dg Chest Port 1 View  Result Date: 02/23/2017 CLINICAL DATA:  Enteric tube and Swan-Ganz catheter placements. EXAM: PORTABLE CHEST 1 VIEW COMPARISON:  02/05/2017 FINDINGS: Endotracheal tube with tip measuring 3.1 cm above the carina. Enteric tube tip is off the field of view but below the left hemidiaphragm. The proximal side hole appears to project over the expected location of the EG junction. A Swan-Ganz catheter has been placed via inferior approach. Tip overlies the left hilum. Heart size and pulmonary vascularity are normal. There are bilateral perihilar airspace infiltrates. This could represent edema or multifocal pneumonia. No blunting of costophrenic angles. No pneumothorax. Surgical clips in the right axilla. IMPRESSION: 1. Endotracheal tube and Swan-Ganz catheter  appear in satisfactory location. 2. Enteric tube tip is below the left hemidiaphragm but proximal side hole overlies the EG junction region. 3. Bilateral perihilar infiltrates, similar to previous study, suggesting edema or pneumonia. Electronically Signed   By: Lucienne Capers M.D.   On: 02/05/2017 04:52   Dg Chest Port 1 View  Result Date: 02/11/2017 CLINICAL DATA:  Intubation EXAM: PORTABLE CHEST 1 VIEW COMPARISON:  Chest radiograph 01/2027 FINDINGS: Endotracheal tube tip is in good position at the level of the clavicular heads. Cardiomediastinal contours are normal. Worsening bilateral airspace opacities. No pneumothorax or sizable pleural effusion. IMPRESSION: 1. Appropriate position of endotracheal tube. 2. Worsening bilateral airspace opacities, likely pulmonary edema. Electronically Signed   By: Ulyses Jarred M.D.   On: 02/18/2017 02:11     STUDIES:  CXR 11/20 > Patchy airspace opacities in the right upper lobe and left mid lung worrisome for pneumonia. Small right pleural effusion. No pulmonary edema. 02/11/2017 right and left heart catheterization per Dr.  [name] Martinique three-vessel obstructive coronary disease with diffuse calcification mild pulmonary hypertension good cardiac output Chest x-ray 02/09/2017 bilateral pulmonary infiltrates lower  lobes, support apparatus in adequate position  CULTURES: Blood 11/21 >> Sputum 11/21 >> S. Pneumoniae 11/21 >> U/A 11/21 >>  ANTIBIOTICS: Levaquin 11/21  SIGNIFICANT EVENTS: 11/20 > Presents to ED   LINES/TUBES: ETT 11/21 >> 02/04/2017 right femoral PA catheter>>  DISCUSSION: 83 year old female presents with syncope and chest pain, intubated and taken to cath lab.   ASSESSMENT / PLAN:  PULMONARY A: Acute Hypoxic Respiratory Failure in setting of CAP  vs Pulmonary Edema secondary to acute heart failure  H/O COPD CXR with bilateral airspace disease  P:   Vent Support  VAP Bundle  Trend CXR/ABG chest x-ray reveals bilateral  lower lobe infiltrate She may be extubated able there is a concern for aspiration pneumonia in a frail 83 year old.  We will use Precedex for sedation decrease fentanyl.  Continue Levaquin for presumed aspiration pneumonia.  Evaluate later in the day for possible extubation.  CARDIOVASCULAR A:  Unstable Angina/NSTEMI  Diastolic HF (T6YB)  H/O HTN, HLD, Aortic Stenosis P:  Cardiology Following >.3 v non obstructive dz per cath Not requiring pressors at this time.  We will try to avoid pressors if possible.  Will use Precedex for tube tolerance to decrease fentanyl usage and hopefully avoid hypotension Cardiac Monitoring  Maintain MAP > 65  Trend Troponin last recorded troponin I 9.78 Continue Heparin gtt  Hold Lasix with rising creatinine and CVP of 5   RENAL Lab Results  Component Value Date   CREATININE 1.84 (H) 02/06/2017   CREATININE 1.50 (H) 01/27/2017   CREATININE 1.56 (H) 02/10/2017    A:   CKD Stage 3  P:   Trend BMP  Replace electrolytes as indicated  Hold further diuresis note her CVP is 5 02/01/2017  GASTROINTESTINAL A:   Stress Ulcer Prophylaxis   P:   NPO  PPI If not extubated 02/23/2017 we will start tube feeds  HEMATOLOGIC Recent Labs    02/07/2017 2108 01/31/2017 0441  HGB 10.2* 10.6*    A:   On Heparin for NSTEMI  Anemia of Chronic Illness  P:  Trend CBC   INFECTIOUS A:   Concern for CAP (does not appear infectious) vs pulmonary edema given bilateral opacities. Chest x-ray is consistent with bilateral lower lobe infiltrates pro-calcitonin is 0.44 WBC of 18.4 fever of 101  -Given Levaquin in ED. Patient symptoms have been going on for one month and slowly progressed over the past one week which goes against infectious etiology  Did spike fever of 101.0 P:   Continue Levaquin for fever 101 and white count of 18.4 and presumed aspiration pneumonia  ENDOCRINE CBG (last 3)  Recent Labs    02/10/2017 0105 02/21/2017 0544  GLUCAP 185* 198*     A:   DM   P:   Trend Glucose  SSI  NEUROLOGIC A:   Sedation needs  H/O Neuropathy  Pain and tube tolerance P:   RASS goal: 0/-1 PRN Fentanyl to achieve RASS 02/24/2017 we will start Precedex to assist in weaning from ventilatory support.  May need to resume her Klonopin and Xanax to prevent withdrawal symptoms Hold Klonopin, Xanax but for now  FAMILY  - Updates: Family updated at bedside. Family wants everything done.  Extended discussion with daughters.  - Inter-disciplinary family meet or Palliative Care meeting due by:  02/23/2017   CC Time: 40 minutes  Richardson Landry  Minor ACNP Maryanna Shape PCCM Pager [phone] till 1 pm If no answer page [phone] 01/31/2017, 8:23 AM  Attending Note:  83 year old female CHF and pulmonary edema history who was intubated for that who is much more alert and interactive today on exam with relatively clear lungs.  Patient is following all commands.  I reviewed CXR myself, pulmonary edema noted with ETT is in good position.  Patient would get agitated with ETT in place and would be given sedation resulting in hypotension then given fluid for hypotension that worsens pulmonary edema and respiratory failure.  She is weaning relatively well this AM.  In order to break the cycle, will d/c sedation, allow to get agitated then extubate to break the cycle.  Family is aware of this thought process and is ok with reintubation is needs be.  In the meantime, hold diureses.  Use levophed if needed.  The patient is critically ill with multiple organ systems failure and requires high complexity decision making for assessment and support, frequent evaluation and titration of therapies, application of advanced monitoring technologies and extensive interpretation of multiple databases.   Critical Care Time devoted to patient care services described in this note is  60 Minutes. This time reflects time of care of this signee Dr Jennet Maduro. This critical care time does  not reflect procedure time, or teaching time or supervisory time of PA/NP/Med student/Med Resident etc but could involve care discussion time.  Rush Farmer, M.D. [facility] Pulmonary/Critical Care Medicine. Pager: [phone]. After hours pager: [phone].

## 2017-02-16 NOTE — Progress Notes (Signed)
Patient had another episode of severe CP with a drop in her SBP to the 80's followed by severe N/V.  Discussed with Dr. Martinique and recommend urgent cardiac cath to assess coronary anatomy.  Of note, patient had eliquis listed on her med sheet but she took that 2 years ago at the time of her knee surgery and is no longer on it.

## 2017-02-16 NOTE — Interval H&P Note (Signed)
History and Physical Interval Note:  02/24/2017 2:09 AM  [name]  has presented today for surgery, with the diagnosis of urgent  The various methods of treatment have been discussed with the patient and family. After consideration of risks, benefits and other options for treatment, the patient has consented to  Procedure(s): LEFT HEART CATH AND CORONARY ANGIOGRAPHY (N/A) as a surgical intervention .  The patient's history has been reviewed, patient examined, no change in status, stable for surgery.  I have reviewed the patient's chart and labs.  Questions were answered to the patient's satisfaction.    Cath Lab Visit (complete for each Cath Lab visit)  Clinical Evaluation Leading to the Procedure:   ACS: Yes.    Non-ACS:    Anginal Classification: CCS IV  Anti-ischemic medical therapy: Minimal Therapy (1 class of medications)  Non-Invasive Test Results: No non-invasive testing performed  Prior CABG: No previous CABG       [name] 02/03/2017 2:09 AM

## 2017-02-16 NOTE — Progress Notes (Signed)
150 cc of Fentanyl and 25 cc of Versed wasted in sink witnessed by Crystal. [place], Ardeth Sportsman

## 2017-02-16 NOTE — Consult Note (Signed)
PULMONARY / CRITICAL CARE MEDICINE   Name: ASPIN [name] MRN: [id number] DOB: 05-25-1926    ADMISSION DATE:  02/03/2017 CONSULTATION DATE:  02/04/2017  REFERRING MD:  Dr. Hal Hope   CHIEF COMPLAINT:  Chest Pain   HISTORY OF PRESENT ILLNESS:   83 year old female with PMH of Breast Cancer, Asthma, Carotid Artery Stenosis, CKD stage 3, DM, HTN, Mild Mitral Regurgitation, Anemia, COPD   Presents to ED on 11/20 after "unresponisve episode". Family reports that patient was alert but not responding to verbal questions after syncopal episode. Recent diagnosis of bilateral pneumonia. Upon arrival to ED patient is alert and oriented. Denies Chest Pain. EKG with ST depression. CXR with bilateral airspace opacities. Hemodynamically stable. At 2212 patient complaining of 3/10 chest pain. Heparin started and cardiology consulted. Admitted to Step-Down. While on unit patient with severe CP episode with hypotension and hypoxia. Patient was in significant respiratory distress, diaphoretic, tachycardic. Family at bedside wants everything done. Patient was immedialty Intubated. Taken to cath lab post intubation   PAST MEDICAL HISTORY :  She  has a past medical history of Asthma, Breast cancer ([place]) (1991), Carotid artery stenosis (08/2011), CKD (chronic kidney disease), stage III ([place]), Complication of anesthesia, Diabetes mellitus ([place]), Difficulty sleeping, Hypercholesterolemia, Hypertension, Mild mitral regurgitation, Neuropathy, Osteoarthritis, Pernicious anemia, PONV (postoperative nausea and vomiting), and RLS (restless legs syndrome).  PAST SURGICAL HISTORY: She  has a past surgical history that includes Mastectomy; right knee replacement; right shoulder surgery; Tonsillectomy; and Total knee arthroplasty (Left, 07/22/2014).  Allergies  Allergen Reactions  . Aspirin Swelling    Can not take High dose Asprin (325)  . Atorvastatin Other (See Comments)    LEG CRAMPS  . Bisphosphonates      Contraindicated due to renal disease  . Ceftin [Cefuroxime]     RASH  . Penicillins     rash  . Shellfish Allergy     hives  . Simvastatin Other (See Comments)    LEG CRAMPS  . Zetia [Ezetimibe]     hives    No current facility-administered medications on file prior to encounter.    Current Outpatient Medications on File Prior to Encounter  Medication Sig  . acetaminophen (TYLENOL) 500 MG tablet Take 1,000 mg by mouth every 6 (six) hours as needed for moderate pain or headache.  . ALPRAZolam (XANAX) 0.25 MG tablet Take 1 tablet by mouth 2 (two) times daily as needed for anxiety.   Marland Kitchen amLODipine (NORVASC) 10 MG tablet Take 10 mg by mouth at bedtime.   Marland Kitchen aspirin EC 81 MG tablet Take 81 mg by mouth at bedtime.   . calcitRIOL (ROCALTROL) 0.25 MCG capsule Take 0.25 mcg by mouth every other day.  . clonazePAM (KLONOPIN) 0.5 MG tablet Take 0.5 mg by mouth at bedtime.  Marland Kitchen doxycycline (MONODOX) 100 MG capsule Take 100 mg by mouth 2 (two) times daily. Started 02/10/17  Stopped 02/03/2017  . furosemide (LASIX) 40 MG tablet Take 40 mg by mouth 2 (two) times daily. One tablet in the am and one at noon.  . gabapentin (NEURONTIN) 100 MG capsule Take 200 mg by mouth 2 (two) times daily. Takes one capsule in the morning and two capsules at night.  . hydroxypropyl methylcellulose / hypromellose (ISOPTO TEARS / GONIOVISC) 2.5 % ophthalmic solution Place 1 drop into both eyes as needed for dry eyes.  . hydrOXYzine (ATARAX/VISTARIL) 25 MG tablet Take 25 mg by mouth daily as needed.  . insulin NPH (NOVOLIN N) 100 UNIT/ML injection  Inject 30 Units into the skin every morning.   . latanoprost (XALATAN) 0.005 % ophthalmic solution Place 1 drop into both eyes at bedtime.   . moxifloxacin (AVELOX) 400 MG tablet Take 400 mg by mouth daily at 8 pm.  . Omega 3 1200 MG CAPS Take 1,200 mg by mouth 2 (two) times daily.  . promethazine (PHENERGAN) 12.5 MG tablet Take 1 tablet (12.5 mg total) by mouth every 6 (six) hours  as needed for nausea or vomiting.  Marland Kitchen HYDROcodone-acetaminophen (NORCO/VICODIN) 5-325 MG per tablet Take 1-2 tablets by mouth every 4 (four) hours as needed for moderate pain or severe pain. (Patient not taking: Reported on 02/21/2017)  . methocarbamol (ROBAXIN) 500 MG tablet Take 1 tablet (500 mg total) by mouth every 6 (six) hours as needed for muscle spasms. (Patient not taking: Reported on 02/04/2017)  . rivaroxaban (XARELTO) 10 MG TABS tablet Take 1 tablet (10 mg total) by mouth daily with breakfast. Take Xarelto for two and a half more weeks, then discontinue Xarelto. Once the patient has completed the Xarelto, they may resume the 81 mg Aspirin. (Patient not taking: Reported on 02/14/2017)  . traMADol (ULTRAM) 50 MG tablet Take 1-2 tablets (50-100 mg total) by mouth every 6 (six) hours as needed (mild pain). (Patient not taking: Reported on 02/14/2017)  . valsartan (DIOVAN) 320 MG tablet Take 1 tablet (320 mg total) by mouth daily. (Patient not taking: Reported on 02/05/2017)    FAMILY HISTORY:  Her indicated that her mother is deceased. She indicated that her father is deceased.   SOCIAL HISTORY: She  reports that  has never smoked. she has never used smokeless tobacco. She reports that she does not drink alcohol or use drugs.  REVIEW OF SYSTEMS:   Unable to review   SUBJECTIVE:   VITAL SIGNS: BP (!) 98/57 (BP Location: Left Arm)   Pulse 100   Temp (!) 101.6 F (38.7 C) (Axillary)   Resp (!) 32   Ht 5\' 5"  (1.651 m)   Wt 59.9 kg (132 lb)   SpO2 (!) 88%   BMI 21.97 kg/m   HEMODYNAMICS:    VENTILATOR SETTINGS: Vent Mode: PRVC FiO2 (%):  [100 %] 100 % Set Rate:  [18 bmp] 18 bmp Vt Set:  [450 mL] 450 mL PEEP:  [5 cmH20] 5 cmH20 Plateau Pressure:  [22 cmH20] 22 cmH20  INTAKE / OUTPUT: No intake/output data recorded.  PHYSICAL EXAMINATION: General: elderly female appropriate for age  Very diaphoretic in significant distress  Unable to speak  coughing up red frothy  sputum Neuro:  EOMI PERRLA 5/5 muscle strength in all 4 ext  Cardiovascular:  s1s2 tachycardic  Lungs:  Poor air entry b/l significant wheezing b/l  Abdomen:  Soft non tender  Musculoskeletal:  +pulses in all 4 ext no lower ext edema noted  Skin:  intact  LABS:  BMET Recent Labs  Lab 02/18/2017 2051 02/09/2017 2108  NA 135 137  K 4.4 4.4  CL 103 104  CO2 22  --   BUN 36* 39*  CREATININE 1.56* 1.50*  GLUCOSE 115* 118*    Electrolytes Recent Labs  Lab 02/20/2017 2051  CALCIUM 8.9    CBC Recent Labs  Lab 02/20/2017 2051 02/24/2017 2108  WBC 10.5  --   HGB 10.2* 10.2*  HCT 31.2* 30.0*  PLT 289  --     Coag's Recent Labs  Lab 02/25/2017 2051  APTT 33  INR 1.14    Sepsis Markers No results for  input(s): LATICACIDVEN, PROCALCITON, O2SATVEN in the last 168 hours.  ABG No results for input(s): PHART, PCO2ART, PO2ART in the last 168 hours.  Liver Enzymes Recent Labs  Lab 01/29/2017 2051  AST 25  ALT 15  ALKPHOS 59  BILITOT 1.1  ALBUMIN 3.1*    Cardiac Enzymes No results for input(s): TROPONINI, PROBNP in the last 168 hours.  Glucose Recent Labs  Lab 02/20/2017 0105  GLUCAP 185*    Imaging Dg Chest 2 View  Result Date: 02/01/2017 CLINICAL DATA:  Increased shortness of breath and cough EXAM: CHEST  2 VIEW COMPARISON:  Film from earlier in the same day. FINDINGS: Cardiac shadows within normal limits. Patchy infiltrative changes are again noted in the left mid lung and right upper lobe stable from the recent exam. No new focal infiltrate or sizable effusion is seen. Postsurgical changes on the right are again noted. No acute bony abnormality is seen. IMPRESSION: Stable bilateral infiltrates. Electronically Signed   By: Inez Catalina M.D.   On: 02/24/2017 21:43   Dg Chest 2 View  Result Date: 02/09/2017 CLINICAL DATA:  Cough and shortness of breath for the past week. History of breast malignancy in the past. EXAM: CHEST  2 VIEW COMPARISON:  Chest x-ray of  May 19, 2016 FINDINGS: The lungs are well-expanded. The interstitial markings are mildly increased with areas of patchy airspace opacity in the right upper lobe and in the left mid lung. There is a trace of blunting of the costophrenic angles on the right. The heart and pulmonary vascularity are normal. There is calcification in the wall of the aortic arch. The trachea is midline. The bony thorax exhibits no acute abnormality. There surgical clips in the left axillary region from previous right mastectomy and axillary lymph node dissection. IMPRESSION: Patchy airspace opacities in the right upper lobe and left mid lung worrisome for pneumonia. Small right pleural effusion. No pulmonary edema. Followup PA and lateral chest X-ray is recommended in 3-4 weeks following trial of antibiotic therapy to ensure resolution and exclude underlying malignancy. Thoracic aortic atherosclerosis. Electronically Signed   By: David  Martinique M.D.   On: 01/30/2017 09:33     STUDIES:  CXR 11/20 > Patchy airspace opacities in the right upper lobe and left mid lung worrisome for pneumonia. Small right pleural effusion. No pulmonary edema.  CULTURES: Blood 11/21 >> Sputum 11/21 >> S. Pneumoniae 11/21 >> U/A 11/21 >>  ANTIBIOTICS: Levaquin 11/21  SIGNIFICANT EVENTS: 11/20 > Presents to ED   LINES/TUBES: ETT 11/21 >>   DISCUSSION: 83 year old female presents with syncope and chest pain, intubated and taken to cath lab.   ASSESSMENT / PLAN:  PULMONARY A: Acute Hypoxic Respiratory Failure in setting of CAP  vs Pulmonary Edema secondary to acute heart failure  H/O COPD CXR with bilateral airspace disease  P:   Vent Support  VAP Bundle  Trend CXR/ABG   CARDIOVASCULAR A:  Unstable Angina/NSTEMI  Diastolic HF (C7EL)  H/O HTN, HLD, Aortic Stenosis P:  Cardiology Following > Going for Cath now. Will follow up after cardiac cath. Not requiring pressors at this time. Large st depression on tele monitor.   Cardiac Monitoring  Maintain MAP > 65  Trend Troponin  Continue Heparin gtt  Hold Lasix   RENAL A:   CKD Stage 3  P:   Trend BMP  Replace electrolytes as indicated   GASTROINTESTINAL A:   Stress Ulcer Prophylaxis   P:   NPO  PPI  HEMATOLOGIC A:  On Heparin for NSTEMI  Anemia of Chronic Illness  P:  Trend CBC   INFECTIOUS A:   Concern for CAP (does not appear infectious) vs pulmonary edema given bilateral opacities WBC 10.5., Afebrile  -Given Levaquin in ED. Patient symptoms have been going on for one month and slowly progressed over the past one week which goes against infectious etiology  P:   Trend WBC and Fever Curve  Trend PCT  Follow Culture Data  Monitor off Antibiotics   ENDOCRINE A:   DM   P:   Trend Glucose  SSI  NEUROLOGIC A:   Sedation needs  H/O Neuropathy  P:   RASS goal: 0/-1 PRN Fentanyl to achieve RASS  Hold Klonopin, Xanax   FAMILY  - Updates: Family updated at bedside. Family wants everything done for now.   - Inter-disciplinary family meet or Palliative Care meeting due by:  02/23/2017   CC Time: 24 minutes  Hayden Pedro, AGACNP-BC [facility]  Pgr: [phone]  PCCM Pgr: [place] D[facility] Pager: [phone]

## 2017-02-16 NOTE — Care Management Note (Signed)
Case Management Note  Patient Details  Name: [name] MRN: [id number] Date of Birth: 01/12/1927   Subjective/Objective:  Form home presents with syncope and chest pain, intubated and taken to cath lab. Extubated today.  S/p left/ right heart cath , coronary angiography.  PCP listed is Serita Grammes and she has Medicare/BCBS.                      Action/Plan: NCM will follow for dc needs.   Expected Discharge Date:                  Expected Discharge Plan:     In-House Referral:     Discharge planning Services  CM Consult  Post Acute Care Choice:    Choice offered to:     DME Arranged:    DME Agency:     HH Arranged:    HH Agency:     Status of Service:  In process, will continue to follow  If discussed at Long Length of Stay Meetings, dates discussed:    Additional Comments:  Zenon Mayo, RN 02/11/2017, 12:25 PM

## 2017-02-16 NOTE — Progress Notes (Addendum)
Patient arrived to 2C10 via stretcher from ED.  Patient transferred to SD bed.  Monitors applied to patient and noted oxygen saturation to be dropping.  Oxygen via nasal cannula titrated to 6L.  Patient's sats 87-92%.  Patient had episode of emesis in route to room, and continues to complain of nausea.  In total, 4mg  IV zofran and 12.5mg  IV phenergan were given per orders.  Complains of chest pressure.  BP 81/49.  MD notified, orders for 249ml NS bolus obtained.  BP improved to 98/57 following bolus.  Patient then began becoming more anxious and restless. Rapid Response RN at bedside. Patient started coughing up bloody sputum.  It was decided that patient would be going to cath lab tonight.  Dr. Hal Hope at bedside consulted CCM who sent ground team to assess. Patient intubated at bedside to protect airway and taken to cath lab.  243mL NS bolus given at 0145.  75mcg Fentanyl IV given at 0146, 20mg  etomidate IV given at 0147.  An additional 58mcg Fentanyl IV given at 0152.  Daughter updated at bedside.

## 2017-02-16 NOTE — Progress Notes (Addendum)
Progress Note  Patient Name: [name] Date of Encounter: 02/06/2017  Primary Cardiologist: Dr. Radford Pax   Subjective   Patient presented to ED last night with chest pain associated with N/V and hypotension; EKG showed 1 lead 69m ST elevation; patient went into respiratory distress and required intubation prior to being taken urgently to cath lab which showed 3 vessel obstructive CAD with no clear culprit, as well as mod-severe AVS.  On evaluation, patient awake and alert, she is able to indicate that she does not have chest pain, trouble breathing, abd pain.   Inpatient Medications    Scheduled Meds: . aspirin  81 mg Oral Daily  . chlorhexidine gluconate (MEDLINE KIT)  15 mL Mouth Rinse BID  . insulin aspart  2-6 Units Subcutaneous Q4H  . mouth rinse  15 mL Mouth Rinse 10 times per day  . metoprolol tartrate  12.5 mg Oral BID  . pantoprazole (PROTONIX) IV  40 mg Intravenous QHS  . rosuvastatin  10 mg Oral q1800  . sodium chloride flush  3 mL Intravenous Q12H   Continuous Infusions: . sodium chloride 250 mL (02/10/2017 0600)  . sodium chloride 250 mL (02/12/2017 0600)  . fentaNYL infusion INTRAVENOUS 25 mcg/hr (02/21/2017 0732)  . heparin     PRN Meds: sodium chloride, sodium chloride, fentaNYL, fentaNYL (SUBLIMAZE) injection, fentaNYL (SUBLIMAZE) injection, midazolam, midazolam, sodium chloride flush   Vital Signs    Vitals:   02/21/2017 0630 02/05/2017 0645 02/03/2017 0700 02/04/2017 0739  BP: (!) 99/46 (!) 91/45    Pulse: 78 76 80   Resp: 20 20 20    Temp: 99.1 F (37.3 C) 99.1 F (37.3 C) 98.8 F (37.1 C)   TempSrc:      SpO2: 100% 100% 100% 100%  Weight:      Height:        Intake/Output Summary (Last 24 hours) at 02/10/2017 0814 Last data filed at 02/04/2017 0600 Gross per 24 hour  Intake 83.93 ml  Output -  Net 83.93 ml   Filed Weights   02/23/2017 2050 02/14/2017 0400  Weight: 132 lb (59.9 kg) 139 lb 5.3 oz (63.2 kg)    Telemetry    SR, PVCs with 4 beat  run of NSVT, PAC - Personally Reviewed  ECG    SR, ST depression in I,II, avF, V2-6; ST elevation in aVR - Personally Reviewed  Physical Exam   GEN: No acute distress.   Neck: No JVD Cardiac: RRR, no murmurs, rubs, or gallops. SGordy Councilmanin place through RF sheath Respiratory: ETT in place, Clear to auscultation bilaterally. GI: Soft, nontender, non-distended  MS: No edema; no deformity; RFA site with swan ganz cath; no hematoma  Neuro:  Nonfocal, awake and alert, able to answer appropriately  Psych: Normal affect   Labs    Chemistry Recent Labs  Lab 02/06/2017 2051 02/11/2017 2108 02/20/2017 0441  NA 135 137 133*  K 4.4 4.4 4.7  CL 103 104 103  CO2 22  --  20*  GLUCOSE 115* 118* 185*  BUN 36* 39* 40*  CREATININE 1.56* 1.50* 1.84*  CALCIUM 8.9  --  8.3*  PROT 7.2  --   --   ALBUMIN 3.1*  --   --   AST 25  --   --   ALT 15  --   --   ALKPHOS 59  --   --   BILITOT 1.1  --   --   GFRNONAA 29*  --  24*  GFRAA 33*  --  27*  ANIONGAP 10  --  10     Hematology Recent Labs  Lab 02/14/2017 2051 01/30/2017 2108 02/06/2017 0441  WBC 10.5  --  18.4*  RBC 3.61*  --  3.85*  HGB 10.2* 10.2* 10.6*  HCT 31.2* 30.0* 33.2*  MCV 86.4  --  86.2  MCH 28.3  --  27.5  MCHC 32.7  --  31.9  RDW 14.0  --  13.9  PLT 289  --  280    Cardiac Enzymes Recent Labs  Lab 02/10/2017 0441  TROPONINI 19.79*    Recent Labs  Lab 02/18/2017 2106  TROPIPOC 0.66*     BNP Recent Labs  Lab 02/03/2017 2051  BNP 382.8*     DDimer No results for input(s): DDIMER in the last 168 hours.   Radiology    Dg Chest 2 View  Result Date: 02/24/2017 CLINICAL DATA:  Increased shortness of breath and cough EXAM: CHEST  2 VIEW COMPARISON:  Film from earlier in the same day. FINDINGS: Cardiac shadows within normal limits. Patchy infiltrative changes are again noted in the left mid lung and right upper lobe stable from the recent exam. No new focal infiltrate or sizable effusion is seen. Postsurgical changes on  the right are again noted. No acute bony abnormality is seen. IMPRESSION: Stable bilateral infiltrates. Electronically Signed   By: Inez Catalina M.D.   On: 01/31/2017 21:43   Dg Chest 2 View  Result Date: 02/25/2017 CLINICAL DATA:  Cough and shortness of breath for the past week. History of breast malignancy in the past. EXAM: CHEST  2 VIEW COMPARISON:  Chest x-ray of May 19, 2016 FINDINGS: The lungs are well-expanded. The interstitial markings are mildly increased with areas of patchy airspace opacity in the right upper lobe and in the left mid lung. There is a trace of blunting of the costophrenic angles on the right. The heart and pulmonary vascularity are normal. There is calcification in the wall of the aortic arch. The trachea is midline. The bony thorax exhibits no acute abnormality. There surgical clips in the left axillary region from previous right mastectomy and axillary lymph node dissection. IMPRESSION: Patchy airspace opacities in the right upper lobe and left mid lung worrisome for pneumonia. Small right pleural effusion. No pulmonary edema. Followup PA and lateral chest X-ray is recommended in 3-4 weeks following trial of antibiotic therapy to ensure resolution and exclude underlying malignancy. Thoracic aortic atherosclerosis. Electronically Signed   By: David  Martinique M.D.   On: 02/02/2017 09:33   Dg Chest Port 1 View  Result Date: 02/01/2017 CLINICAL DATA:  Enteric tube and Swan-Ganz catheter placements. EXAM: PORTABLE CHEST 1 VIEW COMPARISON:  02/14/2017 FINDINGS: Endotracheal tube with tip measuring 3.1 cm above the carina. Enteric tube tip is off the field of view but below the left hemidiaphragm. The proximal side hole appears to project over the expected location of the EG junction. A Swan-Ganz catheter has been placed via inferior approach. Tip overlies the left hilum. Heart size and pulmonary vascularity are normal. There are bilateral perihilar airspace infiltrates. This  could represent edema or multifocal pneumonia. No blunting of costophrenic angles. No pneumothorax. Surgical clips in the right axilla. IMPRESSION: 1. Endotracheal tube and Swan-Ganz catheter appear in satisfactory location. 2. Enteric tube tip is below the left hemidiaphragm but proximal side hole overlies the EG junction region. 3. Bilateral perihilar infiltrates, similar to previous study, suggesting edema or pneumonia. Electronically Signed   By:  Lucienne Capers M.D.   On: 02/25/2017 04:52   Dg Chest Port 1 View  Result Date: 02/23/2017 CLINICAL DATA:  Intubation EXAM: PORTABLE CHEST 1 VIEW COMPARISON:  Chest radiograph 01/2027 FINDINGS: Endotracheal tube tip is in good position at the level of the clavicular heads. Cardiomediastinal contours are normal. Worsening bilateral airspace opacities. No pneumothorax or sizable pleural effusion. IMPRESSION: 1. Appropriate position of endotracheal tube. 2. Worsening bilateral airspace opacities, likely pulmonary edema. Electronically Signed   By: Ulyses Jarred M.D.   On: 02/04/2017 02:11    Cardiac Studies   R/L [facility] 41/32/4401:  LV end diastolic pressure is mildly elevated.  Hemodynamic findings consistent with mild pulmonary hypertension.  Prox LAD lesion is 80% stenosed.  Ost 1st Diag lesion is 80% stenosed.  Prox LAD to Mid LAD lesion is 70% stenosed.  Ost 1st Mrg lesion is 95% stenosed.  Prox Cx lesion is 90% stenosed.  Mid Cx to Dist Cx lesion is 70% stenosed.  Ost RCA to Prox RCA lesion is 75% stenosed.  Prox RCA to Mid RCA lesion is 80% stenosed. 1. 3 vessel obstructive CAD. Patient has diffuse calcific coronary disease with no clear cut culprit. 2. Moderate to severe aortic stenosis. AV peak gradient 30 mm Hg, mean gradient 25 mm Hg. AV area 1 cm squared. Index 0.64. 3. Mild pulmonary HTN.  4. Normal to mildly elevated LV filling pressures.  5. Good cardiac output.   Patient Profile     83 y.o. female with no known prior  cardiac disease presented to ED with chest pain, N/V, hypotension and respiratory distress which required intubation and urgent R/LHC - showed obstructive 3 vessel CAD and mod-severe AVS which may require combined CABG/AVR  Assessment & Plan    CAD/NSTEMI: S/p R/L cath overnight which showed obstructive 3 vessel CAD with no clear culprit, as well as mod-severe AVS which may have been contributing to patient's symptoms. Trop 0.66>19.79. Her hypotension resolved with intubation and hemodynamics have been stable; she has not required pressors; PCCM will try to switch to precedex from fentanyl to decrease affect on BP. CVP is 5. --heparin gtt --asa 57m daily, metoprolol 12.548mBID, rosuvastatin 1035maily, --trend troponins --f/u Echo --consider consult for CABG/AVR after stabilization with maximum med management  Possible aspiration pneumonia: CXR on admission and following reveal bilateral perihilar infiltrates which could represent multifocal pneumonia or edema. Procalcitonin is <0.5. New leukocytosis not present on admission and spiked fever to 101F. --resp and blood cultures have been drawn; step pneumo ag pending --Levaquin  AKI on CKD stage 3: Cr trending up to 1.8 this AM following cath (was 1.56 on admission); baseline is 1-1.2. Etiology likely combo of hypotension and contrast nephropathy. No urine output so far; bladder scan shows only 100. Will continue monitoring.  PVD with carotid artery stenosis: Last doppler in June 2018 showed bilateral stenosis of 40-59%.  HLD:  Had myalgias with atorva and simvastatin. Started on rosuvastatin 62m54mw.  HTN:  Stable. Started on metoprolol 12.5mg 92m.  Anemia: Patient has had persistent anemia since knee replacement surgery in 2016.   For questions or updates, please contact [facility] consult [email or web address] for contact info under Cardiology/STEMI.   Signed, GoricAlphonzo Grieve 02/20/2017, 8:14 AM    I have personally  seen and examined this patient with Dr. SvaliJari Favreree with the assessment and plan as outlined above. She is admitted with chest pain, dyspnea and hypotension. She was found to have a pneumonia. Given  EKG changes and hypotension, emergent cardiac cath last night and found to have severe three vessel CAD. She likely also has moderately severe to severe aortic stenosis. She was intubated prior to her cath but just extubated this am and is doing well. She has had no urine output following her cath. She is awake and alert and in NAD. [id number] murmur noted. Pulm:diffuse rhonci. Abd: soft, NT, ND, ext: no edema.  Labs reviewed: creat 1.84 EKG at 1am shows diffuse ST depression and elevation AVR (no newer EKG to review) Plan: She is admitted with acute respiratory distress with likely pneumonia with hypoxia and hypotension. She is found to have severe three vessel CAD and likely has severe AS. PCCM is managing her respiratory issues. She is on Levaquin.  Will diurese today.  Echo later today to assess LVEF and extent of valve disease Add ASA and statin.  BP too soft for a beta blocker currently Pending workup, she will need to be seen by CT surgery to discuss CABG and AVR. Given age, she may be a better candidate for multi-vessel stenting and TAVR.  Family updated.  60 minutes critical care time.   Lauree Chandler 02/22/2017 11:54 AM

## 2017-02-16 NOTE — Procedures (Signed)
Endotracheal Intubation Procedure Note  Indication for endotracheal intubation: impending respiratory failure. Airway Assessment: Mallampati Class: I (soft palate, uvula, fauces, and tonsillar pillars visible). Sedation: etomidate 20 mg and fentanyl 50 mg Paralytic: none. Lidocaine: no. Atropine: no. Equipment: Macintosh 4 laryngoscope blade.  7.5 ET  Cricoid Pressure: no. Number of attempts: 2. ETT location confirmed by by CXR.  Carlyon Prows 02/22/2017

## 2017-02-16 NOTE — H&P (View-Only) (Signed)
Patient had another episode of severe CP with a drop in her SBP to the 80's followed by severe N/V.  Discussed with Dr. Martinique and recommend urgent cardiac cath to assess coronary anatomy.  Of note, patient had eliquis listed on her med sheet but she took that 2 years ago at the time of her knee surgery and is no longer on it.

## 2017-02-17 ENCOUNTER — Inpatient Hospital Stay (HOSPITAL_COMMUNITY): Payer: Medicare Other

## 2017-02-17 LAB — CBC
HCT: 31.1 % — ABNORMAL LOW (ref 36.0–46.0)
HEMOGLOBIN: 10.4 g/dL — AB (ref 12.0–15.0)
MCH: 29.4 pg (ref 26.0–34.0)
MCHC: 33.4 g/dL (ref 30.0–36.0)
MCV: 87.9 fL (ref 78.0–100.0)
Platelets: 287 10*3/uL (ref 150–400)
RBC: 3.54 MIL/uL — AB (ref 3.87–5.11)
RDW: 14.3 % (ref 11.5–15.5)
WBC: 16.7 10*3/uL — ABNORMAL HIGH (ref 4.0–10.5)

## 2017-02-17 LAB — POCT I-STAT 3, ART BLOOD GAS (G3+)
Acid-base deficit: 11 mmol/L — ABNORMAL HIGH (ref 0.0–2.0)
Bicarbonate: 15.4 mmol/L — ABNORMAL LOW (ref 20.0–28.0)
O2 SAT: 86 %
TCO2: 17 mmol/L — AB (ref 22–32)
pCO2 arterial: 37.2 mmHg (ref 32.0–48.0)
pH, Arterial: 7.226 — ABNORMAL LOW (ref 7.350–7.450)
pO2, Arterial: 61 mmHg — ABNORMAL LOW (ref 83.0–108.0)

## 2017-02-17 LAB — BASIC METABOLIC PANEL
ANION GAP: 12 (ref 5–15)
BUN: 53 mg/dL — ABNORMAL HIGH (ref 6–20)
CHLORIDE: 108 mmol/L (ref 101–111)
CO2: 17 mmol/L — ABNORMAL LOW (ref 22–32)
Calcium: 8.2 mg/dL — ABNORMAL LOW (ref 8.9–10.3)
Creatinine, Ser: 2.75 mg/dL — ABNORMAL HIGH (ref 0.44–1.00)
GFR calc non Af Amer: 14 mL/min — ABNORMAL LOW (ref >60)
GFR, EST AFRICAN AMERICAN: 17 mL/min — AB (ref >60)
Glucose, Bld: 205 mg/dL — ABNORMAL HIGH (ref 65–99)
POTASSIUM: 4.9 mmol/L (ref 3.5–5.1)
SODIUM: 137 mmol/L (ref 135–145)

## 2017-02-17 LAB — COOXEMETRY PANEL
CARBOXYHEMOGLOBIN: 0.6 % (ref 0.5–1.5)
METHEMOGLOBIN: 1.2 % (ref 0.0–1.5)
O2 Saturation: 44.8 %
TOTAL HEMOGLOBIN: 10.4 g/dL — AB (ref 12.0–16.0)

## 2017-02-17 LAB — MAGNESIUM: MAGNESIUM: 2 mg/dL (ref 1.7–2.4)

## 2017-02-17 LAB — LACTIC ACID, PLASMA: Lactic Acid, Venous: 3.7 mmol/L (ref 0.5–1.9)

## 2017-02-17 LAB — GLUCOSE, CAPILLARY
GLUCOSE-CAPILLARY: 76 mg/dL (ref 65–99)
Glucose-Capillary: 181 mg/dL — ABNORMAL HIGH (ref 65–99)

## 2017-02-17 LAB — HEPARIN LEVEL (UNFRACTIONATED): Heparin Unfractionated: 0.3 IU/mL (ref 0.30–0.70)

## 2017-02-17 LAB — PHOSPHORUS: PHOSPHORUS: 6.3 mg/dL — AB (ref 2.5–4.6)

## 2017-02-17 LAB — TROPONIN I: TROPONIN I: 49.47 ng/mL — AB (ref <0.03)

## 2017-02-17 MED ORDER — PROMETHAZINE HCL 25 MG/ML IJ SOLN
12.5000 mg | INTRAMUSCULAR | Status: DC | PRN
  Administered 2017-02-17: 12.5 mg via INTRAVENOUS
  Filled 2017-02-17: qty 1

## 2017-02-17 MED ORDER — ALPRAZOLAM 0.25 MG PO TABS
0.2500 mg | ORAL_TABLET | Freq: Every evening | ORAL | Status: DC | PRN
  Administered 2017-02-17: 0.25 mg via ORAL
  Filled 2017-02-17: qty 1

## 2017-02-17 MED ORDER — IPRATROPIUM-ALBUTEROL 0.5-2.5 (3) MG/3ML IN SOLN
3.0000 mL | RESPIRATORY_TRACT | Status: DC | PRN
  Administered 2017-02-17: 3 mL via RESPIRATORY_TRACT
  Filled 2017-02-17: qty 3

## 2017-02-17 MED ORDER — PROMETHAZINE HCL 25 MG/ML IJ SOLN
12.5000 mg | Freq: Once | INTRAMUSCULAR | Status: AC
  Administered 2017-02-17: 12.5 mg via INTRAVENOUS
  Filled 2017-02-17: qty 1

## 2017-02-17 MED ORDER — FUROSEMIDE 10 MG/ML IJ SOLN
40.0000 mg | Freq: Once | INTRAMUSCULAR | Status: AC
  Administered 2017-02-17: 40 mg via INTRAVENOUS
  Filled 2017-02-17: qty 4

## 2017-02-17 MED ORDER — SODIUM BICARBONATE 8.4 % IV SOLN
50.0000 meq | Freq: Once | INTRAVENOUS | Status: AC
  Administered 2017-02-17: 50 meq via INTRAVENOUS
  Filled 2017-02-17: qty 50

## 2017-02-18 LAB — CULTURE, RESPIRATORY W GRAM STAIN: Culture: NORMAL

## 2017-02-18 LAB — CULTURE, RESPIRATORY

## 2017-02-21 LAB — CULTURE, BLOOD (ROUTINE X 2)
CULTURE: NO GROWTH
CULTURE: NO GROWTH
Special Requests: ADEQUATE
Special Requests: ADEQUATE

## 2017-02-22 ENCOUNTER — Telehealth: Payer: Self-pay

## 2017-02-22 NOTE — Telephone Encounter (Signed)
On 02/22/17 I received a d/c from [facility] [employer] (original). The d/c is for burial. The patient is a patient of Educational psychologist. The d/c will be taken to Pulmonary Unit @ [facility] tomorrow am for signature.  On 02/24/17 I received the d/c back from [facility]. I got the d/c ready and called the funeral home to let them know the d/c was mailed to vital records per the funeral home request.

## 2017-02-26 NOTE — Progress Notes (Addendum)
  Family in room.  Patient patient early this am. Condolences given

## 2017-02-26 NOTE — Progress Notes (Signed)
eLink Physician-Brief Progress Note Patient Name: [name] DOB: November 13, 1926 MRN: [id number]   Date of Service  03/09/2017  HPI/Events of Note  More sob, hypoxic Rhonchi per RT CXR shows increaed BL ASD ? edema  eICU Interventions  Duoneb Bipap prn PCWP 23 >> lasix 40 x 1      Intervention Category Intermediate Interventions: Respiratory distress - evaluation and management  Feiga Nadel V. Lylliana Kitamura 03-09-17, 2:07 AM

## 2017-02-26 NOTE — Progress Notes (Signed)
PCCM Interval Note   Patient retaining urine. Foley placed. Hypotensive with Cooxy Saturation 44.8 = Levophed started at 4 mcg. ABG 7.226/37.2/61 with pulmonary edema vs PNA on CXR. Placed on BiPAP and given 40 meq lasix since with 500 ml output.    Family states that if patient need to be intubated that is what they wish to do.   Hayden Pedro, AGACNP-BC [facility]  Pgr: [phone]  PCCM Pgr: [phone]

## 2017-02-26 NOTE — Progress Notes (Signed)
Lactic acid 3.7, Dr. Elsworth Soho aware.

## 2017-02-26 NOTE — Progress Notes (Signed)
Chaplain met with family and provided spiritual and grief support, including prayer.  Daughters are openly grieving.  The relatively unexpected and sudden nature of the patient's passing has left the family in shock.  More relatives are arriving; the farthest will arrive at about 9:30-10:00 AM.  Contact and funeral information given to RN and noted below. Please contact spiritual care if additional services are needed.  Minus Liberty, [place]  [facility] in [place], [place] (daughter) [phone] [place] [place], [place] [address]     February 19, 2017 0800  Clinical Encounter Type  Visited With Family  Visit Type Death;Spiritual support  Referral From Nurse  Consult/Referral To Chaplain  Spiritual Encounters  Spiritual Needs Prayer  Stress Factors  Family Stress Factors Major life changes

## 2017-02-26 NOTE — Progress Notes (Signed)
PULMONARY / CRITICAL CARE MEDICINE   Name: [name] MRN: [id number] DOB: May 06, 1926    ADMISSION DATE:  02/23/2017 CONSULTATION DATE:  02/07/2017  REFERRING MD:  Dr. Hal Hope   CHIEF COMPLAINT:  Chest Pain   HISTORY OF PRESENT ILLNESS:   83 year old female with PMH of Breast Cancer, Asthma, Carotid Artery Stenosis, CKD stage 3, DM, HTN, Mild Mitral Regurgitation, Anemia, COPD   Presents to ED on 11/20 after "unresponisve episode". Family reports that patient was alert but not responding to verbal questions after syncopal episode. Recent diagnosis of bilateral pneumonia. Upon arrival to ED patient is alert and oriented. Denies Chest Pain. EKG with ST depression. CXR with bilateral airspace opacities. Hemodynamically stable. At 2212 patient complaining of 3/10 chest pain. Heparin started and cardiology consulted. Admitted to Step-Down. While on unit patient with severe CP episode with hypotension and hypoxia. Patient was in significant respiratory distress, diaphoretic, tachycardic. Family at bedside wants everything done. Patient was immedialty Intubated. Taken to cath lab post intubation  Note patient expired 03/01/17.   SUBJECTIVE: Patient expired  VITAL SIGNS: BP (!) 87/69   Pulse 93   Temp (!) 96.6 F (35.9 C)   Resp (!) 25   Ht 5\' 5"  (1.651 m)   Wt 139 lb 5.3 oz (63.2 kg)   SpO2 99%   BMI 23.19 kg/m   HEMODYNAMICS: PAP: (42-60)/(12-36) 52/27 CVP:  [2 mmHg-13 mmHg] 8 mmHg PCWP:  [23 mmHg-27 mmHg] 27 mmHg CO:  [1.9 L/min-3.7 L/min] 1.9 L/min CI:  [1.1 L/min/m2-2.3 L/min/m2] 1.1 L/min/m2  VENTILATOR SETTINGS: Vent Mode: PSV;CPAP FiO2 (%):  [40 %-60 %] 60 % PEEP:  [5 cmH20] 5 cmH20 Pressure Support:  [5 cmH20] 5 cmH20  INTAKE / OUTPUT: I/O last 3 completed shifts: In: 770.7 [P.O.:120; I.V.:625.7; Other:25] Out: [place]  PHYSICAL EXAMINATION: Expired 0730 Mar 01, 2017 with asytole.   LABS:  BMET Recent Labs  Lab 02/09/2017 2051 02/04/2017 2108  02/11/2017 0441 2017-03-01 0212  NA 135 137 133* 137  K 4.4 4.4 4.7 4.9  CL 103 104 103 108  CO2 22  --  20* 17*  BUN 36* 39* 40* 53*  CREATININE 1.56* 1.50* 1.84* 2.75*  GLUCOSE 115* 118* 185* 205*    Electrolytes Recent Labs  Lab 02/25/2017 2051 02/18/2017 0441 01-Mar-2017 0212  CALCIUM 8.9 8.3* 8.2*  MG  --  1.9 2.0  PHOS  --  4.5 6.3*    CBC Recent Labs  Lab 02/02/2017 2051 02/13/2017 2108 02/25/2017 0441 03/01/17 0212  WBC 10.5  --  18.4* 16.7*  HGB 10.2* 10.2* 10.6* 10.4*  HCT 31.2* 30.0* 33.2* 31.1*  PLT 289  --  280 287    Coag's Recent Labs  Lab 02/02/2017 2051  APTT 33  INR 1.14    Sepsis Markers Recent Labs  Lab 02/08/2017 0441 01-Mar-2017 0513  LATICACIDVEN 1.2 3.7*  PROCALCITON 0.44  --     ABG Recent Labs  Lab 02/13/2017 0243 02/23/2017 0421 2017/03/01 0230  PHART 7.294* 7.328* 7.226*  PCO2ART 43.8 38.8 37.2  PO2ART 215.0* 97.3 61.0*    Liver Enzymes Recent Labs  Lab 02/25/2017 2051  AST 25  ALT 15  ALKPHOS 59  BILITOT 1.1  ALBUMIN 3.1*    Cardiac Enzymes Recent Labs  Lab 02/25/2017 0441 02/14/2017 1100 2017-03-01 0212  TROPONINI 19.79* >65.00* 49.47*    Glucose Recent Labs  Lab 02/24/2017 0827 01/30/2017 1202 02/06/2017 1557 02/12/2017 1958 02/04/2017 2332 2017/03/01 0412  GLUCAP 118* 108* 105* 86 76 181*  Imaging Dg Chest Port 1 View  Result Date: 03/16/2017 CLINICAL DATA:  Shortness of breath. EXAM: PORTABLE CHEST 1 VIEW COMPARISON:  Chest radiograph yesterday at 0429 hour FINDINGS: Interval removal of endotracheal and enteric tubes. Swan-Ganz catheter from an inferior approach the region of the left pulmonary outflow track, slightly more proximal on prior exam. Unchanged heart size and mediastinal contours. Lower lung volumes with slight worsening and diffuse bilateral perihilar opacities. Development of small pleural effusions. No pneumothorax. IMPRESSION: 1. Lower lung volumes with slight progression of bilateral perihilar opacities, likely  combination of pulmonary edema and pneumonia. Development of small pleural effusions. 2. Swan-Ganz catheter remains in place, tip overlying the left pulmonary outflow tract. Electronically Signed   By: Jeb Levering M.D.   On: 03/16/2017 02:03     STUDIES:  CXR 11/20 > Patchy airspace opacities in the right upper lobe and left mid lung worrisome for pneumonia. Small right pleural effusion. No pulmonary edema. 02/05/2017 right and left heart catheterization per Dr. Peter Martinique three-vessel obstructive coronary disease with diffuse calcification mild pulmonary hypertension good cardiac output Chest x-ray 01/28/2017 bilateral pulmonary infiltrates lower  lobes, support apparatus in adequate position  CULTURES: Blood 11/21 >> Sputum 11/21 >> S. Pneumoniae 11/21 >> U/A 11/21 >>  ANTIBIOTICS: Levaquin 11/21  SIGNIFICANT EVENTS: 11/20 > Presents to ED  March 16, 2017 cardiac arrest asystole not coded per family's request  LINES/TUBES: ETT 11/21 >> 02/05/2017 right femoral PA catheter>>  DISCUSSION: 83 year old female presents with syncope and chest pain, intubated and taken to cath lab.  Expired 2017/03/16  ASSESSMENT / PLAN:  03-16-2017 we will have a discussion with family concerning possible invasive interventions i.e. intubation patient went asystole.  The family had agreed to not doing chest compressions or shocks.  While at the bedside but family requested no further interventions.  Family is at the bedside when the patient expired.  Dr. Ashok Cordia aware and spoke with the family. Note: Prolonged discussion with family concerning alternatives prior to the patient become in asystole.  Richardson Landry Larisa Lanius ACNP Maryanna Shape PCCM Pager [phone] till 1 pm If no answer page [phone] 16-Mar-2017, 7:52 AM

## 2017-02-26 NOTE — Progress Notes (Addendum)
RN assumed assignment at 2320 from previous RN. Upon assessment, pt noted to be diaphoretic, nauseous, cool and clammy to touch. C/O chest pain and SOB. Additionally, pt is demonstrating an increased WOB, O2 at 4L/[place] and pt placed on 50% venti-mask. EKG obtained and different from previous. CCM called and awaiting phone call. RN will continue to monitor.

## 2017-02-26 NOTE — Progress Notes (Signed)
Upon initial assessment, pt was drowsy, but alert and following commands.  Bi-pap in place.  Answering questions approprietly.  Pt's daughters had just stepped out of the room to talk with NP about pt's code status.  Pt's eyes rolled in the back of her head and her monitor rang out asystole with no pulse.  NP and family called back into room immediatly and family made the decision to not code the pt.  Emotional support was provided to pt & family.  Chaplain called and came to bedside immediatly as well.  Pt was pronounced at 0730 by 2 RNs - Adella Hare, RN & Cleotis Nipper, RN.

## 2017-02-26 NOTE — Progress Notes (Signed)
[facility] for heparin Indication: diffuse 3VCAD awaiting surgical consult  Allergies  Allergen Reactions  . Aspirin Swelling    Can not take High dose Asprin (325)  . Atorvastatin Other (See Comments)    LEG CRAMPS  . Bisphosphonates     Contraindicated due to renal disease  . Ceftin [Cefuroxime]     RASH  . Penicillins     rash  . Shellfish Allergy     hives  . Simvastatin Other (See Comments)    LEG CRAMPS  . Zetia [Ezetimibe]     hives    Patient Measurements: Height: 5\' 5"  (165.1 cm) Weight: 139 lb 5.3 oz (63.2 kg) IBW/kg (Calculated) : 57  Vital Signs: Temp: 96.6 F (35.9 C) (11/22 0430) Temp Source: Core (Comment) (11/22 0400) BP: 81/48 (11/22 0530) Pulse Rate: 49 (11/22 0515)  Labs: Recent Labs    02/20/2017 2051 01/27/2017 2108 02/20/2017 0441 02/05/2017 1100 02/05/2017 2015 Mar 18, 2017 0212 03-18-17 0513  HGB 10.2* 10.2* 10.6*  --   --  10.4*  --   HCT 31.2* 30.0* 33.2*  --   --  31.1*  --   PLT 289  --  280  --   --  287  --   APTT 33  --   --   --   --   --   --   LABPROT 14.5  --   --   --   --   --   --   INR 1.14  --   --   --   --   --   --   HEPARINUNFRC  --   --   --   --  <0.10*  --  0.30  CREATININE 1.56* 1.50* 1.84*  --   --  2.75*  --   TROPONINI  --   --  19.79* >65.00*  --  49.47*  --     Estimated Creatinine Clearance: 13 mL/min (A) (by C-G formula based on SCr of 2.75 mg/dL (H)).   Medical History: Past Medical History:  Diagnosis Date  . Asthma    seasonal  . Breast cancer ([place]) 1991   right  . Carotid artery stenosis 08/2011   40-59% bilateral carotid stenosis by dopplers 08/2016  . CKD (chronic kidney disease), stage III ([place])    from [facility]-Dr. Deterding  . Complication of anesthesia   . Diabetes mellitus ([place])    type 2 w complications of proteinuria and retinopathy  . Difficulty sleeping    due to husbands recent death  . Hypercholesterolemia   . Hypertension   . Mild mitral regurgitation    . Neuropathy   . Osteoarthritis   . Pernicious anemia   . PONV (postoperative nausea and vomiting)   . RLS (restless legs syndrome)    /insomnia-hydroxizine works well..    Infusions:  . sodium chloride 250 mL (02/25/2017 1900)  . sodium chloride    . heparin 900 Units/hr (01/29/2017 2136)  . levofloxacin (LEVAQUIN) IV    . norepinephrine (LEVOPHED) Adult infusion 6 mcg/min (03-18-17 0400)  . ondansetron [facility]) IV      Assessment: 83yo female was started on heparin infusion for USAP vs NSTEMI, decompensated on admission to SDU and sent urgently to cath lab on 11/21 where diffuse 3VCAD was revealed but no clear culprit lesion, awaiting surgical consult on heparin at 900 units/hr -Heparin level: 0.3  Goal of Therapy:  Heparin level 0.3-0.7 units/ml Monitor platelets by anticoagulation protocol: Yes  Plan:  -Cont heparin at 900 units/hr -1400 HL  Narda Bonds, PharmD, BCPS Clinical Pharmacist Phone: [phone]

## 2017-02-26 NOTE — Progress Notes (Signed)
Spoke with Dr. Elsworth Soho (CCM) pt continues to be anxious and c/o nausea. New orders received and noted.

## 2017-02-26 NOTE — Death Summary Note (Signed)
DEATH NOTE: The patient was an 83 y.o. female. For a complete accounting of the patient's history and physical exam on presentation please refer to the H&P dictated on 02/24/2017. She had a known history of multiple medical problems and presented to the emergency department after a syncopal episode at home. Patient had endorsed shortness of breath and cough for 2 weeks prior to admission. She was treated with a course of antibiotics and started on a second course the day prior to presenting. Patient did have nausea with emesis one hour after taking her new antibiotic. Per report the patient had lost consciousness for a brief period of time and had no bowel or bladder incontinence. Family also endorsed exertional discomfort in her chest for approximately 1 month prior. In the emergency department the patient was noted to have a mild troponin elevation and diffuse ST segment depression. Patient was also noted to be hypotensive. Critical care medicine was asked to consult as the patient's respiratory status deteriorated and she became more diaphoretic and hypoxic. She was endotracheally intubated and transported from the emergency department to the cath lab. Left heart catheterization revealed 3 vessel obstructive coronary artery disease with diffuse calcification as well as pulmonary hypertension. She was continued on antibiotic coverage for presumed aspiration pneumonia and after evaluation by intensivist extubated on 11/21. Overnight the patient became more unstable requiring Levophed to maintain mean arterial pressure. Patient's respiratory status also worsened with increasing oxygen requirement. She was subsequently placed on noninvasive positive pressure ventilation for respiratory support and given IV Lasix. While nurse practitioner was talking with family regarding goals of care and further clinical deterioration she passed with family at bedside abruptly. Patient's rhythm was asystole. Family agreed to no  resuscitation at the time of passing. Time of death was 7:30 AM on 05-Mar-2017. I offered my condolences to the patient's daughters at bedside. Also offered hospital chaplain for spiritual support which they agreed to.  DIAGNOSES AT DEATH: 1. Acute hypoxic respiratory failure 2. Lactic acidosis 3. Shock 4. Aspiration pneumonia 5. Acute on chronic renal failure stage III 6. Non-ST elevation MI 7. Grade 2 diastolic congestive heart failure 8. Aortic stenosis 9. Hyperlipidemia 10. Essential hypertension 11. COPD 39. Diabetes mellitus 13. Carotid artery stenosis 14. History of breast cancer 15. History of anemia

## 2017-02-26 DEATH — deceased
